# Patient Record
Sex: Female | Born: 1980
Health system: Southern US, Community
[De-identification: ages and names within clinical notes are randomized; demographics above are authoritative.]

## PROBLEM LIST (undated history)

## (undated) ENCOUNTER — Inpatient Hospital Stay (HOSPITAL_COMMUNITY): Payer: Self-pay

## (undated) DIAGNOSIS — D649 Anemia, unspecified: Secondary | ICD-10-CM

## (undated) DIAGNOSIS — R9431 Abnormal electrocardiogram [ECG] [EKG]: Secondary | ICD-10-CM

## (undated) DIAGNOSIS — R768 Other specified abnormal immunological findings in serum: Secondary | ICD-10-CM

## (undated) DIAGNOSIS — F329 Major depressive disorder, single episode, unspecified: Secondary | ICD-10-CM

## (undated) DIAGNOSIS — K219 Gastro-esophageal reflux disease without esophagitis: Secondary | ICD-10-CM

## (undated) DIAGNOSIS — Q244 Congenital subaortic stenosis: Secondary | ICD-10-CM

## (undated) DIAGNOSIS — Z8759 Personal history of other complications of pregnancy, childbirth and the puerperium: Secondary | ICD-10-CM

## (undated) DIAGNOSIS — Z8619 Personal history of other infectious and parasitic diseases: Secondary | ICD-10-CM

## (undated) DIAGNOSIS — I10 Essential (primary) hypertension: Secondary | ICD-10-CM

## (undated) DIAGNOSIS — H9190 Unspecified hearing loss, unspecified ear: Secondary | ICD-10-CM

## (undated) DIAGNOSIS — Z8659 Personal history of other mental and behavioral disorders: Secondary | ICD-10-CM

## (undated) DIAGNOSIS — S322XXA Fracture of coccyx, initial encounter for closed fracture: Secondary | ICD-10-CM

## (undated) DIAGNOSIS — R011 Cardiac murmur, unspecified: Secondary | ICD-10-CM

## (undated) DIAGNOSIS — Z8776 Personal history of (corrected) congenital malformations of integument, limbs and musculoskeletal system: Secondary | ICD-10-CM

## (undated) DIAGNOSIS — H9192 Unspecified hearing loss, left ear: Secondary | ICD-10-CM

## (undated) DIAGNOSIS — F32A Depression, unspecified: Secondary | ICD-10-CM

## (undated) HISTORY — DX: Abnormal electrocardiogram (ECG) (EKG): R94.31

## (undated) HISTORY — DX: Depression, unspecified: F32.A

## (undated) HISTORY — DX: Unspecified hearing loss, left ear: H91.92

## (undated) HISTORY — DX: Personal history of other mental and behavioral disorders: Z86.59

## (undated) HISTORY — DX: Personal history of other infectious and parasitic diseases: Z86.19

## (undated) HISTORY — DX: Cardiac murmur, unspecified: R01.1

## (undated) HISTORY — DX: Major depressive disorder, single episode, unspecified: F32.9

## (undated) HISTORY — DX: Other specified abnormal immunological findings in serum: R76.8

## (undated) HISTORY — PX: CARDIAC SURGERY: SHX584

## (undated) HISTORY — DX: Essential (primary) hypertension: I10

## (undated) HISTORY — PX: EYE SURGERY: SHX253

## (undated) HISTORY — DX: Personal history of other complications of pregnancy, childbirth and the puerperium: Z87.59

## (undated) HISTORY — DX: Gastro-esophageal reflux disease without esophagitis: K21.9

## (undated) HISTORY — DX: Personal history of (corrected) congenital malformations of integument, limbs and musculoskeletal system: Z87.76

---
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
## 2001-06-10 ENCOUNTER — Encounter: Admission: RE | Admit: 2001-06-10 | Discharge: 2001-06-10 | Payer: Self-pay | Admitting: *Deleted

## 2001-06-10 ENCOUNTER — Ambulatory Visit (HOSPITAL_COMMUNITY): Admission: RE | Admit: 2001-06-10 | Discharge: 2001-06-10 | Payer: Self-pay | Admitting: *Deleted

## 2001-06-10 ENCOUNTER — Encounter: Payer: Self-pay | Admitting: *Deleted

## 2001-08-19 ENCOUNTER — Ambulatory Visit (HOSPITAL_COMMUNITY): Admission: RE | Admit: 2001-08-19 | Discharge: 2001-08-19 | Payer: Self-pay | Admitting: *Deleted

## 2002-08-16 ENCOUNTER — Encounter: Payer: Self-pay | Admitting: *Deleted

## 2002-08-16 ENCOUNTER — Encounter: Admission: RE | Admit: 2002-08-16 | Discharge: 2002-08-16 | Payer: Self-pay | Admitting: *Deleted

## 2002-08-16 ENCOUNTER — Ambulatory Visit (HOSPITAL_COMMUNITY): Admission: RE | Admit: 2002-08-16 | Discharge: 2002-08-16 | Payer: Self-pay | Admitting: *Deleted

## 2007-08-31 ENCOUNTER — Ambulatory Visit: Payer: Self-pay | Admitting: Cardiovascular Disease

## 2007-09-21 ENCOUNTER — Ambulatory Visit: Payer: Self-pay

## 2007-09-21 ENCOUNTER — Encounter: Payer: Self-pay | Admitting: Cardiovascular Disease

## 2008-04-19 ENCOUNTER — Other Ambulatory Visit: Admission: RE | Admit: 2008-04-19 | Discharge: 2008-04-19 | Payer: Self-pay | Admitting: Family Medicine

## 2008-11-10 ENCOUNTER — Encounter (INDEPENDENT_AMBULATORY_CARE_PROVIDER_SITE_OTHER): Payer: Self-pay | Admitting: *Deleted

## 2008-12-27 DIAGNOSIS — R079 Chest pain, unspecified: Secondary | ICD-10-CM

## 2008-12-27 DIAGNOSIS — Z8679 Personal history of other diseases of the circulatory system: Secondary | ICD-10-CM

## 2008-12-27 DIAGNOSIS — R011 Cardiac murmur, unspecified: Secondary | ICD-10-CM

## 2008-12-28 ENCOUNTER — Ambulatory Visit: Payer: Self-pay | Admitting: Cardiovascular Disease

## 2008-12-28 DIAGNOSIS — R9431 Abnormal electrocardiogram [ECG] [EKG]: Secondary | ICD-10-CM

## 2008-12-28 HISTORY — DX: Abnormal electrocardiogram (ECG) (EKG): R94.31

## 2009-09-20 ENCOUNTER — Telehealth (INDEPENDENT_AMBULATORY_CARE_PROVIDER_SITE_OTHER): Payer: Self-pay | Admitting: *Deleted

## 2009-10-25 ENCOUNTER — Encounter (INDEPENDENT_AMBULATORY_CARE_PROVIDER_SITE_OTHER): Payer: Self-pay | Admitting: *Deleted

## 2010-07-09 NOTE — Letter (Signed)
Summary: Appointment - Reminder 2  [facility] [employer], Main Office  [address]   [place], [place] [address]   Phone: [phone]  Fax: [phone]     Oct 25, 2009 MRN: [id number]   [name] [address] [place], [place]  [address]   Dear [name],  Our records indicate that it is time to schedule a follow-up appointment with Dr. [name]. It is very important that we reach you to schedule this appointment. We look forward to participating in your health care needs. Please contact us at the number listed above at your earliest convenience to schedule your appointment.  If you are unable to make an appointment at this time, give [place] a call so we can update our records.     Sincerely,   Migdalia Dk [facility] Scheduling Team

## 2010-07-09 NOTE — Progress Notes (Signed)
  Faxed Echo over to [facility] in [place] to fax [phone],CB [phone] [facility]  September 20, 2009 2:39 PM

## 2010-10-08 ENCOUNTER — Telehealth: Payer: Self-pay | Admitting: Cardiovascular Disease

## 2010-10-08 NOTE — Telephone Encounter (Signed)
All Cardiac faxed to [facility] @ [phone]... 10/08/10/km

## 2010-10-22 NOTE — Assessment & Plan Note (Signed)
[facility]                            CARDIOLOGY OFFICE NOTE   [name], [name]                   MRN:          [id number]  DATE:08/31/2007                            DOB:          01/17/1981    Ms. [name] is a 30 year old patient I was asked to see by the front  test.  Apparently she had an appointment to see Dr. Diona Browner today. He  cancelled afternoon office, and the patient was in tears and wanted to  be seen.  She has a history of congenital heart disease and was  previously seen by Lorna Few. Apparently at the age of 47 she had  surgery for subaortic stenosis in [place].  Her mother will fax [place]  the records, but I do not have them now.   The patient has been doing fairly well. About a year ago she did have  some episodes of substernal chest pain that sounded atypical and  muscular. They passed after a week.  There was no associated  diaphoresis, shortness of breath, PND, orthopnea or palpitations.   The patient has not had an echocardiogram since 2003. At that time, EF  was 55-65%.  LV wall thickness was normal. Aortic valve was described as  trileaflet with normal excursion with no stenosis or regurgitation.   The patient has not seen a dentist in a while. Her dentition is in  fairly good shape.  There has been no previous history of SBE.   REVIEW OF SYSTEMS:  The patient's Review of Systems is otherwise  negative.   PAST MEDICAL HISTORY:  Remarkable for:  1. Strabismus surgery.  2. Tubes in her ear.  3. Subaortic stenosis surgery in 1996.   SOCIAL HISTORY:  The patient is a high Engineer, site teaching tenth  grade.  She is engaged to [place], whom she has known for about a year.  He  has an IT job in [employer].  There will be doing some commuting,  and she may move to [place].   She does not smoke or drink.  She is not very active.   FAMILY HISTORY:  Is remarkable for no congenital heart disease  previously. Mom and dad are both alive.  Mother is 32.  Father is 1.   MEDICATIONS:  She does not take any regular medication.   ALLERGIES:  The patient is allergic to CODEINE.   PHYSICAL EXAMINATION:  GENERAL:  Her exam is remarkable for an  overweight white female in no distress.  VITAL SIGNS:  Blood pressure is 130/70, pulse 62 and regular, afebrile,  respiratory rate 14.  HEENT:  Unremarkable.  NECK:  Carotids are without bruit, no lymphadenopathy, thyromegaly or  JVP elevation.  LUNGS:  Clear with good diaphragmatic motion.  No wheezing.  CARDIAC:  There is an S1-S2 with a faint systolic murmur in the left  lateral sternal border.  There is no aortic insufficiency murmur.  PMI  is normal.  Sternum is well healed.  ABDOMEN:  Benign.  Bowel sounds positive.  No AAA, no tenderness, no  hepatosplenomegaly or hepatojugular reflux.  EXTREMITIES:  Distal  pulses intact, no edema.  NEUROLOGIC:  Nonfocal.  SKIN:  Warm and dry.  MUSCULOSKELETAL:  No muscular weakness.   EKG is normal.   IMPRESSION:  1. History of subaortic stenosis with a faint murmur.  I do not think      she has had any recurrence of this.  There is no obvious diastolic      murmur of aortic insufficiency.  She will have a 2-D      echocardiogram.  She will need to see a dentist on a regular basis.  2. Atypical chest pain, doubt significant problem. Seems to be      resolved, sounded musculoskeletal; p.r.n. anti-inflammatories.  3. Previous strabismus surgery, wearing glasses. Follow up with      ophthalmologist.  4. Previous frequent otitis with tubes. Hearing fine.  No evidence of      recurrence; p.r.n. follow up with ENT.   I told [name] that so long as her echocardiogram does not show  significant regurgitation of the aortic valve or regrowth of her  subaortic membrane, that she could be seen in a year.  We will try to  find her a cardiologist in the [facility] area if she ends up  moving out there  with her fiance. However, I am encouraged that her exam  is fairly benign at this time. 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     [name]. [name], MD, [facility]  Electronically Signed    PCN/MedQ  DD: 08/31/2007  DT: 08/31/2007  Job #: [id number]

## 2011-04-24 ENCOUNTER — Emergency Department
Admission: EM | Admit: 2011-04-24 | Discharge: 2011-04-24 | Disposition: A | Discharge status: Home / Self Care | Payer: 59 | Source: Home / Self Care | Attending: Family Medicine | Admitting: Family Medicine

## 2011-04-24 ENCOUNTER — Encounter: Payer: Self-pay | Admitting: *Deleted

## 2011-04-24 DIAGNOSIS — R6889 Other general symptoms and signs: Secondary | ICD-10-CM

## 2011-04-24 DIAGNOSIS — IMO0001 Reserved for inherently not codable concepts without codable children: Secondary | ICD-10-CM

## 2011-04-24 DIAGNOSIS — J111 Influenza due to unidentified influenza virus with other respiratory manifestations: Secondary | ICD-10-CM

## 2011-04-24 DIAGNOSIS — M791 Myalgia, unspecified site: Secondary | ICD-10-CM

## 2011-04-24 DIAGNOSIS — R5383 Other fatigue: Secondary | ICD-10-CM

## 2011-04-24 DIAGNOSIS — R5381 Other malaise: Secondary | ICD-10-CM

## 2011-04-24 HISTORY — DX: Congenital subaortic stenosis: Q24.4

## 2011-04-24 HISTORY — DX: Unspecified hearing loss, unspecified ear: H91.90

## 2011-04-24 LAB — POCT CBC W AUTO DIFF (K'VILLE URGENT CARE)

## 2011-04-24 LAB — POCT INFLUENZA A/B
Influenza A, POC: NEGATIVE
Influenza B, POC: NEGATIVE

## 2011-04-24 NOTE — ED Notes (Signed)
Pt c/o HA x 3 days and joint pain,nausea and chills x 1 day. She has taken IBF and Excedrin, IBF helped with the joint pain.

## 2011-04-24 NOTE — ED Provider Notes (Addendum)
History     CSN: [id number] Arrival date & time: 04/24/2011  2:25 PM   First MD Initiated Contact with Patient 04/24/11 1425      Chief Complaint  Patient presents with  . Headache  . Generalized Body Aches      HPI Comments: Patient complains of onset of headache 3 days ago, followed yesterday by fatigue, chills, and myalgias.  Today she noted arthralgias, improved with ibuprofen.  She denies URI symptoms, GU symptoms.  She has had mild nausea without vomiting or diarrhea.  Last menstrual period normal about two weeks ago. She has not had a flu shot this season.  The history is provided by the patient.    Past Medical History  Diagnosis Date  . Subaortic stenosis     repaired  . Deafness     LT ear since birth     Past Surgical History  Procedure Date  . Cardiac surgery     open heart surgery   . Abdominal surgery     c-section    History reviewed. No pertinent family history.  History  Substance Use Topics  . Smoking status: Never Smoker   . Smokeless tobacco: Not on file  . Alcohol Use: Yes     1 q mth    OB History    Grav Para Term Preterm Abortions TAB SAB Ect Mult Living                  Review of Systems  Constitutional: Positive for chills and fatigue. Negative for fever.  Eyes: Negative.   Respiratory: Negative.   Cardiovascular: Negative.   Gastrointestinal: Positive for nausea. Negative for vomiting and diarrhea.  Genitourinary: Negative.   Musculoskeletal: Positive for arthralgias. Negative for joint swelling.  Skin: Negative.   Neurological: Positive for dizziness and headaches.  Hematological: Negative for adenopathy.    Allergies  Codeine  Home Medications  No current outpatient prescriptions on file.  BP 86/60  Pulse 87  Temp(Src) 98.1 F (36.7 C) (Oral)  Resp 18  Ht 5\' 3"  (1.6 m)  Wt 249 lb 4 oz (113.059 kg)  BMI 44.15 kg/m2  SpO2 100%  LMP 04/10/2011  Physical Exam  Constitutional: She is oriented to person,  place, and time. She appears well-developed and well-nourished. No distress.       Obese  HENT:  Head: Normocephalic and atraumatic.  Right Ear: External ear normal.  Nose: Nose normal.  Mouth/Throat: Oropharynx is clear and moist.       Congenital absence left ear canal.  Eyes: Conjunctivae and EOM are normal. Pupils are equal, round, and reactive to light. Right eye exhibits no discharge. Left eye exhibits no discharge.  Neck: Normal range of motion. Neck supple.  Cardiovascular: Normal rate, regular rhythm and normal heart sounds.   Pulmonary/Chest: Breath sounds normal. She is in respiratory distress. She has no wheezes. She has no rales. She exhibits tenderness.       Chest:  Distinct tenderness to palpation over the mid-sternum.   Abdominal: Soft. Bowel sounds are normal. She exhibits no distension. There is no tenderness. There is no guarding.  Musculoskeletal: She exhibits no edema.  Lymphadenopathy:    She has no cervical adenopathy.  Neurological: She is alert and oriented to person, place, and time.  Skin: Skin is warm and dry.    ED Course  Procedures     Labs Reviewed  POCT INFLUENZA A/B Negative  POCT CBC W AUTO DIFF ([facility])  CBC:  WBC 4.4; LY 23.7; MO 6.1; GR 70.2; Hgb 11.9       1. Myalgia   2. Fatigue   3. Influenza-like illness       MDM  No evidence bacterial infection today.  Suspect viral URI Treat symptomatically for now:  Increase fluid intake, begin expectorant/decongestant, topical decongestant, saline nasal spray/saline irrigation, cough suppressant at bedtime. Recommend flu shot when well. Followup with PCP if not improving 7 to 10 days.         Donna Christen, MD 04/26/11 1610  Donna Christen, MD 04/26/11 9604  Donna Christen, MD 04/26/11 2226

## 2011-11-19 ENCOUNTER — Ambulatory Visit (INDEPENDENT_AMBULATORY_CARE_PROVIDER_SITE_OTHER): Payer: 59 | Admitting: Emergency Medicine

## 2011-11-19 VITALS — BP 113/71 | HR 79 | Temp 98.4°F | Resp 18 | Ht 64.0 in | Wt 257.0 lb

## 2011-11-19 DIAGNOSIS — M6110 Myositis ossificans progressiva, unspecified site: Secondary | ICD-10-CM

## 2011-11-19 DIAGNOSIS — M79609 Pain in unspecified limb: Secondary | ICD-10-CM

## 2011-11-19 DIAGNOSIS — M722 Plantar fascial fibromatosis: Secondary | ICD-10-CM

## 2011-11-19 DIAGNOSIS — M79671 Pain in right foot: Secondary | ICD-10-CM

## 2011-11-19 NOTE — Patient Instructions (Addendum)

## 2011-11-19 NOTE — Progress Notes (Signed)
  Subjective:    Patient ID: [name], female    DOB: 1980/11/17, 31 y.o.   MRN: [id number]  HPI patient enters with pain and swelling of her right foot. She has a lot of pain in her heel with weightbearing through [place] and now this morning is unable to bear weight. She is currently [redacted] weeks pregnant.    Review of Systems she is approximately [redacted] weeks pregnant     Objective:   Physical Exam  There is tenderness over the calcaneus there is tenderness all along the arch of the foot. Is also swelling present over the calcaneus the.      Assessment & Plan:  I suspect this is a plantar fasciitis or worsened by her long walking yesterday. Advised her to get some shoes with good arch support we are going make sure she has crutches she will treated with ice and Tylenol because she is pregnant

## 2011-12-19 LAB — OB RESULTS CONSOLE ABO/RH

## 2011-12-19 LAB — OB RESULTS CONSOLE ANTIBODY SCREEN: Antibody Screen: NEGATIVE

## 2011-12-19 LAB — OB RESULTS CONSOLE HEPATITIS B SURFACE ANTIGEN: Hepatitis B Surface Ag: NEGATIVE

## 2011-12-19 LAB — OB RESULTS CONSOLE RUBELLA ANTIBODY, IGM: Rubella: IMMUNE

## 2011-12-26 ENCOUNTER — Encounter: Payer: Self-pay | Admitting: Certified Nurse Midwife

## 2011-12-26 LAB — US OB TRANSVAGINAL

## 2012-01-05 ENCOUNTER — Other Ambulatory Visit: Payer: Self-pay

## 2012-01-06 ENCOUNTER — Encounter (HOSPITAL_COMMUNITY): Payer: Self-pay | Admitting: Certified Nurse Midwife

## 2012-01-13 ENCOUNTER — Ambulatory Visit (HOSPITAL_COMMUNITY)
Admission: RE | Admit: 2012-01-13 | Discharge: 2012-01-13 | Disposition: A | Discharge status: Home / Self Care | Payer: 59 | Source: Ambulatory Visit | Attending: Certified Nurse Midwife | Admitting: Certified Nurse Midwife

## 2012-01-13 ENCOUNTER — Encounter (HOSPITAL_COMMUNITY): Payer: 59

## 2012-01-13 ENCOUNTER — Encounter (HOSPITAL_COMMUNITY): Payer: Self-pay

## 2012-01-13 DIAGNOSIS — Z8679 Personal history of other diseases of the circulatory system: Secondary | ICD-10-CM

## 2012-01-13 NOTE — Progress Notes (Signed)
MATERNAL FETAL MEDICINE CONSULT  Patient Name: [name] Medical Record Number:  [id number] Date of Birth: [birth year] Requesting Physician Name:  Marlinda Mike, CNM Date of Service: 01/13/2012  Chief Complaint Anti-Fya antibodies  History of Present Illness [name] was seen today secondary to anti-Fya alloimmunization at the request of Marlinda Mike, CNM.  The patient is a 31 y.o. G1P0,at [redacted]w[redacted]d with an EDD of 07/20/2012, Alternate EDD Entry dating method.  She was found to have an anti-Fya antibody titer of 2 on her initial prenatal labs.  She also had anti-Fya antibodies at the beginning of her last pregnancy.  Her husband has been tested and found to have a Fya/Fyb genotype.  An amniocentesis was performed in that pregnancy and her son was Fyb/Fyb indicated no risk of hemolytic disease of the newborn.    [name] was born with several congenital anomalies including left sided facial abnormalities, left sided conductive deafness, subaortic stenosis, and hip dysplasia.  Her husband has unilateral renal agenesis and an isolated patch of ichthyosis on his left leg.  She also had genetic counseling today regarding these issues.  Review of Systems Pertinent items are noted in HPI.  Patient History OB History    Grav Para Term Preterm Abortions TAB SAB Ect Mult Living   2 1 1             # Outc Date GA Lbr Len/2nd Wgt Sex Del Anes PTL Lv   1 TRM 2011 [redacted]w[redacted]d    LTCS      2 CUR               Past Medical History  Diagnosis Date  . Subaortic stenosis     repaired  . Deafness     LT ear since birth     Past Surgical History  Procedure Date  . Cardiac surgery     open heart surgery   . Abdominal surgery     c-section    History   Social History  . Marital Status: Married    Spouse Name: N/A    Number of Children: N/A  . Years of Education: N/A   Social History Main Topics  . Smoking status: Never Smoker   . Smokeless tobacco: Not on file  . Alcohol Use:  Yes     1 q mth  . Drug Use: No  . Sexually Active:    Other Topics Concern  . Not on file   Social History Narrative  . No narrative on file    Family History See HPI.  Physical Examination Vitals:  Pulse 39, BP 101/51, Weight 260 lbs. General appearance - alert, well appearing, and in no distress  Assessment and Recommendations 1.  Anti-Fya alloimmunization.  [name] has an anti-Fya antibody titer of 2 at this time.  As she was also positive for anti-Fya antibodies in the beginning of her last pregnancy, her sensitization likely occurred after a blood transfusion surrounding her open heart surgery at age 29.  As her husband has a single copy of the Fya gene, there is a 50% chance of maternal-fetal incompatibility at this locus.  We discussed the nature of alloimmunization, the potential for significant fetal anemia, amniocentesis for fetal Fy locus genotyping, the monitoring for anemia using MCA doppler, and the treatment of alloimmunization with fetal transfusion.  She had a bad experience surrounding her amniocentesis in her last pregnancy, and wishes to avoid one in this pregnancy if possible.  She wishes to proceed  with serial titers (at least monthly) at this time and will re-consider amniocentesis should her titers begin to rise.  If her titers rise to 16 or greater she will require frequent MCA doppler assessment thoughout pregnancy, beginning everyone other week.  Frequency should be increased to every week if MCA peak systolic velocities begin to increase, suggesting evolving anemia.  If her titers rise to 16 or above she should also begin once or twice weekly fetal testing beginning at 32 weeks, or earlier if evidence of anemia is discovered.  2.  Family History of Congenital Anomalies.  [name] and her husband met with our genetic counselor, Clydie Braun Corneliussen, to discuss these issues in detail.  They do not want to proceed with any genetic testing at this time.  For  full details of that visit please see accompanying documentation.  I spent 30 minutes with [name] today of which 50% was face-to-face counseling.  Rema Fendt, MD

## 2012-01-16 NOTE — Progress Notes (Signed)
Genetic Counseling  High-Risk Gestation Note  Appointment Date:  01/13/2012 Referred By: Marlinda Mike, CNM Date of Birth:  Sep 18, 1980     Pregnancy History: G2P1000 Estimated Date of Delivery: 07/20/12 Estimated Gestational Age: [redacted]w[redacted]d Attending: Rema Fendt, MD    Ms. [name] and her partner, Mr. Sammie Bench, were seen for genetic counseling because of a personal history of congenital anomalies for the patient. She was also seen for Maternal-Fetal Medicine consultation at the time of today's visit given anti-Fya antibodies.     Both family histories were reviewed and found to be contributory for the patient born with multiple congenital anomalies: subaortic stenosis, broken coccyx, hip dislocation, and left ear congenital deafness. She reported that she also has left eye problems, and had a skin tag on the side of her head. The subaortic stenosis was surgically repaired at 31 years of age. She is followed by cardiology every 2 years. She reported that an underlying etiology for her features has not been determined, and she has not had a medical genetics evaluation.  We discussed that each of Ms. Podgorski's features could be separate, isolated occurrences. However, a single underlying etiology is more likely suspected when there are findings affecting multiple body systems. The recurrence risk for the current pregnancy depends upon the specific underlying cause(s) for Ms. Santacroce's features. We discussed multiple possible etiologies including: a chromosome condition; a gene condition which could be sporadic, recessive, dominant, X-linked; a multifactorial cause; or a sporadic cause.  We spent time reviewing genes and chromosomes and various patterns of inheritance. We offered Ms. YULISA CHIRICO the option of a Medical Genetics evaluation for herself to evaluate for a possible underlying cause and recurrence risk assessment for relatives. Ms. Heatwole stated that her  physicians have previously discussed this option with her. She is undecided if this would be of benefit to her right now and declined a referral to Medical Genetics at this time.   Congenital heart defects occur in approximately 1% of pregnancies.  Congenital heart defects may occur due to multifactorial influences, chromosomal abnormalities, genetic syndromes or environmental exposures.  Isolated heart defects are generally multifactorial.  We discussed that recurrence risk for congenital heart defects in offspring of a women with apparently isolated aortic stenosis is approximately 13-18%. However, as discussed, Ms. Hatcher's subaortic stenosis would not likely be considered isolated, given that she had additional congenital findings. We reviewed that recurrence risk in the current pregnancy could range from low (in the case of an underlying sporadic etiology) to up to 50% (in the case of an underlying autosomal dominant condition). Additional information regarding the etiology or etiologies for Ms. Altman's features may alter recurrence risk assessment. In the absence of an identified genetic cause, prenatal diagnosis via amniocentesis would not be informative regarding the features present in Ms. Mcquerry. We discussed that targeted ultrasound and fetal echocardiogram to assess fetal growth, anatomy, and heart in detail are available in the second trimester of the current pregnancy. Targeted ultrasound was planned for 02/17/12. The couple understands that ultrasound cannot diagnose or rule out all birth defects or genetic conditions.   Additionally, the father of the pregnancy reported that he was born with only a right kidney, discovered incidentally at the time of a CT scan at age 37 years. He also reported that he has ichthyosis localized to his right foot and ankle. The unilateral renal agenesis and ichthyosis could be two separate, isolated occurrences, or could be due to a single underlying  etiology.  Renal agenesis (absent kidney) occurs in approximately 1 in 1000 births. It can be isolated or occur as part of an underlying condition. Unilateral renal agenesis may be asymptomatic and discovered incidentally, like Mr. Andrey Campanile. Typically renal agenesis occurs sporadically, though some familial cases have been reported. Thus, in the case of a sporadic occurrence, recurrence risk may be increased for the current pregnancy. We discussed that targeted ultrasound is available to evaluate fetal kidneys. We discussed that ichthyosis describes a group of skin disorders, typically genetic, with the feature of dry, scaling skin. We discussed that ichthyosis has been described to follow multiple forms of inheritance including autosomal dominant, X-linked recessive, and autosomal recessive. We again reviewed these inheritance patterns. In the case of an X-linked recessive form, all daughters of an affected individual would be carriers, and all sons would be unaffected and would not be carriers. For females who are carriers of an X-linked recessive condition, each of their pregnancies has a 1 in 4 chance of the following: 1) a son who is affected, 2) a son who is unaffected, 3) a daughter who is a carrier, 70) a daughter who is not a carrier. In the case of an autosomal dominant form of ichthyosis, each pregnancy would have a 1 in 2 (50%) chance to inherit the condition. In the case of an autosomal recessive form, all offspring of an affected individual would be obligate carriers, and the risk to inherit the condition would depend upon carrier status of the other parent. In the case of an autosomal recessive form for Mr. Andrey Campanile, recurrence risk for the current pregnancy would likely be low, given Ms. Lanese would have the general population carrier frequency (which varies with each condition) due to no known family history of ichthyosis for her and no consanguinity to Mr. Andrey Campanile. In the absence of an identified  cause, prenatal diagnosis via amniocentesis would not likely be informative regarding ichthyosis in the current pregnancy.    The father of the pregnancy also reported a female maternal first cousin with cleft lip. This individual is otherwise healthy. We discussed that cleft lip +/- cleft palate can be syndromic or isolated.  If the patient's relative has a syndromic form of clefting, the chance of having an affected child depends on the inheritance pattern of that condition.  If the patient's relative has an isolated form of clefting, we discussed the probable multifactorial inheritance and explained that genetic testing for isolated cleft lip +/- cleft palate is not currently available.  Based on the family history, this couple's chance to have a baby with an isolated cleft lip +/- cleft palate is not expected to increased above the general population risk. Without further information regarding the provided family history, an accurate genetic risk cannot be calculated. Further genetic counseling is warranted if more information is obtained.  We reviewed available screening options for the pregnancy including First screen, Quad screen, and detailed ultrasound. They understand that screening tests are used to modify a patient's a priori risk for aneuploidy, typically based on age.  This estimate provides a pregnancy specific risk assessment.  Ms. SHAUNIECE KWAN declined First trimester screening, stating that they were not interested in additional screening for fetal aneuploidy in the pregnancy. Given the family history, detailed ultrasound is available in the second trimester. This was scheduled for 02/17/2012.   Ms. NATURE KUEKER was provided with written information regarding cystic fibrosis (CF) including the carrier frequency and incidence in the Caucasian population, the availability of carrier testing  and prenatal diagnosis if indicated.  In addition, we discussed that CF is routinely screened  for as part of the [place] newborn screening panel.  She declined testing today.   Ms. HADIYA SPOERL denied exposure to environmental toxins or chemical agents. She denied the use of alcohol, tobacco or street drugs. She denied significant viral illnesses during the course of her pregnancy. Her medical and surgical histories were additionally contributory for anti Duffy antibodies. She was seen for Maternal-Fetal Medicine consultation at the time of today's visit regarding this history. Ms. Burback inquired about whether cell free fetal DNA testing was able to assess for fya/fyb (Duffy) status. Discussed that cell free fetal DNA testing assess for Rh status but is not currently available in the [place] to assess for other red blood cell antigens. Please see separately consult note for detailed discussion.    I counseled this couple regarding the above risks and available options.  The approximate face-to-face time with the genetic counselor was 30 minutes.  Quinn Plowman, MS,  Certified Genetic Counselor 01/19/2012

## 2012-02-17 ENCOUNTER — Ambulatory Visit (HOSPITAL_COMMUNITY)
Admission: RE | Admit: 2012-02-17 | Discharge: 2012-02-17 | Disposition: A | Discharge status: Home / Self Care | Payer: 59 | Source: Ambulatory Visit | Attending: Certified Nurse Midwife | Admitting: Certified Nurse Midwife

## 2012-02-17 ENCOUNTER — Other Ambulatory Visit: Payer: Self-pay

## 2012-02-17 DIAGNOSIS — O360990 Maternal care for other rhesus isoimmunization, unspecified trimester, not applicable or unspecified: Secondary | ICD-10-CM

## 2012-02-17 DIAGNOSIS — O361190 Maternal care for Anti-A sensitization, unspecified trimester, not applicable or unspecified: Secondary | ICD-10-CM | POA: Insufficient documentation

## 2012-02-17 DIAGNOSIS — Z8679 Personal history of other diseases of the circulatory system: Secondary | ICD-10-CM

## 2012-02-17 DIAGNOSIS — Z1389 Encounter for screening for other disorder: Secondary | ICD-10-CM | POA: Insufficient documentation

## 2012-02-17 DIAGNOSIS — O358XX0 Maternal care for other (suspected) fetal abnormality and damage, not applicable or unspecified: Secondary | ICD-10-CM | POA: Insufficient documentation

## 2012-02-17 DIAGNOSIS — O352XX0 Maternal care for (suspected) hereditary disease in fetus, not applicable or unspecified: Secondary | ICD-10-CM | POA: Insufficient documentation

## 2012-02-17 DIAGNOSIS — Z363 Encounter for antenatal screening for malformations: Secondary | ICD-10-CM | POA: Insufficient documentation

## 2012-02-17 NOTE — Progress Notes (Signed)
[name]  was seen today for an ultrasound appointment.  See full report in [facility].  Alpha Gula, MD  The patient was counseled about the ultrasound today. She was made aware of the technical limitations secondary to maternal obesity, and fetal position.  She was counseled again about allommunization, and the risk to the fetus. Risk, benefits, and alternatives regarding amniocentesis, serial titters, and MCA Doppler were reviewed. She declined amniocentesis today, with the option to reconsider at any time. She desires to have serial titters, with continued fetal surveillance in the form of MCA Doppler when applicable.   Single living IUP at 18 0/7 weeks. Normal growth for gestational age. Normal amniotic fluid index. Limited anatomy.  Recommend follow up ultrasound in 4 weeks.  Recommend fetal echocardiogram.

## 2012-03-02 ENCOUNTER — Other Ambulatory Visit: Payer: Self-pay

## 2012-03-10 ENCOUNTER — Other Ambulatory Visit: Payer: Self-pay

## 2012-03-18 ENCOUNTER — Ambulatory Visit (HOSPITAL_COMMUNITY)
Admission: RE | Admit: 2012-03-18 | Discharge: 2012-03-18 | Disposition: A | Discharge status: Home / Self Care | Payer: 59 | Source: Ambulatory Visit | Attending: Certified Nurse Midwife | Admitting: Certified Nurse Midwife

## 2012-03-18 DIAGNOSIS — Z363 Encounter for antenatal screening for malformations: Secondary | ICD-10-CM | POA: Insufficient documentation

## 2012-03-18 DIAGNOSIS — Z8679 Personal history of other diseases of the circulatory system: Secondary | ICD-10-CM

## 2012-03-18 DIAGNOSIS — Z1389 Encounter for screening for other disorder: Secondary | ICD-10-CM | POA: Insufficient documentation

## 2012-03-18 DIAGNOSIS — O352XX0 Maternal care for (suspected) hereditary disease in fetus, not applicable or unspecified: Secondary | ICD-10-CM | POA: Insufficient documentation

## 2012-03-18 DIAGNOSIS — O358XX0 Maternal care for other (suspected) fetal abnormality and damage, not applicable or unspecified: Secondary | ICD-10-CM | POA: Insufficient documentation

## 2012-03-18 DIAGNOSIS — O361190 Maternal care for Anti-A sensitization, unspecified trimester, not applicable or unspecified: Secondary | ICD-10-CM | POA: Insufficient documentation

## 2012-03-18 DIAGNOSIS — O360990 Maternal care for other rhesus isoimmunization, unspecified trimester, not applicable or unspecified: Secondary | ICD-10-CM

## 2012-03-18 NOTE — Progress Notes (Signed)
Ms. Earwood had an ultrasound appointment today.  Please see [facility] report for details.  Comments An active singleton fetus is observed.  Biometry is appropriate for gestational age.  Amniotic fluid volume is normal. Limited evaluation due to maternal body habitus and fetal position. I readdressed the concept of "critical titer" for antibody associated with alloimmunization.    Impression Active singleton fetus Screening survey of the fetal anatomy was performed and no dysmorphic features are detected on attempted comprehensive evaluation limited by maternal insonating characteristics and fetal position. Evaluation of the fetal heart (ductal arch, aortic arch), diaphragm, and spine are suboptimal and warrant follow up evaluation. 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 Follow up anatomy (ductal arch, aortic arch, spine, diaphragm) and interval growth evaluation by ultrasound was scheduled in 4 weeks 2. Fetal echocardiogram has been recommended and arranged. 3. 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  In my brief review of her electronic medical record, I do not see any evidence of her having one performed.  If there is any abnormality in her (maternal) echocardiogram, I strongly recommend reconsultation with MFM to discuss pregnancy management. 4. Continue monthly titers in context to present antibody that has yet to reach a critical titer. 5.  If a critical titer (at least 1:16) is reached, I recommend reconsultation with MFM and initiation of MCA Dopplers q 1-2 weeks, depending on the measured PSV.  Rogelia Boga, MD, MS, FACOG Assistant Professor Section of Maternal-Fetal Medicine [facility]

## 2012-03-30 ENCOUNTER — Other Ambulatory Visit: Payer: Self-pay | Admitting: Obstetrics & Gynecology

## 2012-04-01 ENCOUNTER — Emergency Department (HOSPITAL_COMMUNITY)
Admission: EM | Admit: 2012-04-01 | Discharge: 2012-04-02 | Disposition: A | Discharge status: Home / Self Care | Payer: 59 | Attending: Emergency Medicine | Admitting: Emergency Medicine

## 2012-04-01 ENCOUNTER — Encounter (HOSPITAL_COMMUNITY): Payer: Self-pay | Admitting: *Deleted

## 2012-04-01 DIAGNOSIS — R002 Palpitations: Secondary | ICD-10-CM | POA: Insufficient documentation

## 2012-04-01 DIAGNOSIS — Z9889 Other specified postprocedural states: Secondary | ICD-10-CM | POA: Insufficient documentation

## 2012-04-01 DIAGNOSIS — H9190 Unspecified hearing loss, unspecified ear: Secondary | ICD-10-CM | POA: Insufficient documentation

## 2012-04-01 NOTE — ED Notes (Signed)
Patient is alert and oriented x3.  She is complaining of palpitations and chest pressure that started yesterday. She has a past cardiac history.  She states that she feels a hard heart beat that she describes as painful  At a pain level of 6 of 10.  She states she does have nausea but also confirms that she is [redacted] weeks pregnant.   She has had this similar feeling but it has only lasted approximately 30 minutes.  Today it continues and did not subside

## 2012-04-02 LAB — CBC WITH DIFFERENTIAL/PLATELET
Basophils Absolute: 0 10*3/uL (ref 0.0–0.1)
Lymphocytes Relative: 25 % (ref 12–46)
Neutro Abs: 9.1 10*3/uL — ABNORMAL HIGH (ref 1.7–7.7)
Platelets: 270 10*3/uL (ref 150–400)
RDW: 14.3 % (ref 11.5–15.5)
WBC: 12.8 10*3/uL — ABNORMAL HIGH (ref 4.0–10.5)

## 2012-04-02 LAB — BASIC METABOLIC PANEL
Chloride: 98 mEq/L (ref 96–112)
GFR calc Af Amer: >90 mL/min (ref >90)
Potassium: 3.8 mEq/L (ref 3.5–5.1)

## 2012-04-02 LAB — TSH: TSH: 3.824 u[IU]/mL (ref 0.350–4.500)

## 2012-04-02 LAB — TROPONIN I: Troponin I: <0.3 ng/mL (ref <0.30)

## 2012-04-02 NOTE — ED Notes (Signed)
Pt states with each hard heart  Beat she also feels burning in mid chest but it also radiates up to right neck and ear area.

## 2012-04-02 NOTE — ED Provider Notes (Signed)
History     CSN: [id number]  Arrival date & time 04/01/12  2342   First MD Initiated Contact with Patient 04/02/12 0236      Chief Complaint  Patient presents with  . Palpitations    (Consider location/radiation/quality/duration/timing/severity/associated sxs/prior treatment) HPI This is a 31 year old female who is about [redacted] weeks pregnant. She has a history of surgically repaired subaortic stenosis. She is here with a one-day history of episodic palpitations. She describes the palpitations as a severe pounding of her heart which she feels as a burning sensation in her chest and into her neck. These episodes occur when she changes her activities such as when she becomes active or when she sits down or lies down to rest. The palpitations lasted several minutes the time. They are not associated with dyspnea, nausea or diaphoresis (she does have nausea associated with her pregnancy but this has not affected by the episodes of palpitations). She is asymptomatic at this time.  She states she's also been having Braxton Hicks contractions but these do not correlate with her palpitations.  Past Medical History  Diagnosis Date  . Subaortic stenosis     repaired  . Deafness     LT ear since birth     Past Surgical History  Procedure Date  . Cardiac surgery     open heart surgery   . Abdominal surgery     c-section    History reviewed. No pertinent family history.  History  Substance Use Topics  . Smoking status: Never Smoker   . Smokeless tobacco: Not on file  . Alcohol Use: Yes     1 q mth    OB History    Grav Para Term Preterm Abortions TAB SAB Ect Mult Living   2 1 1              Review of Systems  All other systems reviewed and are negative.    Allergies  Codeine  Home Medications   Current Outpatient Rx  Name Route Sig Dispense Refill  . PRENATAL MULTIVITAMIN CH Oral Take 1 tablet by mouth daily.      BP 120/59  Pulse 82  Temp 97.8 F (36.6 C) (Oral)   Resp 18  Ht 5\' 3"  (1.6 m)  Wt 264 lb (119.75 kg)  BMI 46.77 kg/m2  SpO2 100%  LMP 04/10/2011  Physical Exam General: Well-developed, well-nourished female in no acute distress; appearance consistent with age of record HENT: Mildly asymmetric fascies; atraumatic Eyes: pupils equal round and reactive to light; extraocular muscles intact Neck: supple Heart: regular rate and rhythm; no murmur Lungs: clear to auscultation bilaterally Abdomen: soft; nondistended; nontender; gravid, consistent with dates Extremities: No deformity; full range of motion; pulses normal; no edema Neurologic: Awake, alert and oriented; motor function intact in all extremities and symmetric; no facial droop Skin: Warm and dry Psychiatric: Normal mood and affect    ED Course  Procedures (including critical care time)     MDM   Nursing notes and vitals signs, including pulse oximetry, reviewed.  Summary of this visit's results, reviewed by myself:  Labs:  Results for orders placed during the hospital encounter of 04/01/12  CBC WITH DIFFERENTIAL      Component Value Range   WBC 12.8 (*) 4.0 - 10.5 K/uL   RBC 3.63 (*) 3.87 - 5.11 MIL/uL   Hemoglobin 10.5 (*) 12.0 - 15.0 g/dL   HCT 40.9 (*) 81.1 - 91.4 %   MCV 86.2  78.0 - 100.0  fL   MCH 28.9  26.0 - 34.0 pg   MCHC 33.5  30.0 - 36.0 g/dL   RDW 16.1  09.6 - 04.5 %   Platelets 270  150 - 400 K/uL   Neutrophils Relative 71  43 - 77 %   Neutro Abs 9.1 (*) 1.7 - 7.7 K/uL   Lymphocytes Relative 25  12 - 46 %   Lymphs Abs 3.2  0.7 - 4.0 K/uL   Monocytes Relative 4  3 - 12 %   Monocytes Absolute 0.5  0.1 - 1.0 K/uL   Eosinophils Relative 0  0 - 5 %   Eosinophils Absolute 0.1  0.0 - 0.7 K/uL   Basophils Relative 0  0 - 1 %   Basophils Absolute 0.0  0.0 - 0.1 K/uL  TROPONIN I      Component Value Range   Troponin I <0.30  <0.30 ng/mL  BASIC METABOLIC PANEL      Component Value Range   Sodium 130 (*) 135 - 145 mEq/L   Potassium 3.8  3.5 - 5.1 mEq/L     Chloride 98  96 - 112 mEq/L   CO2 20  19 - 32 mEq/L   Glucose, Bld 100 (*) 70 - 99 mg/dL   BUN 10  6 - 23 mg/dL   Creatinine, Ser 4.09 (*) 0.50 - 1.10 mg/dL   Calcium 9.4  8.4 - 81.1 mg/dL   GFR calc non Af Amer >90  >90 mL/min   GFR calc Af Amer >90  >90 mL/min    EKG Interpretation:  Date & Time: 04/02/2012 2:37 AM  Rate: 80  Rhythm: normal sinus rhythm  QRS Axis: normal  Intervals: normal  ST/T Wave abnormalities: normal  Conduction Disutrbances:none  Narrative Interpretation:   Old EKG Reviewed: Q wave in lead 3 not seen previously  5:03 AM Asymptomatic in ED. Patient advised that TSH is pending and her OB/GYN should followup on this.        Hanley Seamen, MD 04/02/12 [phone]

## 2012-04-02 NOTE — ED Notes (Signed)
Pt has had episodes of strong heart beat in the past but has become more frequent in the past week. Pt states yesterday it began to feel worse. She states [redacted] weeks pregnant and is also having Braxton Hicks  Contractions and along with the strong heart beat it is making her nervous.  Pt states this is not her first pregnancy and with the previous pregnancy had palpitations but this is different.

## 2012-04-09 ENCOUNTER — Ambulatory Visit (INDEPENDENT_AMBULATORY_CARE_PROVIDER_SITE_OTHER): Payer: 59 | Admitting: Internal Medicine

## 2012-04-09 ENCOUNTER — Ambulatory Visit (HOSPITAL_COMMUNITY): Payer: 59 | Attending: Cardiology | Admitting: Radiology

## 2012-04-09 ENCOUNTER — Other Ambulatory Visit: Payer: Self-pay

## 2012-04-09 VITALS — BP 108/56 | HR 98 | Ht 63.0 in | Wt 265.0 lb

## 2012-04-09 DIAGNOSIS — R011 Cardiac murmur, unspecified: Secondary | ICD-10-CM | POA: Insufficient documentation

## 2012-04-09 DIAGNOSIS — R079 Chest pain, unspecified: Secondary | ICD-10-CM

## 2012-04-09 DIAGNOSIS — R5383 Other fatigue: Secondary | ICD-10-CM | POA: Insufficient documentation

## 2012-04-09 DIAGNOSIS — R072 Precordial pain: Secondary | ICD-10-CM

## 2012-04-09 DIAGNOSIS — Z331 Pregnant state, incidental: Secondary | ICD-10-CM | POA: Insufficient documentation

## 2012-04-09 DIAGNOSIS — I517 Cardiomegaly: Secondary | ICD-10-CM | POA: Insufficient documentation

## 2012-04-09 DIAGNOSIS — R5381 Other malaise: Secondary | ICD-10-CM | POA: Insufficient documentation

## 2012-04-09 LAB — BASIC METABOLIC PANEL
Chloride: 104 mEq/L (ref 96–112)
Creatinine, Ser: 0.5 mg/dL (ref 0.4–1.2)
Potassium: 4.3 mEq/L (ref 3.5–5.1)

## 2012-04-09 NOTE — Progress Notes (Signed)
Echocardiogram performed by Missy Al-Rammal

## 2012-04-09 NOTE — Progress Notes (Signed)
HPI Patient is a 31 yo who is referred for evaluation of CP She is now in her 2nd pregnancy.  First pregnancy in 2011 she reports having no problems. On 10/24 she says that is when she began to have problems with CP and palpitations.  No dizziness.  Is more SOB  Feels worse when she lies down.  Denies brackish taste in mouth.  Seen in Urgent care and sent home.   She says she doesn't necessarily feel heart beat faster  Just harder.  Symptoms have continued since that visit. She says she does get more SOB with activity now  She has a histroy of congenital Subarortic stenosis.  Corrected in 1996.  Last seen by Dr. Candis Musa in the 200s.   Allergies  Allergen Reactions  . Codeine Hives    Current Outpatient Prescriptions  Medication Sig Dispense Refill  . acetaminophen (TYLENOL) 500 MG tablet Take 500 mg by mouth. As needed for pain      . calcium carbonate (TUMS - DOSED IN MG ELEMENTAL CALCIUM) 500 MG chewable tablet As needed      . ondansetron (ZOFRAN) 4 MG tablet As needed      . Prenatal Vit-Fe Fumarate-FA (PRENATAL MULTIVITAMIN) TABS Take 1 tablet by mouth daily.        Past Medical History  Diagnosis Date  . Subaortic stenosis     repaired  . Deafness     LT ear since birth     Past Surgical History  Procedure Date  . Cardiac surgery     open heart surgery   . Abdominal surgery     c-section    No family history on file.  History   Social History  . Marital Status: Married    Spouse Name: N/A    Number of Children: N/A  . Years of Education: N/A   Occupational History  . Not on file.   Social History Main Topics  . Smoking status: Never Smoker   . Smokeless tobacco: Not on file  . Alcohol Use: Yes     1 q mth  . Drug Use: No  . Sexually Active:    Other Topics Concern  . Not on file   Social History Narrative  . No narrative on file    Review of Systems:  All systems reviewed.  They are negative to the above problem except as previously  stated.  Vital Signs: BP 108/56  Pulse 98  Ht 5\' 3"  (1.6 m)  Wt 265 lb (120.203 kg)  BMI 46.94 kg/m2  LMP 04/10/2011  Physical Exam Patient is in NAD HEENT:  Normocephalic, atraumatic. EOMI, PERRLA.  Neck: JVP is normal.  No bruits.  Lungs: clear to auscultation. No rales no wheezes.  Heart: Regular rate and rhythm. Normal S1, S2. No S3.  Gr II/VI systolic murmur at LSB to base. PMI not displaced.  Abdomen:  Distended  Supple, nontender. Normal bowel sounds.No hepatomegaly.  Extremities:   Good distal pulses throughout. No lower extremity edema.  Musculoskeletal :moving all extremities.  Neuro:   alert and oriented x3.  CN II-XII grossly intact.  EKG:  SR 85.   Assessment and Plan:  1.  CP  I am not sure what this represents  IT is often with a heavier heart beat.  I am not convinced discomfort is cardiac in origin.  Question GI   I would recomm an echo to define LVOT and AV and LVEF.  I have also recomm that she take  an acid inhibitor empirically.  2.  Congenital heart disease  Recomm echo.    Take actvity as tolerated. Stay hydrated  Rest.

## 2012-04-09 NOTE — Patient Instructions (Addendum)
Your physician has requested that you have an echocardiogram. Echocardiography is a painless test that uses sound waves to create images of your heart. It provides your doctor with information about the size and shape of your heart and how well your heart's chambers and valves are working. This procedure takes approximately one hour. There are no restrictions for this procedure.  Lab work today We will call you with results. 

## 2012-04-15 ENCOUNTER — Other Ambulatory Visit (HOSPITAL_COMMUNITY): Payer: Self-pay | Admitting: Obstetrics and Gynecology

## 2012-04-15 ENCOUNTER — Other Ambulatory Visit (HOSPITAL_COMMUNITY): Payer: Self-pay | Admitting: Certified Nurse Midwife

## 2012-04-15 DIAGNOSIS — Z8679 Personal history of other diseases of the circulatory system: Secondary | ICD-10-CM

## 2012-04-16 ENCOUNTER — Other Ambulatory Visit (HOSPITAL_COMMUNITY): Payer: Self-pay | Admitting: Certified Nurse Midwife

## 2012-04-16 DIAGNOSIS — Z8679 Personal history of other diseases of the circulatory system: Secondary | ICD-10-CM

## 2012-04-19 ENCOUNTER — Ambulatory Visit (HOSPITAL_COMMUNITY)
Admission: RE | Admit: 2012-04-19 | Discharge: 2012-04-19 | Disposition: A | Discharge status: Home / Self Care | Payer: 59 | Source: Ambulatory Visit | Attending: Certified Nurse Midwife | Admitting: Certified Nurse Midwife

## 2012-04-19 VITALS — BP 114/59 | HR 85 | Wt 266.5 lb

## 2012-04-19 DIAGNOSIS — O361190 Maternal care for Anti-A sensitization, unspecified trimester, not applicable or unspecified: Secondary | ICD-10-CM | POA: Insufficient documentation

## 2012-04-19 DIAGNOSIS — O352XX0 Maternal care for (suspected) hereditary disease in fetus, not applicable or unspecified: Secondary | ICD-10-CM | POA: Insufficient documentation

## 2012-04-19 DIAGNOSIS — Z8679 Personal history of other diseases of the circulatory system: Secondary | ICD-10-CM

## 2012-04-19 NOTE — Progress Notes (Signed)
STASHA NARAINE  was seen today for an ultrasound appointment.  See full report in [facility].  Comments: Ms. 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  The patient has a personal history of subaortic stenosis which was repaired as a child.  She has a scheduled fetal echo today.  Impression: Single IUP at 26 6/7 weeks Interval growth is appropriate (56th %tile) Normal anatomic fetal survey; however, somewhat limited views of the spine and heart were obtained (ductus, aortic arch) Norma amniotic fluid volume  Recommendations: Recommend continued monthly Fya titers.  Will need to begin MCA Doppler studies if Fya titer > 16. Fetal echo today Recommend follow up ultrasound in 4 weeks.  Alpha Gula, MD

## 2012-04-21 NOTE — Consult Note (Signed)
Dear  Colleagues,  I had the pleasure of seeing [name] for initial consultation in the Fetal Heart Program at [facility]. As you know, she is a 31 y/o G2P1 currently at 27 weeks 0 days gestation referred for fetal cardiovascular evaluation secondary to presence of congenital heart defect in mother.  [name] has history of subaortic stenosis that required surgical repair during her childhood.  But it has not recurred and she had a recent evaluation from her cardiologist showing no current adverse hemodynamic issues.  This pregnancy was conceived naturally. There has been no indication for amniocentesis. She has not experienced any significant illnesses or hospitalizations. She is on prenatal vitamins. She denies any exposure to tobacco, alcohol or recreational drugs. She denies any complaints today.  All systems reviewed and negative.  Aside from [name]'s congenital defect, there is no family history of congenital heart disease, birth defects, genetic abnormalities, arrhythmia, pacemakers or sudden cardiac death.    A complete Fetal Echocardiogram with Doppler assessment was performed in our clinic today, which I personally interpreted and reviewed with [name].  Please refer to a copy of my final report in [facility] if you would like to review further details.  In summary, fetal cardiovascular anatomy and function were normal. No intracardiac abnormalities were identified.  Doppler patterns in the umbilical vessels, ductus venosus and middle cerebral artery were normal. Fetal heart rate and rhythm were also normal with no ectopy or arrhythmia identified.  In summary, I felt that this Fetal Echo was completely normal. These findings were discussed with [name].  I also told her that fetal echocardiography may not be able to detect minor abnormalities such as small ventricular septal defects, subtle valve abnormalities or mild coarctation of the aorta. Nor can it  predict postnatal persistence of normal fetal structures such as a patent foramen ovale or patent ductus arteriosus.  I do not feel that [name] requires a follow-up Fetal Echocardiogram in our [facility] Fetal Heart Clinic here at [facility].  But I am glad to see her back if you have any persistent concerns or if new clinical problems arise. I also do not feel that an echocardiogram is necessary after delivery unless indicated by clinical conditions. Please do not hesitate to contact me with any questions or concerns.  Thank you for involving me in the care of your patient today.   Sincerely,  Massie Bougie, MD Pediatric Cardiology [facility]  Total Time: 30 minues. Time of Counseling/Coordinating care: 20 minutes.

## 2012-04-23 ENCOUNTER — Institutional Professional Consult (permissible substitution): Payer: 59 | Admitting: Cardiovascular Disease

## 2012-05-19 ENCOUNTER — Encounter (HOSPITAL_COMMUNITY): Payer: Self-pay

## 2012-05-19 ENCOUNTER — Ambulatory Visit (HOSPITAL_COMMUNITY)
Admission: RE | Admit: 2012-05-19 | Discharge: 2012-05-19 | Disposition: A | Discharge status: Home / Self Care | Payer: 59 | Source: Ambulatory Visit | Attending: Certified Nurse Midwife | Admitting: Certified Nurse Midwife

## 2012-05-19 VITALS — BP 109/59 | HR 89 | Wt 264.5 lb

## 2012-05-19 DIAGNOSIS — O361190 Maternal care for Anti-A sensitization, unspecified trimester, not applicable or unspecified: Secondary | ICD-10-CM | POA: Insufficient documentation

## 2012-05-19 DIAGNOSIS — Z8679 Personal history of other diseases of the circulatory system: Secondary | ICD-10-CM

## 2012-05-19 DIAGNOSIS — O352XX0 Maternal care for (suspected) hereditary disease in fetus, not applicable or unspecified: Secondary | ICD-10-CM | POA: Insufficient documentation

## 2012-06-17 ENCOUNTER — Telehealth: Payer: Self-pay | Admitting: Internal Medicine

## 2012-06-17 NOTE — Telephone Encounter (Signed)
Spoke with French Ana at OB/GYN office. Patient is [redacted] weeks pregnant. They want to know if  #1 Needs SBE during delivery ? #2 Can she attempt a regular vaginal delivery ( will this place too much strain on her heart)?  Advised will ask Dr.Ross and call her back tomorrow.

## 2012-06-17 NOTE — Telephone Encounter (Signed)
Left message for call back.

## 2012-06-17 NOTE — Telephone Encounter (Signed)
New Problem:    Called in with a question about the patient.  Please call back.

## 2012-06-21 NOTE — Telephone Encounter (Signed)
Please fax to [facility] at [facility]  Send with  report of recent echo 1.  Patient does not need antibiotic prophylaxis  2.  Echo with normal LV function.  No signif ouflow obstruction from LV  AV without stenosis. Patient with no cardiac limitation for normal vaginal delivery.

## 2012-06-22 NOTE — Telephone Encounter (Signed)
I will forward this message to Dr Tenny Craw to review and make further recommendations.

## 2012-06-22 NOTE — Telephone Encounter (Signed)
Left message for tracy to call

## 2012-06-22 NOTE — Telephone Encounter (Signed)
Spoke with tracy at [facility]. Aware of dr Tenny Craw recommendations. Will fax to [phone].

## 2012-07-22 ENCOUNTER — Telehealth (HOSPITAL_COMMUNITY): Payer: Self-pay | Admitting: *Deleted

## 2012-07-22 ENCOUNTER — Encounter (HOSPITAL_COMMUNITY): Payer: Self-pay | Admitting: *Deleted

## 2012-07-22 NOTE — Telephone Encounter (Signed)
Preadmission screen  

## 2012-07-23 ENCOUNTER — Telehealth (HOSPITAL_COMMUNITY): Payer: Self-pay | Admitting: *Deleted

## 2012-07-23 NOTE — Telephone Encounter (Signed)
Preadmission screen  

## 2012-07-24 ENCOUNTER — Other Ambulatory Visit: Payer: Self-pay

## 2012-07-28 ENCOUNTER — Inpatient Hospital Stay (HOSPITAL_COMMUNITY)
Admission: AD | Admit: 2012-07-28 | Discharge: 2012-07-30 | DRG: 765 | Disposition: A | Discharge status: Home / Self Care | Payer: 59 | Source: Ambulatory Visit | Attending: Obstetrics and Gynecology | Admitting: Obstetrics and Gynecology

## 2012-07-28 ENCOUNTER — Inpatient Hospital Stay (HOSPITAL_COMMUNITY): Payer: 59 | Admitting: Anesthesiology

## 2012-07-28 ENCOUNTER — Other Ambulatory Visit: Payer: Self-pay | Admitting: Obstetrics and Gynecology

## 2012-07-28 ENCOUNTER — Encounter (HOSPITAL_COMMUNITY)
Admission: AD | Disposition: A | Discharge status: Home / Self Care | Payer: Self-pay | Source: Ambulatory Visit | Attending: Obstetrics and Gynecology

## 2012-07-28 ENCOUNTER — Encounter (HOSPITAL_COMMUNITY): Payer: Self-pay | Admitting: General Surgery

## 2012-07-28 ENCOUNTER — Encounter (HOSPITAL_COMMUNITY): Payer: Self-pay | Admitting: Anesthesiology

## 2012-07-28 DIAGNOSIS — R079 Chest pain, unspecified: Secondary | ICD-10-CM

## 2012-07-28 DIAGNOSIS — R5383 Other fatigue: Secondary | ICD-10-CM

## 2012-07-28 DIAGNOSIS — D62 Acute posthemorrhagic anemia: Secondary | ICD-10-CM | POA: Diagnosis present

## 2012-07-28 DIAGNOSIS — R9431 Abnormal electrocardiogram [ECG] [EKG]: Secondary | ICD-10-CM

## 2012-07-28 DIAGNOSIS — O34219 Maternal care for unspecified type scar from previous cesarean delivery: Secondary | ICD-10-CM | POA: Diagnosis present

## 2012-07-28 DIAGNOSIS — Z8679 Personal history of other diseases of the circulatory system: Secondary | ICD-10-CM

## 2012-07-28 DIAGNOSIS — O3660X0 Maternal care for excessive fetal growth, unspecified trimester, not applicable or unspecified: Secondary | ICD-10-CM | POA: Diagnosis present

## 2012-07-28 DIAGNOSIS — O9902 Anemia complicating childbirth: Secondary | ICD-10-CM | POA: Diagnosis present

## 2012-07-28 DIAGNOSIS — R011 Cardiac murmur, unspecified: Secondary | ICD-10-CM

## 2012-07-28 DIAGNOSIS — O4100X0 Oligohydramnios, unspecified trimester, not applicable or unspecified: Principal | ICD-10-CM | POA: Diagnosis present

## 2012-07-28 LAB — RPR: RPR Ser Ql: NONREACTIVE

## 2012-07-28 LAB — CBC
HCT: 33.4 % — ABNORMAL LOW (ref 36.0–46.0)
Hemoglobin: 10.6 g/dL — ABNORMAL LOW (ref 12.0–15.0)
MCH: 26.8 pg (ref 26.0–34.0)
MCHC: 31.7 g/dL (ref 30.0–36.0)
MCV: 84.6 fL (ref 78.0–100.0)
RBC: 3.95 MIL/uL (ref 3.87–5.11)

## 2012-07-28 SURGERY — Surgical Case
Anesthesia: Spinal | Site: Abdomen | Wound class: Clean Contaminated

## 2012-07-28 MED ORDER — OXYTOCIN 10 UNIT/ML IJ SOLN
40.0000 [IU] | INTRAVENOUS | Status: DC | PRN
  Administered 2012-07-28: 40 [IU] via INTRAVENOUS

## 2012-07-28 MED ORDER — SIMETHICONE 80 MG PO CHEW: 80.0000 mg | CHEWABLE_TABLET | ORAL | Status: DC | PRN

## 2012-07-28 MED ORDER — ONDANSETRON HCL 4 MG/2ML IJ SOLN: 4.0000 mg | Freq: Three times a day (TID) | INTRAMUSCULAR | Status: DC | PRN

## 2012-07-28 MED ORDER — EPHEDRINE SULFATE 50 MG/ML IJ SOLN
INTRAMUSCULAR | Status: DC | PRN
  Administered 2012-07-28 (×2): 10 mg via INTRAVENOUS
  Administered 2012-07-28: 5 mg via INTRAVENOUS
  Administered 2012-07-28 (×5): 10 mg via INTRAVENOUS

## 2012-07-28 MED ORDER — BUPIVACAINE HCL (PF) 0.25 % IJ SOLN
INTRAMUSCULAR | Status: DC | PRN
  Administered 2012-07-28: 10 mL

## 2012-07-28 MED ORDER — LANOLIN HYDROUS EX OINT: 1.0000 "application " | TOPICAL_OINTMENT | CUTANEOUS | Status: DC | PRN

## 2012-07-28 MED ORDER — DEXTROSE 5 % IV SOLN
3.0000 g | INTRAVENOUS | Status: AC
  Administered 2012-07-28: 2 g via INTRAVENOUS
  Filled 2012-07-28: qty 3000

## 2012-07-28 MED ORDER — LACTATED RINGERS IV SOLN
INTRAVENOUS | Status: DC
  Administered 2012-07-28 (×3): via INTRAVENOUS

## 2012-07-28 MED ORDER — SCOPOLAMINE 1 MG/3DAYS TD PT72
1.0000 | MEDICATED_PATCH | TRANSDERMAL | Status: DC
  Administered 2012-07-28: 1.5 mg via TRANSDERMAL

## 2012-07-28 MED ORDER — EPHEDRINE 5 MG/ML INJ
INTRAVENOUS | Status: AC
  Filled 2012-07-28: qty 10

## 2012-07-28 MED ORDER — FAMOTIDINE IN NACL 20-0.9 MG/50ML-% IV SOLN
20.0000 mg | Freq: Once | INTRAVENOUS | Status: AC
  Administered 2012-07-28: 20 mg via INTRAVENOUS
  Filled 2012-07-28: qty 50

## 2012-07-28 MED ORDER — ONDANSETRON HCL 4 MG/2ML IJ SOLN
INTRAMUSCULAR | Status: AC
  Filled 2012-07-28: qty 2

## 2012-07-28 MED ORDER — METHYLERGONOVINE MALEATE 0.2 MG/ML IJ SOLN
0.2000 mg | INTRAMUSCULAR | Status: DC | PRN
Start: 2012-07-28 — End: 2012-07-30

## 2012-07-28 MED ORDER — KETOROLAC TROMETHAMINE 60 MG/2ML IM SOLN
60.0000 mg | Freq: Once | INTRAMUSCULAR | Status: AC | PRN
  Filled 2012-07-28: qty 2

## 2012-07-28 MED ORDER — KETOROLAC TROMETHAMINE 30 MG/ML IJ SOLN: 30.0000 mg | Freq: Four times a day (QID) | INTRAMUSCULAR | Status: DC | PRN

## 2012-07-28 MED ORDER — CEFAZOLIN SODIUM-DEXTROSE 2-3 GM-% IV SOLR
INTRAVENOUS | Status: AC
  Filled 2012-07-28: qty 50

## 2012-07-28 MED ORDER — FENTANYL CITRATE 0.05 MG/ML IJ SOLN
INTRAMUSCULAR | Status: AC
  Filled 2012-07-28: qty 2

## 2012-07-28 MED ORDER — OXYCODONE-ACETAMINOPHEN 5-325 MG PO TABS: 1.0000 | ORAL_TABLET | ORAL | Status: DC | PRN

## 2012-07-28 MED ORDER — BUPIVACAINE HCL (PF) 0.25 % IJ SOLN
INTRAMUSCULAR | Status: AC
  Filled 2012-07-28: qty 30

## 2012-07-28 MED ORDER — DIBUCAINE 1 % RE OINT: 1.0000 "application " | TOPICAL_OINTMENT | RECTAL | Status: DC | PRN

## 2012-07-28 MED ORDER — CITRIC ACID-SODIUM CITRATE 334-500 MG/5ML PO SOLN
ORAL | Status: AC
  Filled 2012-07-28: qty 15

## 2012-07-28 MED ORDER — NALBUPHINE HCL 10 MG/ML IJ SOLN: 5.0000 mg | INTRAMUSCULAR | Status: DC | PRN

## 2012-07-28 MED ORDER — ZOLPIDEM TARTRATE 5 MG PO TABS: 5.0000 mg | ORAL_TABLET | Freq: Every evening | ORAL | Status: DC | PRN

## 2012-07-28 MED ORDER — PHENYLEPHRINE 40 MCG/ML (10ML) SYRINGE FOR IV PUSH (FOR BLOOD PRESSURE SUPPORT)
PREFILLED_SYRINGE | INTRAVENOUS | Status: AC
  Filled 2012-07-28: qty 5

## 2012-07-28 MED ORDER — IBUPROFEN 600 MG PO TABS
600.0000 mg | ORAL_TABLET | Freq: Four times a day (QID) | ORAL | Status: DC
  Administered 2012-07-29 – 2012-07-30 (×6): 600 mg via ORAL
  Filled 2012-07-28 (×6): qty 1

## 2012-07-28 MED ORDER — LACTATED RINGERS IV SOLN
INTRAVENOUS | Status: DC
  Administered 2012-07-29 (×2): via INTRAVENOUS

## 2012-07-28 MED ORDER — WITCH HAZEL-GLYCERIN EX PADS: 1.0000 "application " | MEDICATED_PAD | CUTANEOUS | Status: DC | PRN

## 2012-07-28 MED ORDER — ONDANSETRON HCL 4 MG/2ML IJ SOLN
INTRAMUSCULAR | Status: DC | PRN
  Administered 2012-07-28 (×2): 4 mg via INTRAVENOUS

## 2012-07-28 MED ORDER — SCOPOLAMINE 1 MG/3DAYS TD PT72
MEDICATED_PATCH | TRANSDERMAL | Status: AC
  Filled 2012-07-28: qty 1

## 2012-07-28 MED ORDER — SCOPOLAMINE 1 MG/3DAYS TD PT72
MEDICATED_PATCH | TRANSDERMAL | Status: AC
  Administered 2012-07-28: 1.5 mg via TRANSDERMAL
  Filled 2012-07-28: qty 1

## 2012-07-28 MED ORDER — NALOXONE HCL 0.4 MG/ML IJ SOLN: 0.4000 mg | INTRAMUSCULAR | Status: DC | PRN

## 2012-07-28 MED ORDER — MEPERIDINE HCL 25 MG/ML IJ SOLN: 6.2500 mg | INTRAMUSCULAR | Status: DC | PRN

## 2012-07-28 MED ORDER — PHENYLEPHRINE HCL 10 MG/ML IJ SOLN
INTRAMUSCULAR | Status: DC | PRN
  Administered 2012-07-28 (×3): 80 ug via INTRAVENOUS
  Administered 2012-07-28 (×4): 40 ug via INTRAVENOUS

## 2012-07-28 MED ORDER — FENTANYL CITRATE 0.05 MG/ML IJ SOLN
INTRAMUSCULAR | Status: DC | PRN
  Administered 2012-07-28: 15 ug via INTRATHECAL

## 2012-07-28 MED ORDER — DIPHENHYDRAMINE HCL 50 MG/ML IJ SOLN: 25.0000 mg | INTRAMUSCULAR | Status: DC | PRN

## 2012-07-28 MED ORDER — METHYLERGONOVINE MALEATE 0.2 MG PO TABS: 0.2000 mg | ORAL_TABLET | ORAL | Status: DC | PRN

## 2012-07-28 MED ORDER — OXYTOCIN 10 UNIT/ML IJ SOLN
INTRAMUSCULAR | Status: AC
  Filled 2012-07-28: qty 4

## 2012-07-28 MED ORDER — SENNOSIDES-DOCUSATE SODIUM 8.6-50 MG PO TABS
2.0000 | ORAL_TABLET | Freq: Every day | ORAL | Status: DC
  Administered 2012-07-29: 2 via ORAL

## 2012-07-28 MED ORDER — MORPHINE SULFATE 0.5 MG/ML IJ SOLN
INTRAMUSCULAR | Status: AC
  Filled 2012-07-28: qty 10

## 2012-07-28 MED ORDER — METOCLOPRAMIDE HCL 5 MG/ML IJ SOLN
10.0000 mg | Freq: Three times a day (TID) | INTRAMUSCULAR | Status: DC | PRN
  Administered 2012-07-28: 10 mg via INTRAVENOUS
  Filled 2012-07-28: qty 2

## 2012-07-28 MED ORDER — KETOROLAC TROMETHAMINE 30 MG/ML IJ SOLN
INTRAMUSCULAR | Status: AC
  Administered 2012-07-28: 30 mg
  Filled 2012-07-28: qty 1

## 2012-07-28 MED ORDER — DIPHENHYDRAMINE HCL 25 MG PO CAPS: 25.0000 mg | ORAL_CAPSULE | Freq: Four times a day (QID) | ORAL | Status: DC | PRN

## 2012-07-28 MED ORDER — SCOPOLAMINE 1 MG/3DAYS TD PT72: 1.0000 | MEDICATED_PATCH | Freq: Once | TRANSDERMAL | Status: DC

## 2012-07-28 MED ORDER — IBUPROFEN 600 MG PO TABS: 600.0000 mg | ORAL_TABLET | Freq: Four times a day (QID) | ORAL | Status: DC | PRN

## 2012-07-28 MED ORDER — FENTANYL CITRATE 0.05 MG/ML IJ SOLN: 25.0000 ug | INTRAMUSCULAR | Status: DC | PRN

## 2012-07-28 MED ORDER — CITRIC ACID-SODIUM CITRATE 334-500 MG/5ML PO SOLN: 30.0000 mL | Freq: Once | ORAL | Status: DC

## 2012-07-28 MED ORDER — SODIUM CHLORIDE 0.9 % IJ SOLN: 3.0000 mL | INTRAMUSCULAR | Status: DC | PRN

## 2012-07-28 MED ORDER — TETANUS-DIPHTH-ACELL PERTUSSIS 5-2.5-18.5 LF-MCG/0.5 IM SUSP: 0.5000 mL | Freq: Once | INTRAMUSCULAR | Status: DC

## 2012-07-28 MED ORDER — ONDANSETRON HCL 4 MG/2ML IJ SOLN: 4.0000 mg | INTRAMUSCULAR | Status: DC | PRN

## 2012-07-28 MED ORDER — SIMETHICONE 80 MG PO CHEW
80.0000 mg | CHEWABLE_TABLET | Freq: Three times a day (TID) | ORAL | Status: DC
  Administered 2012-07-29 – 2012-07-30 (×5): 80 mg via ORAL

## 2012-07-28 MED ORDER — ONDANSETRON HCL 4 MG PO TABS: 4.0000 mg | ORAL_TABLET | ORAL | Status: DC | PRN

## 2012-07-28 MED ORDER — MORPHINE SULFATE (PF) 0.5 MG/ML IJ SOLN
INTRAMUSCULAR | Status: DC | PRN
  Administered 2012-07-28: .1 mg via INTRATHECAL

## 2012-07-28 MED ORDER — DIPHENHYDRAMINE HCL 25 MG PO CAPS
25.0000 mg | ORAL_CAPSULE | ORAL | Status: DC | PRN
  Filled 2012-07-28: qty 1

## 2012-07-28 MED ORDER — DIPHENHYDRAMINE HCL 50 MG/ML IJ SOLN: 12.5000 mg | INTRAMUSCULAR | Status: DC | PRN

## 2012-07-28 MED ORDER — BUPIVACAINE IN DEXTROSE 0.75-8.25 % IT SOLN
INTRATHECAL | Status: DC | PRN
  Administered 2012-07-28: 1.4 mL via INTRATHECAL

## 2012-07-28 MED ORDER — MENTHOL 3 MG MT LOZG: 1.0000 | LOZENGE | OROMUCOSAL | Status: DC | PRN

## 2012-07-28 MED ORDER — NALOXONE HCL 1 MG/ML IJ SOLN: 1.0000 ug/kg/h | INTRAMUSCULAR | Status: DC | PRN

## 2012-07-28 MED ORDER — OXYTOCIN 40 UNITS IN LACTATED RINGERS INFUSION - SIMPLE MED: 62.5000 mL/h | INTRAVENOUS | Status: DC

## 2012-07-28 MED ORDER — PRENATAL MULTIVITAMIN CH
1.0000 | ORAL_TABLET | Freq: Every day | ORAL | Status: DC
  Administered 2012-07-29 – 2012-07-30 (×2): 1 via ORAL
  Filled 2012-07-28 (×2): qty 1

## 2012-07-28 MED ORDER — METOCLOPRAMIDE HCL 5 MG/ML IJ SOLN
10.0000 mg | Freq: Once | INTRAMUSCULAR | Status: AC
  Administered 2012-07-28: 10 mg via INTRAVENOUS
  Filled 2012-07-28: qty 2

## 2012-07-28 SURGICAL SUPPLY — 32 items
CLOTH BEACON ORANGE TIMEOUT ST (SAFETY) ×2 IMPLANT
CONTAINER PREFILL 10% NBF 15ML (MISCELLANEOUS) IMPLANT
DRAPE LG THREE QUARTER DISP (DRAPES) ×2 IMPLANT
DRESSING TELFA 8X3 (GAUZE/BANDAGES/DRESSINGS) IMPLANT
DRSG OPSITE POSTOP 4X10 (GAUZE/BANDAGES/DRESSINGS) ×2 IMPLANT
DURAPREP 26ML APPLICATOR (WOUND CARE) ×2 IMPLANT
ELECT REM PT RETURN 9FT ADLT (ELECTROSURGICAL) ×2
ELECTRODE REM PT RTRN 9FT ADLT (ELECTROSURGICAL) ×1 IMPLANT
EXTRACTOR VACUUM M CUP 4 TUBE (SUCTIONS) IMPLANT
GAUZE SPONGE 4X4 12PLY STRL LF (GAUZE/BANDAGES/DRESSINGS) ×4 IMPLANT
GLOVE BIO SURGEON STRL SZ7.5 (GLOVE) ×4 IMPLANT
GOWN PREVENTION PLUS LG XLONG (DISPOSABLE) ×4 IMPLANT
GOWN PREVENTION PLUS XLARGE (GOWN DISPOSABLE) ×2 IMPLANT
KIT ABG SYR 3ML LUER SLIP (SYRINGE) IMPLANT
NEEDLE HYPO 25X1 1.5 SAFETY (NEEDLE) ×2 IMPLANT
NEEDLE HYPO 25X5/8 SAFETYGLIDE (NEEDLE) IMPLANT
NS IRRIG 1000ML POUR BTL (IV SOLUTION) ×2 IMPLANT
PACK C SECTION WH (CUSTOM PROCEDURE TRAY) ×2 IMPLANT
PAD ABD 7.5X8 STRL (GAUZE/BANDAGES/DRESSINGS) ×2 IMPLANT
SLEEVE SCD COMPRESS KNEE MED (MISCELLANEOUS) IMPLANT
STAPLER VISISTAT 35W (STAPLE) ×2 IMPLANT
SUT MNCRL 0 VIOLET CTX 36 (SUTURE) ×2 IMPLANT
SUT MON AB 2-0 CT1 27 (SUTURE) ×2 IMPLANT
SUT MON AB-0 CT1 36 (SUTURE) ×4 IMPLANT
SUT MONOCRYL 0 CTX 36 (SUTURE) ×2
SUT PLAIN 0 NONE (SUTURE) IMPLANT
SUT PLAIN 2 0 XLH (SUTURE) IMPLANT
SYR CONTROL 10ML LL (SYRINGE) ×2 IMPLANT
TAPE CLOTH SURG 4X10 WHT LF (GAUZE/BANDAGES/DRESSINGS) ×2 IMPLANT
TOWEL OR 17X24 6PK STRL BLUE (TOWEL DISPOSABLE) ×6 IMPLANT
TRAY FOLEY CATH 14FR (SET/KITS/TRAYS/PACK) ×2 IMPLANT
WATER STERILE IRR 1000ML POUR (IV SOLUTION) ×2 IMPLANT

## 2012-07-28 NOTE — Anesthesia Preprocedure Evaluation (Addendum)
Anesthesia Evaluation  Patient identified by MRN, date of birth, ID band Patient awake    Reviewed: Allergy & Precautions, H&P , NPO status , Patient's Chart, lab work & pertinent test results, reviewed documented beta blocker date and time   History of Anesthesia Complications Negative for: history of anesthetic complications  Airway Mallampati: II TM Distance: >3 FB Neck ROM: full    Dental  (+) Teeth Intact   Pulmonary neg pulmonary ROS,  breath sounds clear to auscultation        Cardiovascular + Valvular Problems/Murmurs (h/o subaortic stenosis, repaired at age 32, no problems since.  Echo in 11/13 with EF 60%, no LVOT gradient, normal AV) Rhythm:regular Rate:Normal + Systolic murmurs    Neuro/Psych Deaf in left ear negative psych ROS   GI/Hepatic Neg liver ROS, GERD-  Medicated,  Endo/Other  Morbid obesity  Renal/GU negative Renal ROS  negative genitourinary   Musculoskeletal   Abdominal   Peds  Hematology  (+) anemia , Anti-duffy antibodies - awaiting type and crossmatch.  Patient is high PPH.  Per Dr Billy Coast, case is urgent and unable to wait for crossmatched units to be available.   Anesthesia Other Findings Ate pancakes at 9:45 am Will give bicitra, pepcid and reglan  Reproductive/Obstetrics (+) Pregnancy (h/o c/s x1)                         Anesthesia Physical Anesthesia Plan  ASA: III and emergent  Anesthesia Plan: Spinal   Post-op Pain Management:    Induction:   Airway Management Planned:   Additional Equipment:   Intra-op Plan:   Post-operative Plan:   Informed Consent: I have reviewed the patients History and Physical, chart, labs and discussed the procedure including the risks, benefits and alternatives for the proposed anesthesia with the patient or authorized representative who has indicated his/her understanding and acceptance.     Plan Discussed with: CRNA and  Surgeon  Anesthesia Plan Comments:         Anesthesia Quick Evaluation

## 2012-07-28 NOTE — Anesthesia Postprocedure Evaluation (Signed)
  Anesthesia Post-op Note  Patient: [name]  Procedure(s) Performed: Procedure(s) with comments: CESAREAN SECTION (N/A) - Repeat C/S  EDD: 07/20/12  Patient Location: PACU  Anesthesia Type:Spinal  Level of Consciousness: awake, alert  and oriented  Airway and Oxygen Therapy: Patient Spontanous Breathing  Post-op Pain: none  Post-op Assessment: Post-op Vital signs reviewed, Patient's Cardiovascular Status Stable, Respiratory Function Stable, Patent Airway, No signs of Nausea or vomiting, Pain level controlled, No headache, No backache, No residual numbness and No residual motor weakness  Post-op Vital Signs: Reviewed and stable  Complications: No apparent anesthesia complications

## 2012-07-28 NOTE — Anesthesia Procedure Notes (Signed)
Spinal  Patient location during procedure: OR Start time: 07/28/2012 6:30 PM Staffing Performed by: anesthesiologist  Preanesthetic Checklist Completed: patient identified, site marked, surgical consent, pre-op evaluation, timeout performed, IV checked, risks and benefits discussed and monitors and equipment checked Spinal Block Patient position: sitting Prep: site prepped and draped and DuraPrep Patient monitoring: heart rate, cardiac monitor, continuous pulse ox and blood pressure Approach: midline Location: L3-4 Injection technique: single-shot Needle Needle type: Sprotte  Needle gauge: 24 G Needle length: 9 cm Assessment Sensory level: T4 Additional Notes Clear free flow CSF on first attempt.  No paresthesia.  Patient tolerated procedure well with no apparent complications. Jasmine December, MD

## 2012-07-28 NOTE — H&P (Signed)
  HPI: 41 weeks with severe oligo and decels in office. BPP reassuring. Ate at 930 (pancakes) and scheduled for csec.   O: VSS- afebrile PE dictated  CBC    Component Value Date/Time   WBC 10.5 07/28/2012 1625   RBC 3.95 07/28/2012 1625   HGB 10.6* 07/28/2012 1625   HCT 33.4* 07/28/2012 1625   PLT 276 07/28/2012 1625   MCV 84.6 07/28/2012 1625   MCH 26.8 07/28/2012 1625   MCHC 31.7 07/28/2012 1625   RDW 16.0* 07/28/2012 1625   LYMPHSABS 3.2 04/02/2012 0328   MONOABS 0.5 04/02/2012 0328   EOSABS 0.1 04/02/2012 0328   BASOSABS 0.0 04/02/2012 0328    A: 41 weeks Severe Oligo Previous Cesarean Section History of corrected subaortic stenosis  P:  Proceed with csection. Risks vs benefits discussed. Consent signed  Full history and physical dictated.

## 2012-07-28 NOTE — Op Note (Signed)
[name], [name]            ACCOUNT NO.:  [id number]  MEDICAL RECORD NO.:  [id number]  LOCATION:  [facility]                          FACILITY:  [facility]  PHYSICIAN:  Lenoard Aden, M.D.DATE OF BIRTH:  [birth year]  DATE OF PROCEDURE:  07/28/2012 DATE OF DISCHARGE:                              OPERATIVE REPORT   PREOPERATIVE DIAGNOSES: 1. A 41 week intrauterine pregnancy. 2. Severe oligohydramnios. 3. History of fetal heart rate deceleration. 4. Probable large for gestational age. 5. Previous C-section.  POSTOPERATIVE DIAGNOSES: 1. A 41 week intrauterine pregnancy. 2. Severe oligohydramnios. 3. History of fetal heart rate deceleration. 4. Probable large for gestational age. 5. Previous C-section. 6. Thick meconium-stained amniotic fluid  PROCEDURE:  Repeat low segment transverse cesarean section.  SURGEON:  Lenoard Aden, M.D.  ASSISTANT:  Marlinda Mike, C.N.M.  ANESTHESIA:  Spinal and local.  Spinal given by Dana Allan, MD.  ESTIMATED BLOOD LOSS:  300 mL.  COMPLICATIONS:  None.  DRAINS:  Foley.  COUNTS:  Correct.  DISPOSITION:  The patient to recovery in good condition.  Placenta sent to pathology.  BRIEF OPERATIVE NOTE:  The patient was seen in the office today, diagnosed with severe oligohydramnios and AFI of 2.  During ultrasound, decelerations in the fetal heart rate were seen ranging in the 180-150 beat per minute range.  NST was preformed, which had occasional intermittent variable decelerations, but was otherwise reactive, and biophysical profile was 6/10.  The patient was instructed to come to the hospital for continuous monitoring prior to her C-section, which was scheduled for 8 hours subsequent to her last meal(pending Fetal heart rate tracing), which was at 09:30 this morning.  The patient, however, did not come to the hospital at this point(unknown reason), and did not appear for her preop visit and was not monitored due to this confusion prior  to her surgery.   At this time(upon presentation to Same day surgery), decision was made then to proceed with surgery in spite of the fact that she had a positive antibody titer, it was felt that the possible 3-6 hour delay that may be needed to get blood was not a wise decision in lieu of possible nonreassuring fetal heart rate status.  Please note that the fetus was not continuously monitored prior to the C-section due to aforementioned reasons.  At this point, per primary surgeon (Dr. Ulice Bold was made to proceed without awaiting the proper type and crossmatch due to concerns regarding fetal status. Situation discussed with anesthesia. Safety portal issues noted.  The patient's consent was signed.  DESCRIPTION OF PROCEDURE:  The patient was brought to the operating room, where she was administered a spinal anesthetic without complications.  Prepped and draped in the usual sterile fashion.  Foley catheter was placed.  After achieving adequate anesthesia, dilute Marcaine solution was placed.  Pfannenstiel skin incision was made with a scalpel, carried down the fascia, which was nicked in the midline and opened transversely using Mayo scissors.  Rectus muscles were dissected off sharply in the midline.  Peritoneum entered sharply.  The adherence of the bladder flap to the mid lower uterine segment, which were dissected sharply off the lower uterine segment exposing a thinned lower  uterine segment.  At this time, a Sharl Ma hysterotomy incision was made, extended in a cephalad caudad blunt dissection method.  Amniotomy for thick particulate meconium was noted and delivery atraumatically of a full-term living female, with pediatricians in attendance.  Apgars pending, but the baby was vigorous at the time of delivery, and baby was to be going to the newborn nursery based on status at that time.  At this time, the uterus was curetted using a dry lap pack, closed in 2 running imbricating  layers of 0 Monocryl suture.  The tubes and ovaries appeared normal.  Bladder flap was inspected and found to be hemostatic. Urine was clear.  At this time, fascia was then closed using 0 Monocryl continuous running fashion.  Subcutaneous tissue was reapproximated with a 2-0 plain in a continuous running fashion.  Skin was closed using staples.  The patient tolerated the procedure well and was transferred to the recovery room in good condition.        Lenoard Aden, M.D.     RJT/MEDQ  D:  07/28/2012  T:  07/28/2012  Job:  [id number]

## 2012-07-28 NOTE — OR Nursing (Signed)
[id number] mobile

## 2012-07-28 NOTE — Progress Notes (Signed)
Patient ID: [name], female   DOB: 03-18-1981, 31 y.o.   MRN: [id number] Patient seen and examined. Consent witnessed and signed. No changes noted. Update completed.

## 2012-07-28 NOTE — Transfer of Care (Signed)
Immediate Anesthesia Transfer of Care Note  Patient: [name]  Procedure(s) Performed: Procedure(s) with comments: CESAREAN SECTION (N/A) - Repeat C/S  EDD: 07/20/12  Patient Location: PACU  Anesthesia Type:Spinal  Level of Consciousness: awake, alert  and oriented  Airway & Oxygen Therapy: Patient Spontanous Breathing  Post-op Assessment: Report given to PACU RN and Post -op Vital signs reviewed and stable  Post vital signs: Reviewed and stable  Complications: No apparent anesthesia complications

## 2012-07-28 NOTE — Op Note (Signed)
Cesarean section OP note dictated. #[id number]

## 2012-07-29 ENCOUNTER — Encounter (HOSPITAL_COMMUNITY): Payer: Self-pay | Admitting: Obstetrics and Gynecology

## 2012-07-29 LAB — CBC
HCT: 25.6 % — ABNORMAL LOW (ref 36.0–46.0)
Hemoglobin: 8.2 g/dL — ABNORMAL LOW (ref 12.0–15.0)
MCH: 27.3 pg (ref 26.0–34.0)
RBC: 3 MIL/uL — ABNORMAL LOW (ref 3.87–5.11)

## 2012-07-29 MED ORDER — DOCUSATE SODIUM 100 MG PO CAPS
100.0000 mg | ORAL_CAPSULE | Freq: Every day | ORAL | Status: DC
  Administered 2012-07-30: 100 mg via ORAL
  Filled 2012-07-29: qty 1

## 2012-07-29 MED ORDER — LACTATED RINGERS IV BOLUS (SEPSIS): 500.0000 mL | Freq: Once | INTRAVENOUS | Status: DC

## 2012-07-29 MED ORDER — INFLUENZA VIRUS VACC SPLIT PF IM SUSP
0.5000 mL | INTRAMUSCULAR | Status: AC
  Administered 2012-07-30: 0.5 mL via INTRAMUSCULAR
  Filled 2012-07-29 (×2): qty 0.5

## 2012-07-29 MED ORDER — POLYSACCHARIDE IRON COMPLEX 150 MG PO CAPS
150.0000 mg | ORAL_CAPSULE | Freq: Every day | ORAL | Status: DC
  Administered 2012-07-30: 150 mg via ORAL
  Filled 2012-07-29 (×2): qty 1

## 2012-07-29 NOTE — Anesthesia Postprocedure Evaluation (Signed)
  Anesthesia Post-op Note  Patient: [name]  Procedure(s) Performed: Procedure(s) with comments: CESAREAN SECTION (N/A) - Repeat C/S  EDD: 07/20/12  Patient Location: Mother/Baby  Anesthesia Type:Spinal  Level of Consciousness: awake, alert , oriented and patient cooperative  Airway and Oxygen Therapy: Patient Spontanous Breathing  Post-op Pain: mild  Post-op Assessment: Patient's Cardiovascular Status Stable, Respiratory Function Stable, Patent Airway, No signs of Nausea or vomiting, Adequate PO intake and Pain level controlled  Post-op Vital Signs: Reviewed and stable  Complications: No apparent anesthesia complications

## 2012-07-29 NOTE — Progress Notes (Signed)
POSTOPERATIVE DAY # 1 S/P cesarean section     (FYI... DEAF in LEFT ear - approach to talk to patient on RIGHT)   S:         Reports feeling well - just tired from BF most of the night             Tolerating po intake / no nausea this am / no vomiting sine last night/ no flatus / no  BM             Bleeding is light             Pain controlled with long-acting narcotic             Up ad lib / ambulatory/ voiding QS  Newborn breast feeding  / Circumcision planned  O:  VS: BP 92/42  Pulse 72  Temp(Src) 98.5 F (36.9 C) (Oral)  Resp 20  Ht 5\' 3"  (1.6 m)  Wt 122.925 kg (271 lb)  BMI 48.02 kg/m2  SpO2 96%  LMP 10/14/2011   LABS:  Recent Labs  07/28/12 1625 07/29/12 0500  WBC 10.5 10.7*  HGB 10.6* 8.2*  PLT 276 202                           I&O: + 2731             Physical Exam:             Alert and Oriented X3  Lungs: Clear and unlabored  Heart: regular rate and rhythm / no mumurs  Abdomen: soft, non-tender, non-distended, active BS             Fundus: firm, non-tender, Ueven             Dressing pressure dressing intact without drainage           Perineum: no edema  Lochia: light  Extremities: trace edema, no calf pain or tenderness, SCD in place  A:        POD # 1 S/P cesarean section            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

## 2012-07-29 NOTE — H&P (Signed)
[name], [name]            ACCOUNT NO.:  [id number]  MEDICAL RECORD NO.:  [id number]  LOCATION:                                 FACILITY:  PHYSICIAN:  Lenoard Aden, M.D.DATE OF BIRTH:  1981-04-13  DATE OF ADMISSION: DATE OF DISCHARGE:                             HISTORY & PHYSICAL   CHIEF COMPLAINT:  Severe oligohydramnios and variable decelerations for repeat C-section.  HISTORY:  She is a 32 year old white female, G2, P1, at [redacted] weeks gestation with an AFI of 2, and severe oligohydramnios, who presents for repeat C-section today.  MEDICATIONS:  Include Zofran, prenatal vitamins, iron supplements, and Pepcid.  SOCIAL HISTORY:  She is a nonsmoker, nondrinker.  Denies domestic or physical violence.  FAMILY HISTORY:  She has a family history of breast cancer and varicosities.  PAST SURGICAL HISTORY:  Previous C-section x1 in 2011.  Previous spontaneous abortion.  Remarkable for C-section and correction of subaortic stenosis in 1996.  PRENATAL COURSE:  Complicated by appearance of abnormal antibody titers in the form of anti-Duffy antibodies, which has remained at a stable low undetectable (noncritical) titer throughout the course of her pregnancy upon serial repeat consultation done with maternal fetal medicine, with normal ultrasound noted.  PAST MEDICAL HISTORY:  As noted.  Remarkable for correction of subaortic stenosis.  Cardiology consultation reveals no evidence of defect, normal maternal echo in addition to a normal fetal echo have been performed. Normal Cardiology consult.  No recommendation for prophylactic antibiotics or limitations regarding VBAC or surgical issues.  Her prenatal course otherwise uncomplicated.  PHYSICAL EXAMINATION:  GENERAL:  She is an obese white female, in no acute distress. HEENT:  Normal. NECK:  Supple.  Full range of motion. LUNGS:  Clear. HEART:  Regular rhythm. ABDOMEN:  Soft, gravid, nontender.  Estimated fetal weight  8 pounds. Cervix is closed, 70% vertex, -2. EXTREMITIES:  There are no cords. NEUROLOGIC:  Nonfocal. SKIN:  Intact.  IMPRESSION: 1. A 41-week intrauterine pregnancy. 2. Previous cesarean section. 3. Severe oligohydramnios. 4. Previous history of single layer uterine closure. 5. History of anti-Duffy antibodies with stable antibody titer. 6. Body mass index greater than 30.  PLAN:  To proceed with repeat low-segment transverse cesarean section. Risks of anesthesia, infection, bleeding, injury to abdominal organs, and need for repair was discussed.  Delayed versus immediate complications to include bowel and bladder injury noted.  The patient acknowledges and we will proceed.     Lenoard Aden, M.D.     RJT/MEDQ  D:  07/28/2012  T:  07/28/2012  Job:  [id number]

## 2012-07-29 NOTE — Progress Notes (Signed)
Pt had been having BP's in the low 100's-high90's/mid 50's since I received her from PACU. HR in the mid 70's, asymptomatic and stating that she felt fine. The NT went in around 0415 to get her orthostatic VS and try to get her up. Her BP's were lower than before. Low 90's/high 30's. The patient stated she felt dizzy when she tried to walk and was put back in bed. I went into the room and the pt again was asymptomatic and stated that she felt ok. She mentioned that she went through a similar situation with her previous Caesarian Section. Output has been decreased but still with in normal range. MD was notified and new orders written.

## 2012-07-30 MED ORDER — IBUPROFEN 600 MG PO TABS: 600.0000 mg | ORAL_TABLET | Freq: Four times a day (QID) | ORAL | Status: DC | PRN

## 2012-07-30 MED ORDER — POLYSACCHARIDE IRON COMPLEX 150 MG PO CAPS: 150.0000 mg | ORAL_CAPSULE | Freq: Two times a day (BID) | ORAL | Status: DC

## 2012-07-30 MED ORDER — HYDROCODONE-ACETAMINOPHEN 7.5-500 MG PO TABS: 1.0000 | ORAL_TABLET | Freq: Four times a day (QID) | ORAL | Status: DC | PRN

## 2012-07-30 MED ORDER — DSS 100 MG PO CAPS: 100.0000 mg | ORAL_CAPSULE | Freq: Two times a day (BID) | ORAL | Status: DC

## 2012-07-30 NOTE — Progress Notes (Signed)
Patient ID: [name], female   DOB: 03/21/1981, 32 y.o.   MRN: [id number] POD # 2  Subjective: Pt reports feeling well STRONGLY desires early d/c home today Pain controlled with ibuprofen and percocet Tolerating po/Voiding without problems/ No n/v/Flatus pos Activity: out of bed and ambulate Bleeding is light Newborn info:  Information for the patient's newborn:  Ollie, Delano [[id number]]  female Feeding: breast   Objective: VS: Blood pressure 116/78, pulse 64, temperature 97.4 F (36.3 C), temperature source Oral, resp. rate 18, height 5\' 3" , weight 271 lb.   LABS:  Recent Labs  07/28/12 1625 07/29/12 0500  WBC 10.5 10.7*  HGB 10.6* 8.2*  HCT 33.4* 25.6*  PLT 276 202     Physical Exam:  General: alert, cooperative and no distress CV: Regular rate and rhythm Resp: clear Abdomen: soft, nontender, normal bowel sounds; mod dependent edema Incision: covered with occlusive, pressure dressing, along with tegaderm and honey comb dressing Uterine Fundus: firm, below umbilicus, nontender Lochia: minimal Ext: edema +3 pedal and pretib and Homans sign is negative, no sign of DVT    A/P: POD # 2/ G3P2012/ S/P C/Section d/t elective repeat Medical hx significant for subaortic stenosis and + Duffy Antibody Chronic Anemia, super imposed with ABL Anemia; CBC in the office on 08/02/12 Wound recheck 08/02/12 Doing well and stable for early discharge home RX's: Ibuprofen 600mg  po Q 6 hrs prn pain #30 Refill x 1 Percocet 5/325 1 - 2 tabs po every 6 hrs prn pain  #30 No refill Niferex 150mg  po QD/BID #30/#60 Refill x 1 Colace 100mg  po BID prn #60    Signed: Demetrius Revel, MSN, [facility] 07/30/2012, 10:50 AM

## 2012-07-30 NOTE — Discharge Summary (Signed)
Obstetric Discharge Summary Reason for Admission: G2 P1 0 0 1  @ 41 wks for repeat C/S.  Severe Oligohydramnios, AFI 2  Pregnancy complicated by + Duffy antibody  Med hx Subaortic Stenosis repaired with heart surgery  Chronic Anemia  Hearing loss in Left ear  Prenatal Procedures: NST and ultrasound Intrapartum Procedures: cesarean: low cervical, transverse Postpartum Procedures: flu vaccine Complications-Operative and Postpartum: none Hemoglobin  Date Value Range Status  07/29/2012 8.2* 12.0 - 15.0 g/dL Final     DELTA CHECK NOTED     REPEATED TO VERIFY     HCT  Date Value Range Status  07/29/2012 25.6* 36.0 - 46.0 % Final    Physical Exam:  General: alert, cooperative and no distress Lochia: appropriate Uterine Fundus: firm Incision: Covered with occlusive pressure dressing and tegaderm/honeycomb dressing DVT Evaluation: Negative Homan's sign.  Discharge Diagnoses: S/P Repeat C/S for repeat and severe oligohydramnios  Pos Duffy Antibody  ABL Anemia, superimposed with Chronic Anemia  Hx repaired subaortic stenosis   Discharge Information: Date: 07/30/2012 Activity: pelvic rest Diet: routine Medications: PNV, Ibuprofen, Colace, Iron and Vicodin Condition: stable Instructions: refer to practice specific booklet Discharge to: home Follow-up Information   Follow up with Marlinda Mike, CNM In 6 weeks. (Wound care and repeat labs @ 115, 08/02/12)    Contact information:   [address] [place] [address] [phone]       Newborn Data: Live born female on 07/28/12 Birth Weight: 9 lb 1.3 oz (4120 g) APGAR: 8, 9  Home with mother.  Kimmie Doren K 07/30/2012, 11:19 AM

## 2012-08-01 LAB — TYPE AND SCREEN
Antibody Screen: POSITIVE
DAT, IgG: NEGATIVE
Donor AG Type: NEGATIVE
PT AG Type: NEGATIVE
Unit division: 0

## 2012-08-03 ENCOUNTER — Ambulatory Visit (HOSPITAL_COMMUNITY)
Admit: 2012-08-03 | Discharge: 2012-08-03 | Disposition: A | Discharge status: Home / Self Care | Payer: 59 | Attending: Obstetrics and Gynecology | Admitting: Obstetrics and Gynecology

## 2012-08-03 NOTE — Lactation Note (Addendum)
Adult Lactation Consultation Outpatient Visit Note  Patient Name: [name] Date of Birth: 1980/11/16 Gestational Age at Delivery: [redacted]w[redacted]d Type of Delivery:   Breastfeeding History: Frequency of Breastfeeding: Mom reports that [name] feeds constantly during the late afternoon and evening. Nursed at midnight and then 5 am and has not nursed since.  [name] is usually sleepy during the mornings.  Length of Feeding: 30-45 minutes Voids:8  Stools: 4  Supplementing / Method: Pumping:  Type of Pump: Manual   Frequency:  Volume:    Comments: Mom is pumping about once per day when she gets very full to soften breast before nursing. Reports that sometimes she has trouble getting milk out with hand pump so she is doing hand expression.Plans to get DEBP.     Consultation Evaluation:  Initial Feeding Assessment: Pre-feed Weight: 8- 15.0  4054g with hip harness Post-feed Weight: 9- 0.9  4108g  Amount Transferred: 54 cc's Comments: Assisted mom with latch to right breast. Mom reports that she has more trouble getting [name] latched to this breast and that nipple is pretty sore. Raw area noted on upper tip of nipple. Mom reports pain with initial latch but eases off after a few minutes. Reports that this feels like a deeper latch than she has been getting at home. Reviewed wide open mouth and having baby deep onto breast with lips flanged. [name] nursed for 20 minutes with lots of swallows and mom reports that breast feels softer after nursing.  Additional Feeding Assessment: Pre-feed Weight: 9- 0.9  4108g  Post-feed Weight: 9- 2.4  4152 Amount Transferred: 44 cc's Comments: [name] latched well to left breast. Mom reports no pain with latch. Lots of swallows noted. Nursed well for 15 minutes and then sleepy and nonnutritive sucking noted. [name] very relaxed and sleepy after nursing. Mom's nipple round after nursing. Comfort gels given with instructions for pain relief.   Total  Breast milk Transferred this Visit: 98 cc's Total Supplement Given: 0  Additional Interventions: Encouraged mom to wake [name] during the day every 2-3 hours for feedings so he won't be so hungry in the late afternoon. If he goes a longer period between feedings during the night- get some sleep!! Make sure he is staying awake and doing good deep sucks while he is nursing. Try to nurse on both breasts at each feeding. No further questions at present.  Follow-Up To call prn or if needs to make another OP appointment     [name] 08/03/2012, 10:12 AM

## 2012-11-16 ENCOUNTER — Other Ambulatory Visit (HOSPITAL_COMMUNITY): Payer: Self-pay | Admitting: Internal Medicine

## 2012-11-16 DIAGNOSIS — R112 Nausea with vomiting, unspecified: Secondary | ICD-10-CM

## 2012-11-17 ENCOUNTER — Ambulatory Visit (HOSPITAL_COMMUNITY)
Admission: RE | Admit: 2012-11-17 | Discharge: 2012-11-17 | Disposition: A | Discharge status: Home / Self Care | Payer: 59 | Source: Ambulatory Visit | Attending: Internal Medicine | Admitting: Internal Medicine

## 2012-11-17 DIAGNOSIS — R112 Nausea with vomiting, unspecified: Secondary | ICD-10-CM | POA: Insufficient documentation

## 2012-11-17 DIAGNOSIS — K8020 Calculus of gallbladder without cholecystitis without obstruction: Secondary | ICD-10-CM | POA: Insufficient documentation

## 2013-04-14 ENCOUNTER — Other Ambulatory Visit: Payer: Self-pay

## 2014-04-10 ENCOUNTER — Encounter (HOSPITAL_COMMUNITY): Payer: Self-pay | Admitting: Obstetrics and Gynecology

## 2015-06-18 ENCOUNTER — Telehealth: Payer: 59 | Admitting: Family

## 2015-06-18 DIAGNOSIS — J111 Influenza due to unidentified influenza virus with other respiratory manifestations: Secondary | ICD-10-CM | POA: Diagnosis not present

## 2015-06-18 MED ORDER — OSELTAMIVIR PHOSPHATE 75 MG PO CAPS: 75.0000 mg | ORAL_CAPSULE | Freq: Two times a day (BID) | ORAL | Status: DC

## 2015-06-18 MED FILL — TAMIFLU 75 MG GELCAP: 75 | 5 days supply | Qty: 10 | Fill #0

## 2015-06-18 NOTE — Progress Notes (Signed)

## 2015-06-22 DIAGNOSIS — J069 Acute upper respiratory infection, unspecified: Secondary | ICD-10-CM | POA: Diagnosis not present

## 2015-06-22 MED FILL — BENZONATATE 200 MG CAPSULE: 200 | 10 days supply | Qty: 30 | Fill #0

## 2015-06-22 MED FILL — AZITHROMYCIN 250 MG TABLET: 250 | 5 days supply | Qty: 6 | Fill #0

## 2015-11-12 DIAGNOSIS — F419 Anxiety disorder, unspecified: Secondary | ICD-10-CM | POA: Diagnosis not present

## 2015-11-12 DIAGNOSIS — F331 Major depressive disorder, recurrent, moderate: Secondary | ICD-10-CM | POA: Diagnosis not present

## 2015-11-12 MED FILL — VENLAFAXINE HCL ER 37.5 MG: 37.5 | 14 days supply | Qty: 14 | Fill #0

## 2015-11-22 MED FILL — ESCITALOPRAM 10 MG TABLET: 10 | 30 days supply | Qty: 30 | Fill #0

## 2015-12-24 MED FILL — ESCITALOPRAM 10 MG TABLET: 10 | 30 days supply | Qty: 30 | Fill #1

## 2016-01-24 MED FILL — ESCITALOPRAM 10 MG TABLET: 10 | 30 days supply | Qty: 30 | Fill #2

## 2016-03-27 DIAGNOSIS — R635 Abnormal weight gain: Secondary | ICD-10-CM | POA: Diagnosis not present

## 2016-03-27 DIAGNOSIS — F331 Major depressive disorder, recurrent, moderate: Secondary | ICD-10-CM | POA: Diagnosis not present

## 2016-03-27 DIAGNOSIS — Z0000 Encounter for general adult medical examination without abnormal findings: Secondary | ICD-10-CM | POA: Diagnosis not present

## 2016-03-27 DIAGNOSIS — F419 Anxiety disorder, unspecified: Secondary | ICD-10-CM | POA: Diagnosis not present

## 2016-03-27 LAB — HEPATIC FUNCTION PANEL
ALT: 16 (ref 7–35)
AST: 17 (ref 13–35)

## 2016-03-27 LAB — BASIC METABOLIC PANEL
BUN: 14 (ref 4–21)
CREATININE: 0.6 (ref <=1.1)
GLUCOSE: 89
Potassium: 4.4 (ref 3.4–5.3)
SODIUM: 139 (ref 137–147)

## 2016-03-27 LAB — LIPID PANEL
Cholesterol: 155 (ref 0–200)
HDL: 38 (ref 35–70)
LDL CALC: 117
Triglycerides: 134 (ref 40–160)

## 2016-03-27 LAB — CBC AND DIFFERENTIAL
HEMATOCRIT: 34 — AB (ref 36–46)
Hemoglobin: 11.5 — AB (ref 12.0–16.0)
Platelets: 263 (ref 150–399)
WBC: 8.4

## 2016-03-27 LAB — TSH: TSH: 5.14 (ref <=5.90)

## 2016-04-21 MED FILL — ESCITALOPRAM 10 MG TABLET: 10 | 30 days supply | Qty: 30 | Fill #0

## 2016-05-29 MED FILL — ESCITALOPRAM 10 MG TABLET: 10 | 30 days supply | Qty: 30 | Fill #1

## 2016-07-02 MED FILL — ESCITALOPRAM 10 MG TABLET: 10 | 30 days supply | Qty: 30 | Fill #2

## 2016-07-29 DIAGNOSIS — O09291 Supervision of pregnancy with other poor reproductive or obstetric history, first trimester: Secondary | ICD-10-CM | POA: Diagnosis not present

## 2016-07-29 DIAGNOSIS — Z3A01 Less than 8 weeks gestation of pregnancy: Secondary | ICD-10-CM | POA: Diagnosis not present

## 2016-07-29 DIAGNOSIS — Z3201 Encounter for pregnancy test, result positive: Secondary | ICD-10-CM | POA: Diagnosis not present

## 2016-07-30 MED FILL — PROGESTERONE 200 MG CAPSULE: 200 | 30 days supply | Qty: 30 | Fill #0

## 2016-07-31 DIAGNOSIS — O09291 Supervision of pregnancy with other poor reproductive or obstetric history, first trimester: Secondary | ICD-10-CM | POA: Diagnosis not present

## 2016-07-31 DIAGNOSIS — Z3A01 Less than 8 weeks gestation of pregnancy: Secondary | ICD-10-CM | POA: Diagnosis not present

## 2016-08-12 DIAGNOSIS — Z3201 Encounter for pregnancy test, result positive: Secondary | ICD-10-CM | POA: Diagnosis not present

## 2016-08-14 DIAGNOSIS — Z3481 Encounter for supervision of other normal pregnancy, first trimester: Secondary | ICD-10-CM | POA: Diagnosis not present

## 2016-08-14 DIAGNOSIS — Z3689 Encounter for other specified antenatal screening: Secondary | ICD-10-CM | POA: Diagnosis not present

## 2016-08-14 LAB — OB RESULTS CONSOLE RPR: RPR: NONREACTIVE

## 2016-08-14 LAB — OB RESULTS CONSOLE RUBELLA ANTIBODY, IGM: Rubella: IMMUNE

## 2016-08-14 LAB — OB RESULTS CONSOLE HEPATITIS B SURFACE ANTIGEN: HEP B S AG: NEGATIVE

## 2016-08-14 LAB — OB RESULTS CONSOLE ABO/RH: RH TYPE: POSITIVE

## 2016-08-14 LAB — OB RESULTS CONSOLE HIV ANTIBODY (ROUTINE TESTING): HIV: NONREACTIVE

## 2016-08-14 LAB — OB RESULTS CONSOLE ANTIBODY SCREEN: ANTIBODY SCREEN: POSITIVE

## 2016-09-03 MED FILL — PROGESTERONE 200 MG CAPSULE: 200 | 30 days supply | Qty: 30 | Fill #1

## 2016-09-04 DIAGNOSIS — Z3491 Encounter for supervision of normal pregnancy, unspecified, first trimester: Secondary | ICD-10-CM | POA: Diagnosis not present

## 2016-09-04 DIAGNOSIS — O09291 Supervision of pregnancy with other poor reproductive or obstetric history, first trimester: Secondary | ICD-10-CM | POA: Diagnosis not present

## 2016-09-04 DIAGNOSIS — Z3689 Encounter for other specified antenatal screening: Secondary | ICD-10-CM | POA: Diagnosis not present

## 2016-09-04 DIAGNOSIS — Z3A10 10 weeks gestation of pregnancy: Secondary | ICD-10-CM | POA: Diagnosis not present

## 2016-09-04 DIAGNOSIS — Z36 Encounter for antenatal screening for chromosomal anomalies: Secondary | ICD-10-CM | POA: Diagnosis not present

## 2016-09-04 DIAGNOSIS — Z3682 Encounter for antenatal screening for nuchal translucency: Secondary | ICD-10-CM | POA: Diagnosis not present

## 2016-09-24 DIAGNOSIS — Z3A13 13 weeks gestation of pregnancy: Secondary | ICD-10-CM | POA: Diagnosis not present

## 2016-09-24 DIAGNOSIS — Z3689 Encounter for other specified antenatal screening: Secondary | ICD-10-CM | POA: Diagnosis not present

## 2016-09-24 DIAGNOSIS — Z3682 Encounter for antenatal screening for nuchal translucency: Secondary | ICD-10-CM | POA: Diagnosis not present

## 2016-09-24 DIAGNOSIS — Z3481 Encounter for supervision of other normal pregnancy, first trimester: Secondary | ICD-10-CM | POA: Diagnosis not present

## 2016-09-24 DIAGNOSIS — O09291 Supervision of pregnancy with other poor reproductive or obstetric history, first trimester: Secondary | ICD-10-CM | POA: Diagnosis not present

## 2016-09-30 ENCOUNTER — Ambulatory Visit (HOSPITAL_COMMUNITY)
Admission: RE | Admit: 2016-09-30 | Discharge: 2016-09-30 | Disposition: A | Discharge status: Home / Self Care | Payer: 59 | Source: Ambulatory Visit | Attending: Obstetrics and Gynecology | Admitting: Obstetrics and Gynecology

## 2016-09-30 DIAGNOSIS — Z8279 Family history of other congenital malformations, deformations and chromosomal abnormalities: Secondary | ICD-10-CM | POA: Diagnosis not present

## 2016-10-03 ENCOUNTER — Encounter (HOSPITAL_COMMUNITY): Payer: Self-pay | Admitting: MS"

## 2016-10-03 DIAGNOSIS — Z8279 Family history of other congenital malformations, deformations and chromosomal abnormalities: Secondary | ICD-10-CM | POA: Insufficient documentation

## 2016-10-03 NOTE — Progress Notes (Signed)
Genetic Counseling  High-Risk Gestation Note  Appointment Date:  10/01/2016 Referred By: Brien Few, MD Date of Birth:  09-05-1980   Pregnancy History: [id number] Estimated Date of Delivery: 03/27/17 Estimated Gestational Age: 28w5dAttending: PBenjaman Lobe MD   I met with Mrs. Kristen Piquefor genetic counseling because of a personal history of Klippel-Feil syndrome. The patient has been seen for genetic counseling previously, prior to the diagnosis of Klippel-Feil. See previous genetic counseling notes for previous discussion.   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 Obey's report is most suggestive of nonsyndromic, but no formal medical genetics evaluation has been performed to date  Sporadic, Autosomal dominant and autosomal recessive inheritance described  Recurrence risk for current pregnancy could range from low (in case of autosomal recessive or sporadic cause) to 50% (in the case of autosomal dominant cause)  Discussed options of screening / testing  Medical genetics evaluation - patient may be interested in the future but not during pregnancy  Clinical molecular testing available for certain genes associated with Klippel-Feil: testing is best performed in the context of medical genetics evaluation  Detailed ultrasound in pregnancy  Fetal echocardiogram   Cardiology follow-up for patient given her personal history   Patient reported first trimester screening resulted increased risk for Down syndrome and subsequently NIPS (Panorama) [facility]  Documentation of this was not available at time of today's visit  Patient stated not interested in amniocentesis in pregnancy at this time  Discussed general population carrier screening options - declined  CF  SMA  Hemoglobinopathies  We began by  reviewing the family history in detail. Mrs. MRaischwas previously seen for genetic counseling given her medical history which includes left ear congenital deafness, left eye problems, congenital hip dislocation, subaortic stenosis status post surgical repair at age [age]years old. She reported that she was recently diagnosed with Klippel-Feil syndrome. She presented to her chiropractor with dizziness in late 2016 to early 2017, and x ray exam subsequently visualized C2 to C3 and C4-C6 fusion in the cervical spine, consistent with Klippel-Feil syndrome. Family history is also remarkable for update for the couple's younger son, who is currently 490years old, born with congenital hip dysplasia and is followed by a pediatric orthopedist. Mrs. MKilnerreported that her mother also had congenital hip dysplasia.   Klippel-Feil syndrome occurs in approximately 1 in 40,000 births and is characterized by the abnormal fusion of two or more bones in the neck. Individuals with Klippel-Feil syndrome may have a short, webbed neck, decreased range of motion in the head and neck area, and/or a low hairline at the back of the head; while this is reported as the classic triad of Klippel-Feil, approximately 50% or less of patients have all three of these features.  Additionally, other features can be observed, including: facial asymmetry, hearing loss, cleft palate, a Sprengel anomaly (upward displacement of the scapula), fusion in the thoracic spine causing scoliosis and/or kyphosis, and heart disease. We discussed that Klippel-Feil syndrome varies in severity, with some individuals having many features and others having relatively few.   Klippel-Feil syndrome has been observed with sporadic, autosomal dominant, and autosomal recessive inheritance.  It can be syndromic or nonsyndromic. We focused our discussion on nonsyndromic causes, given that Mrs. Roskelley's reported history is not necessarily suggestive of a particular  underlying syndrome at this time. She was  counseled that Klippel-Feil syndrome is most often a dominant condition, caused by a single gene alteration that can be inherited or occur de novo. We reviewed that Klippel-Feil syndrome less commonly occurs due to autosomal recessive inheritance. Klippel-Feil syndrome shows genetic heterogeneity; to date, at least 5 genes, when altered, are known to cause Klippel-Feil syndrome.    We reviewed autosomal dominant inheritance, where offspring of an affected individual have a 1 in 2 (50%) chance to inherit the condition. We also reviewed autosomal recessive inheritance, where offspring of an affected individual have a 1 in 4 (25%) chance to inherit the condition. If Mrs. DARBIE BIANCARDI has autosomal recessive Klippel-Feil syndrome, the risk of recurrence is expected to be low, given that her partner has a negative family history and no consanguinity to her. We discussed that all of her children would be expected to be carriers of the condition, but that the father of the baby would also have to be a carrier of the condition (same gene, same type) in order for the fetus to have an increased risk to inherit the condition. If however, Mrs. SUZANNA ZAHN has autosomal dominant Klippel-Feil syndrome, the risk of recurrence is expected to be 50%. She was counseled that if she has autosomal dominant Klippel-Feil syndrome, she likely has a de novo gene change, given that neither of her parents have apparent clinical features of Klippel-Feil syndrome.   She was counseled that prenatal genetic testing for the current pregnancy is not warranted at this time, considering that Mrs. DONEISHA IVEY has never had genetic testing and a specific gene cause for her Klippel-Feil syndrome is not known. We discussed the option of genetic testing for Mrs. Windhorst and a consultation with a medical geneticist. She is not interested in these options at this time, given that her focus  is care related specific to the current pregnancy, and she would not consider prenatal diagnosis for Klippel-Feil syndrome, even in if a causative genetic variant was identified in her. She stated that she may be interested in pursuing medical genetics evaluation with possible genetic testing in the future, and we can facilitate a referral in the future, if desired. In addition, we discussed the availability of a detailed ultrasound at 18+ weeks and fetal echocardiogram. Ultrasound may detect severe fusion and shortening of the fetal neck; however mild cases may not be detectable by ultrasound. A normal appearing fetal neck by ultrasound would be reassuring, but would not eliminate the possibility of Klippel-Feil syndrome in the fetus.   We reviewed the family history congenital hip dysplasia, or developmental dysplasia of the hip, which describes a spectrum of conditions related to the development of the hip in infants and young children. There are a wide range of contributing factors. Empiric evidence suggests that recurrence risk for developmental dysplasia of the hip is approximately 1-4% in the case of a family history. However, given the reported family history for Ms. Standish, this could be an underestimate of recurrence risk. She stated that their medical providers are aware of this history.   We also briefly reviewed the previously discussed family history for Ms. Ashland's partner, Mr. Robin Searing. See previous genetic counseling note from 01/16/12 for detailed discussion. Ms. Caywood specifically inquired about Mr. Dois Davenport ichthyosis, which is reportedly isolated to his right foot and was present from birth. No additional relatives reportedly have ichthyosis, including the couple's two sons. We reviewed that the majority of ichthyoses are due to genetic factors, but there are also acquired  forms. We briefly reviewed patterns of inheritance and discussed that the more common types of ichthyoses  follow X-linked inheritance but that autosomal recessive forms exist as well. We reviewed that in the case of an X-linked form, all of Mr. Dois Davenport sons would be expected to be unaffected, and all daughters would be obligate carriers. In the case of an autosomal recessive form, recurrence risk would likely be low, given that Ms. Lubin has no reported family history and no known consanguinity to Mr. Redmond Pulling. However, we discussed that recurrence risk estimate may change with additional information regarding Mr. Dois Davenport underlying etiology for ichthyosis.  The family histories were otherwise found to be noncontributory for birth defects, mental retardation, and known genetic conditions. Without further information regarding the provided family history, an accurate genetic risk cannot be calculated. Further genetic counseling is warranted if more information is obtained.  Mrs. Donny Mosley also reported that she had first trimester screening performed through her OB office, which increased the risk for fetal Down syndrome to 1 in 256, and subsequently, Panorama (noninvasive prenatal screening ) was drawn through her OB office. Results are currently reportedly pending of Panorama. We did not have records documenting this information at the time of today's visit. We briefly reviewed benefits and limitations of both first trimester screening and NIPS as screens for fetal aneuploidy. We discussed the sensitivity and false positive rates of NIPS. We also reviewed the benefits and limitations of detailed ultrasound as a screening tool for fetal aneuploidy. She understands that screening tests are used to modify a patient's a priori risk for aneuploidy, typically based on age.  This estimate provides a pregnancy specific risk assessment.  We also reviewed the availability of diagnostic option of amniocentesis.  We discussed the risks, limitations, and benefits of each. Ms. Albany stated that she is not  interested in amniocentesis at this time given the associated risk for complications. If additional consultation is desired regarding these screens and tests and information regarding risk for fetal aneuploidy in the pregnancy, please contact our office to schedule follow-up genetic counseling. She understands that ultrasound and screens cannot rule out all birth defects or genetic syndromes. The patient was advised of this limitation.   Mrs. Donny Mosley was provided with written information regarding cystic fibrosis (CF), spinal muscular atrophy (SMA) and hemoglobinopathies including the carrier frequency, availability of carrier screening and prenatal diagnosis if indicated.  In addition, we discussed that CF and hemoglobinopathies are routinely screened for as part of the [facility] newborn screening panel.  She declined screening for CF, SMA and hemoglobinopathies at this time.  Mrs. THATIANA RENBARGER denied exposure to environmental toxins or chemical agents. She denied the use of alcohol, tobacco or street drugs. She denied significant viral illnesses during the course of her pregnancy. Her medical and surgical histories were contributory for history of congenital heart disease, as previously mentioned. Mrs. Berhe reported that she is seen by [facility] cardiology approximately every 2 years.  She reported that she has not yet been evaluated in the current pregnancy but is likely planning to pursue this. 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 Please contact our office if MFM consultation is desired in the current pregnancy regarding the patient's medical history.   I counseled Mrs MERSADES BARBARO regarding the above risks and available options.  The approximate face-to-face time with the genetic counselor was 50 minutes.  Chipper Oman, MS  Certified [employer] 10/03/2016

## 2016-10-06 DIAGNOSIS — Z3A14 14 weeks gestation of pregnancy: Secondary | ICD-10-CM | POA: Insufficient documentation

## 2016-10-10 ENCOUNTER — Other Ambulatory Visit: Payer: Self-pay

## 2016-10-14 DIAGNOSIS — Z361 Encounter for antenatal screening for raised alphafetoprotein level: Secondary | ICD-10-CM | POA: Diagnosis not present

## 2016-10-30 DIAGNOSIS — Z3A18 18 weeks gestation of pregnancy: Secondary | ICD-10-CM | POA: Diagnosis not present

## 2016-10-30 DIAGNOSIS — O09292 Supervision of pregnancy with other poor reproductive or obstetric history, second trimester: Secondary | ICD-10-CM | POA: Diagnosis not present

## 2016-10-30 DIAGNOSIS — R768 Other specified abnormal immunological findings in serum: Secondary | ICD-10-CM | POA: Diagnosis not present

## 2016-11-11 DIAGNOSIS — Z3A20 20 weeks gestation of pregnancy: Secondary | ICD-10-CM | POA: Diagnosis not present

## 2016-11-11 DIAGNOSIS — O09292 Supervision of pregnancy with other poor reproductive or obstetric history, second trimester: Secondary | ICD-10-CM | POA: Diagnosis not present

## 2016-11-25 DIAGNOSIS — Q761 Klippel-Feil syndrome: Secondary | ICD-10-CM | POA: Insufficient documentation

## 2016-11-25 DIAGNOSIS — I351 Nonrheumatic aortic (valve) insufficiency: Secondary | ICD-10-CM | POA: Insufficient documentation

## 2016-11-25 DIAGNOSIS — Q249 Congenital malformation of heart, unspecified: Secondary | ICD-10-CM | POA: Diagnosis not present

## 2016-11-25 DIAGNOSIS — O99412 Diseases of the circulatory system complicating pregnancy, second trimester: Secondary | ICD-10-CM | POA: Diagnosis not present

## 2016-12-01 DIAGNOSIS — R768 Other specified abnormal immunological findings in serum: Secondary | ICD-10-CM | POA: Diagnosis not present

## 2016-12-27 ENCOUNTER — Encounter (HOSPITAL_COMMUNITY): Payer: Self-pay | Admitting: *Deleted

## 2016-12-27 ENCOUNTER — Inpatient Hospital Stay (HOSPITAL_COMMUNITY): Payer: 59

## 2016-12-27 ENCOUNTER — Inpatient Hospital Stay (HOSPITAL_COMMUNITY)
Admission: AD | Admit: 2016-12-27 | Discharge: 2016-12-27 | Disposition: A | Discharge status: Home / Self Care | Payer: 59 | Source: Ambulatory Visit | Attending: Obstetrics and Gynecology | Admitting: Obstetrics and Gynecology

## 2016-12-27 DIAGNOSIS — O26612 Liver and biliary tract disorders in pregnancy, second trimester: Secondary | ICD-10-CM | POA: Diagnosis not present

## 2016-12-27 DIAGNOSIS — R1011 Right upper quadrant pain: Secondary | ICD-10-CM | POA: Diagnosis not present

## 2016-12-27 DIAGNOSIS — O9989 Other specified diseases and conditions complicating pregnancy, childbirth and the puerperium: Secondary | ICD-10-CM

## 2016-12-27 DIAGNOSIS — Z3A27 27 weeks gestation of pregnancy: Secondary | ICD-10-CM | POA: Diagnosis not present

## 2016-12-27 DIAGNOSIS — K8020 Calculus of gallbladder without cholecystitis without obstruction: Secondary | ICD-10-CM | POA: Insufficient documentation

## 2016-12-27 DIAGNOSIS — O26613 Liver and biliary tract disorders in pregnancy, third trimester: Secondary | ICD-10-CM | POA: Diagnosis not present

## 2016-12-27 LAB — COMPREHENSIVE METABOLIC PANEL
ALBUMIN: 2.8 g/dL — AB (ref 3.5–5.0)
ALT: 9 U/L — ABNORMAL LOW (ref 14–54)
AST: 14 U/L — AB (ref 15–41)
Alkaline Phosphatase: 87 U/L (ref 38–126)
Anion gap: 8 (ref 5–15)
BUN: 12 mg/dL (ref 6–20)
CHLORIDE: 104 mmol/L (ref 101–111)
CO2: 22 mmol/L (ref 22–32)
Calcium: 8.3 mg/dL — ABNORMAL LOW (ref 8.9–10.3)
Creatinine, Ser: 0.54 mg/dL (ref 0.44–1.00)
GFR calc Af Amer: >60 mL/min (ref >60)
Glucose, Bld: 110 mg/dL — ABNORMAL HIGH (ref 65–99)
POTASSIUM: 3.9 mmol/L (ref 3.5–5.1)
SODIUM: 134 mmol/L — AB (ref 135–145)
Total Bilirubin: 0.5 mg/dL (ref 0.3–1.2)
Total Protein: 6.4 g/dL — ABNORMAL LOW (ref 6.5–8.1)

## 2016-12-27 LAB — PROTEIN / CREATININE RATIO, URINE
CREATININE, URINE: 248 mg/dL
Protein Creatinine Ratio: 0.1 mg/mg{Cre} (ref 0.00–0.15)
Total Protein, Urine: 26 mg/dL

## 2016-12-27 LAB — CBC
HCT: 27.5 % — ABNORMAL LOW (ref 36.0–46.0)
Hemoglobin: 8.9 g/dL — ABNORMAL LOW (ref 12.0–15.0)
MCH: 27.4 pg (ref 26.0–34.0)
MCHC: 32.4 g/dL (ref 30.0–36.0)
MCV: 84.6 fL (ref 78.0–100.0)
PLATELETS: 236 10*3/uL (ref 150–400)
RBC: 3.25 MIL/uL — ABNORMAL LOW (ref 3.87–5.11)
RDW: 15.8 % — AB (ref 11.5–15.5)
WBC: 8.3 10*3/uL (ref 4.0–10.5)

## 2016-12-27 LAB — URINALYSIS, ROUTINE W REFLEX MICROSCOPIC
BILIRUBIN URINE: NEGATIVE
Glucose, UA: NEGATIVE mg/dL
HGB URINE DIPSTICK: NEGATIVE
KETONES UR: NEGATIVE mg/dL
NITRITE: NEGATIVE
PROTEIN: 30 mg/dL — AB
Specific Gravity, Urine: 1.025 (ref 1.005–1.030)
pH: 6 (ref 5.0–8.0)

## 2016-12-27 NOTE — Discharge Instructions (Signed)

## 2016-12-27 NOTE — Progress Notes (Signed)
OK to d/c efm per Thressa ShellerHeather Hogan CNM

## 2016-12-27 NOTE — MAU Note (Addendum)
Having pain in R upper quadrant for a week and a half. Different tonight. Had a headache for about 3 days the past wk but went away. Goes from temples to base of hairline and then a line on top of head that has burning sensation. Denies LOF or vag d/c. Has had trouble staying focused and continuing conversation with spouse tonight. Feels "scatter-brained"

## 2016-12-27 NOTE — MAU Provider Note (Signed)
History     CSN: [id number]  Arrival date and time: 12/27/16 0100   First Provider Initiated Contact with Patient 12/27/16 0202      Chief Complaint  Patient presents with  . Headache  . Abdominal Pain   [name] is a 36 y.o. [phone] at [redacted]w[redacted]d who presents today with RUQ pain and headache. She has had the RUQ pain for about 1.5 weeks. The headache has been off and on, but has been present since about 2330. She went out to get a BP cuff, and states that the machine read as error. She denies contractions, VB or LOF. She reports normal fetal movement. Last ate around 2000, and it was cheeseburger and fries. Last drank water around 0000. She does not remember what she ate the day the pain started.    Headache   This is a new problem. The current episode started today (around 2330. ). The problem occurs constantly. The problem has been unchanged. The pain is located in the bilateral, frontal and occipital region. The pain does not radiate. The quality of the pain is described as aching and throbbing. The pain is at a severity of 5/10. Associated symptoms include abdominal pain and nausea. Pertinent negatives include no fever or vomiting. Nothing aggravates the symptoms. She has tried nothing for the symptoms. Her past medical history is significant for migraine headaches (about once per year. ).  Abdominal Pain  This is a new problem. Episode onset: about 1.5 weeks.  The onset quality is sudden. The problem occurs intermittently. The problem has been gradually worsening. The pain is located in the RUQ. The pain is at a severity of 6/10. The quality of the pain is aching ("feels like something stuck on the bottom of my rib"). The abdominal pain does not radiate. Associated symptoms include headaches and nausea. Pertinent negatives include no fever or vomiting. Nothing aggravates the pain. The pain is relieved by nothing. She has tried nothing for the symptoms.   Past Medical History:   Diagnosis Date  . Anti-Duffy antibodies present   . Deafness    LT ear since birth   . Deafness in left ear   . Hx of varicella   . Subaortic stenosis    repaired    Past Surgical History:  Procedure Laterality Date  . ABDOMINAL SURGERY     c-section  . CARDIAC SURGERY     open heart surgery   . CESAREAN SECTION    . CESAREAN SECTION N/A 07/28/2012   Procedure: CESAREAN SECTION;  Surgeon: Lenoard Aden, MD;  Location: [facility];  Service: Obstetrics;  Laterality: N/A;  Repeat C/S  EDD: 07/20/12    Family History  Problem Relation Age of Onset  . Peripheral vascular disease Mother   . Cancer Maternal Grandmother        breast    Social History  Substance Use Topics  . Smoking status: Never Smoker  . Smokeless tobacco: Never Used  . Alcohol use Yes     Comment: 1 q mth    Allergies:  Allergies  Allergen Reactions  . Codeine Hives    Prescriptions Prior to Admission  Medication Sig Dispense Refill Last Dose  . escitalopram (LEXAPRO) 10 MG tablet Take 10 mg by mouth daily.   Past Month at Unknown time  . Prenatal Vit-Fe Fumarate-FA (PRENATAL MULTIVITAMIN) TABS Take 1 tablet by mouth daily.   12/26/2016 at Unknown time  . acetaminophen (TYLENOL) 500 MG tablet Take 500 mg by  mouth. As needed for pain   Past Month at Unknown  . calcium carbonate (TUMS - DOSED IN MG ELEMENTAL CALCIUM) 500 MG chewable tablet As needed   07/27/2012 at Unknown  . docusate sodium 100 MG CAPS Take 100 mg by mouth 2 (two) times daily. 60 capsule 1   . famotidine (PEPCID) 10 MG tablet Take 10 mg by mouth 2 (two) times daily. heartburn   Past Week at Unknown  . HYDROcodone-acetaminophen (LORTAB) 7.5-500 MG per tablet Take 1-2 tablets by mouth every 6 (six) hours as needed for pain. 20 tablet 0   . ibuprofen (ADVIL,MOTRIN) 600 MG tablet Take 1 tablet (600 mg total) by mouth every 6 (six) hours as needed for pain. 30 tablet 1   . iron polysaccharides (NIFEREX) 150 MG capsule Take 1 capsule (150 mg  total) by mouth 2 (two) times daily. 60 capsule 2   . oseltamivir (TAMIFLU) 75 MG capsule Take 1 capsule (75 mg total) by mouth 2 (two) times daily. 10 capsule 0   . simethicone (MYLICON) 80 MG chewable tablet Chew 80 mg by mouth every 6 (six) hours as needed for flatulence.   07/27/2012 at Unknown    Review of Systems  Constitutional: Negative for chills and fever.  Gastrointestinal: Positive for abdominal pain and nausea. Negative for vomiting.  Genitourinary: Negative for pelvic pain, vaginal bleeding and vaginal discharge.  Neurological: Positive for headaches.   Physical Exam   Blood pressure (!) 119/57, pulse 76, temperature (!) 97.5 F (36.4 C), resp. rate 18, height 5\' 3"  (1.6 m), weight 278 lb (126.1 kg), last menstrual period 06/20/2016, unknown if currently breastfeeding.  Physical Exam  Nursing note and vitals reviewed. Constitutional: She is oriented to person, place, and time. She appears well-developed and well-nourished. No distress.  HENT:  Head: Normocephalic.  Cardiovascular: Normal rate.   Respiratory: Effort normal.  GI: There is no tenderness. There is no rebound.  Neurological: She is alert and oriented to person, place, and time.  Skin: Skin is warm and dry.  Psychiatric: She has a normal mood and affect.  FHT: 135, moderate with 10x10 accels, no decels Toco: no UCs   Results for orders placed or performed during the hospital encounter of 12/27/16 (from the past 24 hour(s))  Urinalysis, Routine w reflex microscopic     Status: Abnormal   Collection Time: 12/27/16  1:20 AM  Result Value Ref Range   Color, Urine YELLOW YELLOW   APPearance HAZY (A) CLEAR   Specific Gravity, Urine 1.025 1.005 - 1.030   pH 6.0 5.0 - 8.0   Glucose, UA NEGATIVE NEGATIVE mg/dL   Hgb urine dipstick NEGATIVE NEGATIVE   Bilirubin Urine NEGATIVE NEGATIVE   Ketones, ur NEGATIVE NEGATIVE mg/dL   Protein, ur 30 (A) NEGATIVE mg/dL   Nitrite NEGATIVE NEGATIVE   Leukocytes, UA LARGE  (A) NEGATIVE   RBC / HPF 0-5 0 - 5 RBC/hpf   WBC, UA 6-30 0 - 5 WBC/hpf   Bacteria, UA RARE (A) NONE SEEN   Squamous Epithelial / LPF 6-30 (A) NONE SEEN   Mucous PRESENT   Protein / creatinine ratio, urine     Status: None   Collection Time: 12/27/16  1:20 AM  Result Value Ref Range   Creatinine, Urine 248.00 mg/dL   Total Protein, Urine 26 mg/dL   Protein Creatinine Ratio 0.10 0.00 - 0.15 mg/mg[Cre]  CBC     Status: Abnormal   Collection Time: 12/27/16  2:18 AM  Result Value Ref  Range   WBC 8.3 4.0 - 10.5 K/uL   RBC 3.25 (L) 3.87 - 5.11 MIL/uL   Hemoglobin 8.9 (L) 12.0 - 15.0 g/dL   HCT 69.6 (L) 29.5 - 28.4 %   MCV 84.6 78.0 - 100.0 fL   MCH 27.4 26.0 - 34.0 pg   MCHC 32.4 30.0 - 36.0 g/dL   RDW 13.2 (H) 44.0 - 10.2 %   Platelets 236 150 - 400 K/uL  Comprehensive metabolic panel     Status: Abnormal   Collection Time: 12/27/16  2:18 AM  Result Value Ref Range   Sodium 134 (L) 135 - 145 mmol/L   Potassium 3.9 3.5 - 5.1 mmol/L   Chloride 104 101 - 111 mmol/L   CO2 22 22 - 32 mmol/L   Glucose, Bld 110 (H) 65 - 99 mg/dL   BUN 12 6 - 20 mg/dL   Creatinine, Ser 7.25 0.44 - 1.00 mg/dL   Calcium 8.3 (L) 8.9 - 10.3 mg/dL   Total Protein 6.4 (L) 6.5 - 8.1 g/dL   Albumin 2.8 (L) 3.5 - 5.0 g/dL   AST 14 (L) 15 - 41 U/L   ALT 9 (L) 14 - 54 U/L   Alkaline Phosphatase 87 38 - 126 U/L   Total Bilirubin 0.5 0.3 - 1.2 mg/dL   GFR calc non Af Amer >60 >60 mL/min   GFR calc Af Amer >60 >60 mL/min   Anion gap 8 5 - 15   US Abdomen Limited Ruq  Result Date: 12/27/2016 CLINICAL DATA:  36 year old pregnant female with right upper quadrant pain. EXAM: ULTRASOUND ABDOMEN LIMITED RIGHT UPPER QUADRANT COMPARISON:  Abdominal ultrasound 11/17/2012 FINDINGS: Gallbladder: There are numerous gravel-like gallstones within the gallbladder. No gallbladder wall thickening or pericholecystic fluid. No positive sonographic Eulah Pont sign was demonstrated. Common bile duct: Diameter: 3 mm. Liver: No focal  lesion identified. Within normal limits in parenchymal echogenicity. IMPRESSION: Numerous gravel-like gallstones without other evidence of acute cholecystitis. Electronically Signed   By: Deatra Robinson M.D.   On: 12/27/2016 03:05    MAU Course  Procedures  MDM 0325: D/W Dr. Billy Coast, ok for DC home  Assessment and Plan   1. Cholelithiasis affecting pregnancy in third trimester, antepartum   2. RUQ pain   3. [redacted] weeks gestation of pregnancy    DC home Comfort measures reviewed  3rd Trimester precautions  Low fat diet discussed  PTL precautions  Fetal kick counts RX: none  Return to MAU as needed FU with OB as planned  Follow-up Information    Olivia Mackie, MD Follow up.   Specialty:  Obstetrics and Gynecology Contact information: [address] [place] [address] [phone]            Thressa Sheller 12/27/2016, 2:07 AM

## 2016-12-27 NOTE — Progress Notes (Signed)
Heather Hogan CNM in to discuss test results and d/c plan. Written and verbal d/c instructions given and understanding voiced. 

## 2016-12-30 DIAGNOSIS — Z3689 Encounter for other specified antenatal screening: Secondary | ICD-10-CM | POA: Diagnosis not present

## 2016-12-30 DIAGNOSIS — Z3A27 27 weeks gestation of pregnancy: Secondary | ICD-10-CM | POA: Diagnosis not present

## 2016-12-30 DIAGNOSIS — R768 Other specified abnormal immunological findings in serum: Secondary | ICD-10-CM | POA: Diagnosis not present

## 2016-12-30 DIAGNOSIS — O09292 Supervision of pregnancy with other poor reproductive or obstetric history, second trimester: Secondary | ICD-10-CM | POA: Diagnosis not present

## 2017-01-15 DIAGNOSIS — Z3689 Encounter for other specified antenatal screening: Secondary | ICD-10-CM | POA: Diagnosis not present

## 2017-01-15 DIAGNOSIS — Z23 Encounter for immunization: Secondary | ICD-10-CM | POA: Diagnosis not present

## 2017-02-13 DIAGNOSIS — R768 Other specified abnormal immunological findings in serum: Secondary | ICD-10-CM | POA: Diagnosis not present

## 2017-02-16 ENCOUNTER — Inpatient Hospital Stay (HOSPITAL_COMMUNITY)
Admission: AD | Admit: 2017-02-16 | Discharge: 2017-02-16 | Disposition: A | Discharge status: Home / Self Care | Payer: 59 | Source: Ambulatory Visit | Attending: Obstetrics and Gynecology | Admitting: Obstetrics and Gynecology

## 2017-02-16 ENCOUNTER — Other Ambulatory Visit: Payer: Self-pay | Admitting: Obstetrics and Gynecology

## 2017-02-16 ENCOUNTER — Encounter (HOSPITAL_COMMUNITY): Payer: Self-pay | Admitting: *Deleted

## 2017-02-16 DIAGNOSIS — O99013 Anemia complicating pregnancy, third trimester: Secondary | ICD-10-CM | POA: Diagnosis not present

## 2017-02-16 DIAGNOSIS — Z3A39 39 weeks gestation of pregnancy: Secondary | ICD-10-CM | POA: Insufficient documentation

## 2017-02-16 DIAGNOSIS — D649 Anemia, unspecified: Secondary | ICD-10-CM | POA: Diagnosis not present

## 2017-02-16 DIAGNOSIS — O9913 Other diseases of the blood and blood-forming organs and certain disorders involving the immune mechanism complicating the puerperium: Secondary | ICD-10-CM | POA: Diagnosis not present

## 2017-02-16 MED ORDER — SODIUM CHLORIDE 0.9 % IV SOLN
510.0000 mg | Freq: Once | INTRAVENOUS | Status: AC
  Administered 2017-02-16: 510 mg via INTRAVENOUS
  Filled 2017-02-16: qty 17

## 2017-02-16 MED ORDER — SODIUM CHLORIDE 0.9 % IV SOLN
INTRAVENOUS | Status: DC
  Administered 2017-02-16: 11:00:00 via INTRAVENOUS

## 2017-02-16 NOTE — MAU Note (Signed)
Pt presents to MAU for FERAHEME infusion.

## 2017-02-18 DIAGNOSIS — Z3685 Encounter for antenatal screening for Streptococcus B: Secondary | ICD-10-CM | POA: Diagnosis not present

## 2017-02-19 ENCOUNTER — Other Ambulatory Visit: Payer: Self-pay | Admitting: Obstetrics and Gynecology

## 2017-02-23 ENCOUNTER — Encounter (HOSPITAL_COMMUNITY): Payer: Self-pay

## 2017-02-23 ENCOUNTER — Inpatient Hospital Stay (HOSPITAL_COMMUNITY)
Admission: AD | Admit: 2017-02-23 | Discharge: 2017-02-23 | Disposition: A | Discharge status: Home / Self Care | Payer: 59 | Source: Ambulatory Visit | Attending: Obstetrics and Gynecology | Admitting: Obstetrics and Gynecology

## 2017-02-23 DIAGNOSIS — Z3A35 35 weeks gestation of pregnancy: Secondary | ICD-10-CM | POA: Diagnosis not present

## 2017-02-23 DIAGNOSIS — O99013 Anemia complicating pregnancy, third trimester: Secondary | ICD-10-CM | POA: Diagnosis not present

## 2017-02-23 DIAGNOSIS — Z8279 Family history of other congenital malformations, deformations and chromosomal abnormalities: Secondary | ICD-10-CM

## 2017-02-23 MED ORDER — SODIUM CHLORIDE 0.9 % IV SOLN
510.0000 mg | Freq: Once | INTRAVENOUS | Status: AC
  Administered 2017-02-23: 510 mg via INTRAVENOUS
  Filled 2017-02-23: qty 17

## 2017-02-23 NOTE — MAU Note (Signed)
Pt presents for iron infusion. 

## 2017-03-03 DIAGNOSIS — O3663X0 Maternal care for excessive fetal growth, third trimester, not applicable or unspecified: Secondary | ICD-10-CM | POA: Diagnosis not present

## 2017-03-03 DIAGNOSIS — Z3A36 36 weeks gestation of pregnancy: Secondary | ICD-10-CM | POA: Diagnosis not present

## 2017-03-03 DIAGNOSIS — O99013 Anemia complicating pregnancy, third trimester: Secondary | ICD-10-CM | POA: Diagnosis not present

## 2017-03-11 DIAGNOSIS — O99013 Anemia complicating pregnancy, third trimester: Secondary | ICD-10-CM | POA: Diagnosis not present

## 2017-03-11 DIAGNOSIS — Z3A37 37 weeks gestation of pregnancy: Secondary | ICD-10-CM | POA: Diagnosis not present

## 2017-03-16 ENCOUNTER — Encounter (HOSPITAL_COMMUNITY): Payer: Self-pay

## 2017-03-17 ENCOUNTER — Encounter (HOSPITAL_COMMUNITY): Payer: Self-pay

## 2017-03-24 DIAGNOSIS — O368130 Decreased fetal movements, third trimester, not applicable or unspecified: Secondary | ICD-10-CM | POA: Diagnosis not present

## 2017-03-24 DIAGNOSIS — Z3A39 39 weeks gestation of pregnancy: Secondary | ICD-10-CM | POA: Diagnosis not present

## 2017-03-24 NOTE — Patient Instructions (Signed)
SCOTTLYN MCHANEY  03/24/2017   Your procedure is scheduled on:  03/26/2017  Enter through the Main Entrance of [facility] at 0930 AM.  Pick up the phone at the desk and dial [phone]  Call this number if you have problems the morning of surgery:[phone]  Remember:   Do not eat food:After Midnight.  Do not drink clear liquids: After Midnight.  Take these medicines the morning of surgery with A SIP OF WATER: may take your zantac   Do not wear jewelry, make-up or nail polish.  Do not wear lotions, powders, or perfumes. Do not wear deodorant.  Do not shave 48 hours prior to surgery.  Do not bring valuables to the hospital.  [facility] is not   responsible for any belongings or valuables brought to the hospital.  Contacts, dentures or bridgework may not be worn into surgery.  Leave suitcase in the car. After surgery it may be brought to your room.  For patients admitted to the hospital, checkout time is 11:00 AM the day of              discharge.    N/A   Please read over the following fact sheets that you were given:   Surgical Site Infection Prevention

## 2017-03-25 ENCOUNTER — Encounter (HOSPITAL_COMMUNITY)
Admission: RE | Admit: 2017-03-25 | Discharge: 2017-03-25 | Disposition: A | Discharge status: Home / Self Care | Payer: 59 | Source: Ambulatory Visit | Attending: Obstetrics and Gynecology | Admitting: Obstetrics and Gynecology

## 2017-03-25 DIAGNOSIS — O9902 Anemia complicating childbirth: Secondary | ICD-10-CM | POA: Diagnosis not present

## 2017-03-25 DIAGNOSIS — O99344 Other mental disorders complicating childbirth: Secondary | ICD-10-CM | POA: Diagnosis not present

## 2017-03-25 DIAGNOSIS — H9192 Unspecified hearing loss, left ear: Secondary | ICD-10-CM | POA: Diagnosis not present

## 2017-03-25 DIAGNOSIS — F419 Anxiety disorder, unspecified: Secondary | ICD-10-CM | POA: Diagnosis not present

## 2017-03-25 DIAGNOSIS — Z3A39 39 weeks gestation of pregnancy: Secondary | ICD-10-CM | POA: Diagnosis not present

## 2017-03-25 DIAGNOSIS — O34211 Maternal care for low transverse scar from previous cesarean delivery: Secondary | ICD-10-CM | POA: Diagnosis not present

## 2017-03-25 DIAGNOSIS — D649 Anemia, unspecified: Secondary | ICD-10-CM | POA: Diagnosis not present

## 2017-03-25 HISTORY — DX: Anemia, unspecified: D64.9

## 2017-03-25 HISTORY — DX: Fracture of coccyx, initial encounter for closed fracture: S32.2XXA

## 2017-03-25 LAB — CBC
HCT: 33.6 % — ABNORMAL LOW (ref 36.0–46.0)
Hemoglobin: 10.8 g/dL — ABNORMAL LOW (ref 12.0–15.0)
MCH: 28.4 pg (ref 26.0–34.0)
MCHC: 32.1 g/dL (ref 30.0–36.0)
MCV: 88.4 fL (ref 78.0–100.0)
PLATELETS: 267 10*3/uL (ref 150–400)
RBC: 3.8 MIL/uL — AB (ref 3.87–5.11)
RDW: 18.6 % — AB (ref 11.5–15.5)
WBC: 8.6 10*3/uL (ref 4.0–10.5)

## 2017-03-26 ENCOUNTER — Inpatient Hospital Stay (HOSPITAL_COMMUNITY): Payer: 59 | Admitting: Certified Registered Nurse Anesthetist

## 2017-03-26 ENCOUNTER — Encounter (HOSPITAL_COMMUNITY): Payer: Self-pay | Admitting: *Deleted

## 2017-03-26 ENCOUNTER — Inpatient Hospital Stay (HOSPITAL_COMMUNITY)
Admission: RE | Admit: 2017-03-26 | Discharge: 2017-03-29 | DRG: 788 | Disposition: A | Discharge status: Home / Self Care | Payer: 59 | Source: Ambulatory Visit | Attending: Obstetrics and Gynecology | Admitting: Obstetrics and Gynecology

## 2017-03-26 ENCOUNTER — Encounter (HOSPITAL_COMMUNITY)
Admission: RE | Disposition: A | Discharge status: Home / Self Care | Payer: Self-pay | Source: Ambulatory Visit | Attending: Obstetrics and Gynecology

## 2017-03-26 DIAGNOSIS — Z3A39 39 weeks gestation of pregnancy: Secondary | ICD-10-CM

## 2017-03-26 DIAGNOSIS — D649 Anemia, unspecified: Secondary | ICD-10-CM | POA: Diagnosis present

## 2017-03-26 DIAGNOSIS — O34211 Maternal care for low transverse scar from previous cesarean delivery: Principal | ICD-10-CM | POA: Diagnosis present

## 2017-03-26 DIAGNOSIS — H9192 Unspecified hearing loss, left ear: Secondary | ICD-10-CM | POA: Diagnosis present

## 2017-03-26 DIAGNOSIS — O34219 Maternal care for unspecified type scar from previous cesarean delivery: Secondary | ICD-10-CM | POA: Diagnosis not present

## 2017-03-26 DIAGNOSIS — F419 Anxiety disorder, unspecified: Secondary | ICD-10-CM | POA: Diagnosis present

## 2017-03-26 DIAGNOSIS — O9902 Anemia complicating childbirth: Secondary | ICD-10-CM | POA: Diagnosis present

## 2017-03-26 DIAGNOSIS — Z3A00 Weeks of gestation of pregnancy not specified: Secondary | ICD-10-CM | POA: Diagnosis not present

## 2017-03-26 DIAGNOSIS — R768 Other specified abnormal immunological findings in serum: Secondary | ICD-10-CM | POA: Diagnosis present

## 2017-03-26 DIAGNOSIS — O99344 Other mental disorders complicating childbirth: Secondary | ICD-10-CM | POA: Diagnosis present

## 2017-03-26 DIAGNOSIS — Q244 Congenital subaortic stenosis: Secondary | ICD-10-CM

## 2017-03-26 DIAGNOSIS — Z98891 History of uterine scar from previous surgery: Secondary | ICD-10-CM

## 2017-03-26 DIAGNOSIS — R7689 Other specified abnormal immunological findings in serum: Secondary | ICD-10-CM | POA: Diagnosis present

## 2017-03-26 LAB — RPR: RPR Ser Ql: NONREACTIVE

## 2017-03-26 SURGERY — Surgical Case
Anesthesia: Spinal | Site: Abdomen | Wound class: Clean Contaminated

## 2017-03-26 MED ORDER — ACETAMINOPHEN 325 MG PO TABS
650.0000 mg | ORAL_TABLET | ORAL | Status: DC | PRN
  Filled 2017-03-26: qty 2

## 2017-03-26 MED ORDER — METHYLERGONOVINE MALEATE 0.2 MG/ML IJ SOLN: 0.2000 mg | INTRAMUSCULAR | Status: DC | PRN

## 2017-03-26 MED ORDER — NALBUPHINE HCL 10 MG/ML IJ SOLN: 5.0000 mg | Freq: Once | INTRAMUSCULAR | Status: DC | PRN

## 2017-03-26 MED ORDER — NALOXONE HCL 2 MG/2ML IJ SOSY
1.0000 ug/kg/h | PREFILLED_SYRINGE | INTRAVENOUS | Status: DC | PRN
  Filled 2017-03-26: qty 2

## 2017-03-26 MED ORDER — CEFAZOLIN SODIUM-DEXTROSE 2-4 GM/100ML-% IV SOLN: 2.0000 g | INTRAVENOUS | Status: DC

## 2017-03-26 MED ORDER — BUPIVACAINE HCL (PF) 0.25 % IJ SOLN
INTRAMUSCULAR | Status: DC | PRN
  Administered 2017-03-26: 10 mL

## 2017-03-26 MED ORDER — METOCLOPRAMIDE HCL 5 MG/ML IJ SOLN
INTRAMUSCULAR | Status: DC | PRN
  Administered 2017-03-26: 10 mg via INTRAVENOUS

## 2017-03-26 MED ORDER — TETANUS-DIPHTH-ACELL PERTUSSIS 5-2.5-18.5 LF-MCG/0.5 IM SUSP: 0.5000 mL | Freq: Once | INTRAMUSCULAR | Status: DC

## 2017-03-26 MED ORDER — BUPIVACAINE HCL (PF) 0.25 % IJ SOLN
INTRAMUSCULAR | Status: AC
  Filled 2017-03-26: qty 10

## 2017-03-26 MED ORDER — DIBUCAINE 1 % RE OINT: 1.0000 "application " | TOPICAL_OINTMENT | RECTAL | Status: DC | PRN

## 2017-03-26 MED ORDER — KETOROLAC TROMETHAMINE 30 MG/ML IJ SOLN
30.0000 mg | Freq: Four times a day (QID) | INTRAMUSCULAR | Status: AC | PRN
  Administered 2017-03-26: 30 mg via INTRAVENOUS
  Filled 2017-03-26: qty 1

## 2017-03-26 MED ORDER — SENNOSIDES-DOCUSATE SODIUM 8.6-50 MG PO TABS
2.0000 | ORAL_TABLET | ORAL | Status: DC
  Administered 2017-03-26 – 2017-03-27 (×2): 2 via ORAL
  Filled 2017-03-26 (×2): qty 2

## 2017-03-26 MED ORDER — SCOPOLAMINE 1 MG/3DAYS TD PT72: 1.0000 | MEDICATED_PATCH | Freq: Once | TRANSDERMAL | Status: DC

## 2017-03-26 MED ORDER — DIPHENHYDRAMINE HCL 25 MG PO CAPS
25.0000 mg | ORAL_CAPSULE | ORAL | Status: DC | PRN
Start: 2017-03-26 — End: 2017-03-29

## 2017-03-26 MED ORDER — OXYTOCIN 40 UNITS IN LACTATED RINGERS INFUSION - SIMPLE MED: 2.5000 [IU]/h | INTRAVENOUS | Status: AC

## 2017-03-26 MED ORDER — KETOROLAC TROMETHAMINE 30 MG/ML IJ SOLN: 30.0000 mg | Freq: Four times a day (QID) | INTRAMUSCULAR | Status: AC | PRN

## 2017-03-26 MED ORDER — DIPHENHYDRAMINE HCL 50 MG/ML IJ SOLN: 12.5000 mg | INTRAMUSCULAR | Status: DC | PRN

## 2017-03-26 MED ORDER — WITCH HAZEL-GLYCERIN EX PADS: 1.0000 "application " | MEDICATED_PAD | CUTANEOUS | Status: DC | PRN

## 2017-03-26 MED ORDER — OXYCODONE HCL 5 MG PO TABS: 5.0000 mg | ORAL_TABLET | Freq: Once | ORAL | Status: DC | PRN

## 2017-03-26 MED ORDER — LACTATED RINGERS IV SOLN
INTRAVENOUS | Status: DC
  Administered 2017-03-26: 12:00:00 via INTRAVENOUS
  Administered 2017-03-26: 125 mL via INTRAVENOUS
  Administered 2017-03-26: 12:00:00 via INTRAVENOUS

## 2017-03-26 MED ORDER — FENTANYL CITRATE (PF) 100 MCG/2ML IJ SOLN
INTRAMUSCULAR | Status: DC | PRN
  Administered 2017-03-26: 10 ug via INTRATHECAL

## 2017-03-26 MED ORDER — OXYCODONE-ACETAMINOPHEN 5-325 MG PO TABS
1.0000 | ORAL_TABLET | ORAL | Status: DC | PRN
  Administered 2017-03-27 – 2017-03-29 (×5): 1 via ORAL
  Filled 2017-03-26 (×5): qty 1

## 2017-03-26 MED ORDER — SIMETHICONE 80 MG PO CHEW: 80.0000 mg | CHEWABLE_TABLET | ORAL | Status: DC | PRN

## 2017-03-26 MED ORDER — BUPIVACAINE IN DEXTROSE 0.75-8.25 % IT SOLN
INTRATHECAL | Status: AC
  Filled 2017-03-26: qty 2

## 2017-03-26 MED ORDER — MORPHINE SULFATE (PF) 0.5 MG/ML IJ SOLN
INTRAMUSCULAR | Status: DC | PRN
Start: 2017-03-26 — End: 2017-03-26

## 2017-03-26 MED ORDER — SCOPOLAMINE 1 MG/3DAYS TD PT72
MEDICATED_PATCH | TRANSDERMAL | Status: AC
Start: 2017-03-26 — End: 2017-03-26
  Administered 2017-03-26: 11:00:00
  Filled 2017-03-26: qty 1

## 2017-03-26 MED ORDER — NALBUPHINE HCL 10 MG/ML IJ SOLN: 5.0000 mg | INTRAMUSCULAR | Status: DC | PRN

## 2017-03-26 MED ORDER — OXYTOCIN 10 UNIT/ML IJ SOLN
INTRAVENOUS | Status: DC | PRN
  Administered 2017-03-26: 40 [IU] via INTRAVENOUS

## 2017-03-26 MED ORDER — ONDANSETRON HCL 4 MG/2ML IJ SOLN: 4.0000 mg | Freq: Three times a day (TID) | INTRAMUSCULAR | Status: DC | PRN

## 2017-03-26 MED ORDER — SIMETHICONE 80 MG PO CHEW
80.0000 mg | CHEWABLE_TABLET | ORAL | Status: DC
  Administered 2017-03-26 – 2017-03-28 (×3): 80 mg via ORAL
  Filled 2017-03-26 (×3): qty 1

## 2017-03-26 MED ORDER — OXYCODONE-ACETAMINOPHEN 5-325 MG PO TABS: 2.0000 | ORAL_TABLET | ORAL | Status: DC | PRN

## 2017-03-26 MED ORDER — MEPERIDINE HCL 25 MG/ML IJ SOLN: 6.2500 mg | INTRAMUSCULAR | Status: DC | PRN

## 2017-03-26 MED ORDER — BUPIVACAINE HCL (PF) 0.25 % IJ SOLN
INTRAMUSCULAR | Status: AC
  Filled 2017-03-26: qty 20

## 2017-03-26 MED ORDER — DEXAMETHASONE SODIUM PHOSPHATE 4 MG/ML IJ SOLN
INTRAMUSCULAR | Status: DC | PRN
  Administered 2017-03-26: 4 mg via INTRAVENOUS

## 2017-03-26 MED ORDER — SODIUM BICARBONATE 8.4 % IV SOLN
INTRAVENOUS | Status: AC
  Filled 2017-03-26: qty 50

## 2017-03-26 MED ORDER — COCONUT OIL OIL
1.0000 "application " | TOPICAL_OIL | Status: DC | PRN
  Administered 2017-03-27: 1 via TOPICAL
  Filled 2017-03-26 (×2): qty 120

## 2017-03-26 MED ORDER — DEXTROSE 5 % IV SOLN
3.0000 g | INTRAVENOUS | Status: AC
  Administered 2017-03-26: 3 g via INTRAVENOUS
  Filled 2017-03-26 (×2): qty 3000

## 2017-03-26 MED ORDER — PHENYLEPHRINE HCL 10 MG/ML IJ SOLN
INTRAMUSCULAR | Status: DC | PRN
  Administered 2017-03-26 (×9): 80 ug via INTRAVENOUS

## 2017-03-26 MED ORDER — NALOXONE HCL 0.4 MG/ML IJ SOLN: 0.4000 mg | INTRAMUSCULAR | Status: DC | PRN

## 2017-03-26 MED ORDER — PROMETHAZINE HCL 25 MG/ML IJ SOLN: 6.2500 mg | INTRAMUSCULAR | Status: DC | PRN

## 2017-03-26 MED ORDER — OXYCODONE HCL 5 MG/5ML PO SOLN: 5.0000 mg | Freq: Once | ORAL | Status: DC | PRN

## 2017-03-26 MED ORDER — SODIUM CHLORIDE 0.9% FLUSH: 3.0000 mL | INTRAVENOUS | Status: DC | PRN

## 2017-03-26 MED ORDER — MENTHOL 3 MG MT LOZG: 1.0000 | LOZENGE | OROMUCOSAL | Status: DC | PRN

## 2017-03-26 MED ORDER — PRENATAL MULTIVITAMIN CH
1.0000 | ORAL_TABLET | Freq: Every day | ORAL | Status: DC
  Administered 2017-03-27 – 2017-03-29 (×3): 1 via ORAL
  Filled 2017-03-26 (×3): qty 1

## 2017-03-26 MED ORDER — PHENYLEPHRINE 40 MCG/ML (10ML) SYRINGE FOR IV PUSH (FOR BLOOD PRESSURE SUPPORT)
PREFILLED_SYRINGE | INTRAVENOUS | Status: AC
  Filled 2017-03-26: qty 20

## 2017-03-26 MED ORDER — IBUPROFEN 600 MG PO TABS
600.0000 mg | ORAL_TABLET | Freq: Four times a day (QID) | ORAL | Status: DC
  Administered 2017-03-26 – 2017-03-29 (×11): 600 mg via ORAL
  Filled 2017-03-26 (×11): qty 1

## 2017-03-26 MED ORDER — ONDANSETRON HCL 4 MG/2ML IJ SOLN
INTRAMUSCULAR | Status: DC | PRN
Start: 2017-03-26 — End: 2017-03-26
  Administered 2017-03-26: 4 mg via INTRAVENOUS

## 2017-03-26 MED ORDER — ZOLPIDEM TARTRATE 5 MG PO TABS: 5.0000 mg | ORAL_TABLET | Freq: Every evening | ORAL | Status: DC | PRN

## 2017-03-26 MED ORDER — LACTATED RINGERS IV SOLN
INTRAVENOUS | Status: DC | PRN
  Administered 2017-03-26: 12:00:00 via INTRAVENOUS

## 2017-03-26 MED ORDER — FENTANYL CITRATE (PF) 100 MCG/2ML IJ SOLN
INTRAMUSCULAR | Status: AC
  Filled 2017-03-26: qty 2

## 2017-03-26 MED ORDER — PHENYLEPHRINE 8 MG IN D5W 100 ML (0.08MG/ML) PREMIX OPTIME
INJECTION | INTRAVENOUS | Status: DC | PRN
  Administered 2017-03-26: 40 ug/min via INTRAVENOUS

## 2017-03-26 MED ORDER — FENTANYL CITRATE (PF) 100 MCG/2ML IJ SOLN: 25.0000 ug | INTRAMUSCULAR | Status: DC | PRN

## 2017-03-26 MED ORDER — MORPHINE SULFATE (PF) 0.5 MG/ML IJ SOLN
INTRAMUSCULAR | Status: AC
  Filled 2017-03-26: qty 10

## 2017-03-26 MED ORDER — METHYLERGONOVINE MALEATE 0.2 MG PO TABS: 0.2000 mg | ORAL_TABLET | ORAL | Status: DC | PRN

## 2017-03-26 MED ORDER — ONDANSETRON HCL 4 MG/2ML IJ SOLN: 4.0000 mg | Freq: Four times a day (QID) | INTRAMUSCULAR | Status: DC | PRN

## 2017-03-26 MED ORDER — OXYTOCIN 10 UNIT/ML IJ SOLN
INTRAMUSCULAR | Status: AC
  Filled 2017-03-26: qty 4

## 2017-03-26 MED ORDER — PROMETHAZINE HCL 25 MG/ML IJ SOLN
INTRAMUSCULAR | Status: AC
  Filled 2017-03-26: qty 1

## 2017-03-26 MED ORDER — LACTATED RINGERS IV SOLN
INTRAVENOUS | Status: DC
  Administered 2017-03-26: 19:00:00 via INTRAVENOUS

## 2017-03-26 MED ORDER — MORPHINE SULFATE (PF) 0.5 MG/ML IJ SOLN
INTRAMUSCULAR | Status: DC | PRN
  Administered 2017-03-26: .2 mg via INTRATHECAL

## 2017-03-26 MED ORDER — BUPIVACAINE IN DEXTROSE 0.75-8.25 % IT SOLN
INTRATHECAL | Status: DC | PRN
  Administered 2017-03-26: 1.8 mL via INTRATHECAL

## 2017-03-26 MED ORDER — DIPHENHYDRAMINE HCL 25 MG PO CAPS: 25.0000 mg | ORAL_CAPSULE | Freq: Four times a day (QID) | ORAL | Status: DC | PRN

## 2017-03-26 MED ORDER — DEXAMETHASONE SODIUM PHOSPHATE 4 MG/ML IJ SOLN
INTRAMUSCULAR | Status: AC
Start: 2017-03-26 — End: ?
  Filled 2017-03-26: qty 1

## 2017-03-26 MED ORDER — ONDANSETRON HCL 4 MG/2ML IJ SOLN
INTRAMUSCULAR | Status: AC
  Filled 2017-03-26: qty 2

## 2017-03-26 MED ORDER — SIMETHICONE 80 MG PO CHEW
80.0000 mg | CHEWABLE_TABLET | Freq: Three times a day (TID) | ORAL | Status: DC
  Administered 2017-03-27 – 2017-03-29 (×7): 80 mg via ORAL
  Filled 2017-03-26 (×7): qty 1

## 2017-03-26 SURGICAL SUPPLY — 40 items
CHLORAPREP W/TINT 26ML (MISCELLANEOUS) ×3 IMPLANT
CLAMP CORD UMBIL (MISCELLANEOUS) IMPLANT
CLOTH BEACON ORANGE TIMEOUT ST (SAFETY) ×3 IMPLANT
CONTAINER PREFILL 10% NBF 15ML (MISCELLANEOUS) IMPLANT
DERMABOND ADVANCED (GAUZE/BANDAGES/DRESSINGS) ×2
DERMABOND ADVANCED .7 DNX12 (GAUZE/BANDAGES/DRESSINGS) ×1 IMPLANT
DRAPE C SECTION CLR SCREEN (DRAPES) ×3 IMPLANT
DRSG OPSITE POSTOP 4X10 (GAUZE/BANDAGES/DRESSINGS) ×3 IMPLANT
ELECT REM PT RETURN 9FT ADLT (ELECTROSURGICAL) ×3
ELECTRODE REM PT RTRN 9FT ADLT (ELECTROSURGICAL) ×1 IMPLANT
EXTRACTOR VACUUM M CUP 4 TUBE (SUCTIONS) IMPLANT
EXTRACTOR VACUUM M CUP 4' TUBE (SUCTIONS)
GAUZE SPONGE 4X4 12PLY STRL LF (GAUZE/BANDAGES/DRESSINGS) ×3 IMPLANT
GLOVE BIO SURGEON STRL SZ7.5 (GLOVE) ×3 IMPLANT
GLOVE BIOGEL PI IND STRL 7.0 (GLOVE) ×1 IMPLANT
GLOVE BIOGEL PI INDICATOR 7.0 (GLOVE) ×2
GOWN STRL REUS W/TWL LRG LVL3 (GOWN DISPOSABLE) ×6 IMPLANT
KIT ABG SYR 3ML LUER SLIP (SYRINGE) IMPLANT
NEEDLE HYPO 22GX1.5 SAFETY (NEEDLE) ×3 IMPLANT
NEEDLE HYPO 25X5/8 SAFETYGLIDE (NEEDLE) IMPLANT
NEEDLE SPNL 20GX3.5 QUINCKE YW (NEEDLE) IMPLANT
NS IRRIG 1000ML POUR BTL (IV SOLUTION) ×3 IMPLANT
PACK C SECTION WH (CUSTOM PROCEDURE TRAY) ×3 IMPLANT
PAD ABD 8X10 STRL (GAUZE/BANDAGES/DRESSINGS) ×3 IMPLANT
PENCIL SMOKE EVAC W/HOLSTER (ELECTROSURGICAL) ×3 IMPLANT
RETRACTOR TRAXI PANNICULUS (MISCELLANEOUS) ×1 IMPLANT
SUT MNCRL 0 VIOLET CTX 36 (SUTURE) ×2 IMPLANT
SUT MNCRL AB 3-0 PS2 27 (SUTURE) IMPLANT
SUT MON AB 2-0 CT1 27 (SUTURE) ×3 IMPLANT
SUT MON AB-0 CT1 36 (SUTURE) ×6 IMPLANT
SUT MONOCRYL 0 CTX 36 (SUTURE) ×4
SUT PLAIN 0 NONE (SUTURE) IMPLANT
SUT PLAIN 2 0 (SUTURE)
SUT PLAIN 2 0 XLH (SUTURE) IMPLANT
SUT PLAIN ABS 2-0 CT1 27XMFL (SUTURE) IMPLANT
SYR 20CC LL (SYRINGE) IMPLANT
SYR CONTROL 10ML LL (SYRINGE) ×3 IMPLANT
TOWEL OR 17X24 6PK STRL BLUE (TOWEL DISPOSABLE) ×3 IMPLANT
TRAXI PANNICULUS RETRACTOR (MISCELLANEOUS) ×2
TRAY FOLEY BAG SILVER LF 14FR (SET/KITS/TRAYS/PACK) ×3 IMPLANT

## 2017-03-26 NOTE — Anesthesia Postprocedure Evaluation (Signed)
Anesthesia Post Note  Patient: [name]  Procedure(s) Performed: Repeat CESAREAN SECTION (N/A Abdomen)     Patient location during evaluation: PACU Anesthesia Type: Spinal Level of consciousness: oriented and awake and alert Pain management: pain level controlled Vital Signs Assessment: post-procedure vital signs reviewed and stable Respiratory status: spontaneous breathing, respiratory function stable and patient connected to nasal cannula oxygen Cardiovascular status: blood pressure returned to baseline and stable Postop Assessment: no headache, no backache and no apparent nausea or vomiting Anesthetic complications: no    Last Vitals:  Vitals:   03/26/17 1345 03/26/17 1405  BP: (!) 85/71 (!) 97/57  Pulse: 66 72  Resp: (!) 21 18  Temp:  36.8 C  SpO2: 100% 100%    Last Pain:  Vitals:   03/26/17 1405  TempSrc: Oral   Pain Goal:                 Socorro Ebron S

## 2017-03-26 NOTE — Op Note (Signed)
Cesarean Section Procedure Note  Indications: previous uterine incision kerr x2  Pre-operative Diagnosis: 39 week 5 day pregnancy.  Post-operative Diagnosis: same  Surgeon: Lenoard AdenAAVON,Marcille Barman J   Assistants: Montez HagemanBailey, FACM, CNM  Anesthesia: Local anesthesia 0.25.% bupivacaine and Spinal anesthesia  ASA Class: 2  Procedure Details  The patient was seen in the Holding Room. The risks, benefits, complications, treatment options, and expected outcomes were discussed with the patient.  The patient concurred with the proposed plan, giving informed consent. The risks of anesthesia, infection, bleeding and possible injury to other organs discussed. Injury to bowel, bladder, or ureter with possible need for repair discussed. Possible need for transfusion with secondary risks of hepatitis or HIV acquisition discussed. Post operative complications to include but not limited to DVT, PE and Pneumonia noted. The site of surgery properly noted/marked. The patient was taken to Operating Room # 9, identified as Hurshel Partyshley R Kroft and the procedure verified as C-Section Delivery. A Time Out was held and the above information confirmed.  After induction of anesthesia, the patient was draped and prepped in the usual sterile manner. A Pfannenstiel incision was made and carried down through the subcutaneous tissue to the fascia. Fascial incision was made and extended transversely using Mayo scissors. The fascia was separated from the underlying rectus tissue superiorly and inferiorly. The peritoneum was identified and entered. Peritoneal incision was extended longitudinally.  Omental adhesions to anterior abdominal wall were sharply lysed. The utero-vesical peritoneal reflection was incised transversely and the bladder flap was bluntly and sharply freed from the lower and mid lower uterine segment. A low transverse uterine incision(Kerr hysterotomy) was made. Delivered from OA presentation was a  female with Apgar scores of 8  at one minute and 9 at five minutes. Bulb suctioning gently performed. Neonatal team in attendance.After the umbilical cord was clamped and cut cord blood was obtained for evaluation. The placenta was removed intact and appeared normal. The uterus was curetted with a dry lap pack. Good hemostasis was noted.The uterine outline, tubes and ovaries appeared normal. The uterine incision was closed with running locked sutures of 0 Monocryl x 2 layers. Hemostasis was observed. Lavage was carried out until clear.The parietal peritoneum was closed with a running 2-0 Monocryl suture. The fascia was then reapproximated with running sutures of 0 Monocryl. The skin was reapproximated with 3-0 monocryl after Hardwick closure with 2-0 plain..  Instrument, sponge, and needle counts were correct prior the abdominal closure and at the conclusion of the case.   Findings: FTLM, post placenta , nl adnexa Omental and bladder flap adhesions  Estimated Blood Loss:  500         Drains: foley                 Specimens: placenta                 Complications:  None; patient tolerated the procedure well.         Disposition: PACU - hemodynamically stable.         Condition: stable  Attending Attestation: I performed the procedure.

## 2017-03-26 NOTE — Progress Notes (Signed)
Patient ID: [name], female   DOB: 1980/08/28, 36 y.o.   MRN: [id number] Patient seen and examined. Consent witnessed and signed. No changes noted. Update completed.\ BP 98/62   Pulse 100   Temp 97.9 F (36.6 C) (Oral)   Resp 20   Ht 5\' 3"  (1.6 m)   Wt 123.8 kg (273 lb)   LMP 06/20/2016   BMI 48.36 kg/m   CBC    Component Value Date/Time   WBC 8.6 03/25/2017 1047   RBC 3.80 (L) 03/25/2017 1047   HGB 10.8 (L) 03/25/2017 1047   HCT 33.6 (L) 03/25/2017 1047   PLT 267 03/25/2017 1047   MCV 88.4 03/25/2017 1047   MCH 28.4 03/25/2017 1047   MCHC 32.1 03/25/2017 1047   RDW 18.6 (H) 03/25/2017 1047   LYMPHSABS 3.2 04/02/2012 0328   MONOABS 0.5 04/02/2012 0328   EOSABS 0.1 04/02/2012 0328   BASOSABS 0.0 04/02/2012 0328

## 2017-03-26 NOTE — Anesthesia Postprocedure Evaluation (Signed)
Anesthesia Post Note  Patient: [name]  Procedure(s) Performed: Repeat CESAREAN SECTION (N/A Abdomen)     Patient location during evaluation: Mother Baby Anesthesia Type: Spinal Level of consciousness: awake Pain management: satisfactory to patient Vital Signs Assessment: post-procedure vital signs reviewed and stable Respiratory status: spontaneous breathing Cardiovascular status: stable Anesthetic complications: no    Last Vitals:  Vitals:   03/26/17 1345 03/26/17 1405  BP: (!) 85/71 (!) 97/57  Pulse: 66 72  Resp: (!) 21 18  Temp:  36.8 C  SpO2: 100% 100%    Last Pain:  Vitals:   03/26/17 1405  TempSrc: Oral   Pain Goal:                 KeyCorpBURGER,[name]

## 2017-03-26 NOTE — H&P (Addendum)
[name] is a 36 y.o. female presenting for repeat csection. OB History    Gravida Para Term Preterm AB Living   4 2 2   1 2    SAB TAB Ectopic Multiple Live Births   1       2     Past Medical History:  Diagnosis Date  . Anemia   . Anti-Duffy antibodies present   . Deafness    LT ear since birth   . Deafness in left ear   . Fractured coccyx (HCC)   . Hx of varicella   . Subaortic stenosis    repaired   Past Surgical History:  Procedure Laterality Date  . CARDIAC SURGERY     open heart surgery   . CESAREAN SECTION    . CESAREAN SECTION N/A 07/28/2012   Procedure: CESAREAN SECTION;  Surgeon: Lenoard Aden, MD;  Location: [facility];  Service: Obstetrics;  Laterality: N/A;  Repeat C/S  EDD: 07/20/12   Family History: family history includes Anemia in her mother; Anxiety disorder in her mother; Cancer in her maternal grandmother; Depression in her mother; GER disease in her mother; Heart attack in her father; Peripheral vascular disease in her mother; Varicose Veins in her mother. Social History:  reports that she has never smoked. She has never used smokeless tobacco. She reports that she drinks alcohol. She reports that she does not use drugs.     Maternal Diabetes: No Genetic Screening: Normal Maternal Ultrasounds/Referrals: Normal Fetal Ultrasounds or other Referrals:  None Maternal Substance Abuse:  No Significant Maternal Medications:  None Significant Maternal Lab Results:  None Other Comments:  Pos ab screen, insignificant titer  Review of Systems  Constitutional: Negative.   All other systems reviewed and are negative.  Maternal Medical History:  Fetal activity: Perceived fetal activity is normal.   Last perceived fetal movement was within the past hour.    Prenatal complications: no prenatal complications Prenatal Complications - Diabetes: none.      Last menstrual period 06/20/2016, unknown if currently breastfeeding. Maternal Exam:  Uterine  Assessment: Contraction strength is mild.  Contraction frequency is rare.   Abdomen: Patient reports no abdominal tenderness. Surgical scars: low transverse.   Fetal presentation: vertex  Introitus: Normal vulva. Normal vagina.  Ferning test: not done.  Nitrazine test: not done. Amniotic fluid character: not assessed.  Pelvis: of concern for delivery.   Cervix: Cervix evaluated by digital exam.     Physical Exam  Nursing note and vitals reviewed. Constitutional: She is oriented to person, place, and time. She appears well-developed and well-nourished.  HENT:  Head: Normocephalic and atraumatic.  Neck: Normal range of motion. Neck supple.  Cardiovascular: Normal rate and regular rhythm.   Respiratory: Effort normal and breath sounds normal.  GI: Soft. Bowel sounds are normal.  Genitourinary: Vagina normal and uterus normal.  Musculoskeletal: Normal range of motion.  Neurological: She is alert and oriented to person, place, and time. She has normal reflexes.  Skin: Skin is warm and dry.  Psychiatric: She has a normal mood and affect.    Prenatal labs: ABO, Rh: --/--/A POS (10/17 1039) Antibody: POS (10/17 1039) Rubella: Immune (03/08 0000) RPR: Non Reactive (10/17 1047)  HBsAg: Negative (03/08 0000)  HIV: Non-reactive (03/08 0000)  GBS:     Assessment/Plan: 39 week IUP Previous csection x 2 Pos ab screen with non critical titers History of maternal cardiac anomaly- nl fetal echo done History of anemia with Fe transfusion this pregnancy.  Rpt csection . Consent done.   [name] J 03/26/2017, 7:01 AM

## 2017-03-26 NOTE — Transfer of Care (Cosign Needed)
Immediate Anesthesia Transfer of Care Note  Patient: [name]  Procedure(s) Performed: Repeat CESAREAN SECTION (N/A Abdomen)  Patient Location: PACU  Anesthesia Type:Spinal  Level of Consciousness: awake, alert  and oriented  Airway & Oxygen Therapy: Patient Spontanous Breathing and Patient connected to nasal cannula oxygen  Post-op Assessment: Report given to RN and Post -op Vital signs reviewed and stable  Post vital signs: Reviewed and stable  Last Vitals:  Vitals:   03/26/17 1011  BP: 98/62  Pulse: 100  Resp: 20  Temp: 36.6 C    Last Pain:  Vitals:   03/26/17 1011  TempSrc: Oral         Complications: No apparent anesthesia complications

## 2017-03-26 NOTE — Anesthesia Procedure Notes (Signed)
Spinal  Patient location during procedure: OR Start time: 03/26/2017 11:23 AM End time: 03/26/2017 11:35 AM Staffing Anesthesiologist: Chaney MallingHODIERNE, Aanvi Voyles Performed: anesthesiologist and other anesthesia staff  Preanesthetic Checklist Completed: patient identified, surgical consent, pre-op evaluation, timeout performed, IV checked, risks and benefits discussed and monitors and equipment checked Spinal Block Patient position: sitting Prep: DuraPrep Patient monitoring: cardiac monitor, continuous pulse ox and blood pressure Approach: midline Location: L3-4 Injection technique: single-shot Needle Needle type: Pencan  Needle gauge: 24 G Needle length: 9 cm Assessment Sensory level: T10 Additional Notes Functioning IV was confirmed and monitors were applied. Sterile prep and drape, including hand hygiene and sterile gloves were used. The patient was positioned and the spine was prepped. The skin was anesthetized with lidocaine.  Free flow of clear CSF was obtained prior to injecting local anesthetic into the CSF.  The spinal needle aspirated freely following injection.  The needle was carefully withdrawn.  The patient tolerated the procedure well.

## 2017-03-26 NOTE — Progress Notes (Signed)
Clarified order for percocet with Marlinda Mikeanya Bailey cmw, states patient has had it before.

## 2017-03-26 NOTE — Anesthesia Preprocedure Evaluation (Signed)
Anesthesia Evaluation  Patient identified by MRN, date of birth, ID band Patient awake    Reviewed: Allergy & Precautions, H&P , NPO status , Patient's Chart, lab work & pertinent test results  Airway Mallampati: II   Neck ROM: full    Dental   Pulmonary neg pulmonary ROS,    breath sounds clear to auscultation       Cardiovascular negative cardio ROS   Rhythm:regular Rate:Normal     Neuro/Psych    GI/Hepatic   Endo/Other    Renal/GU      Musculoskeletal   Abdominal   Peds  Hematology  (+) anemia ,   Anesthesia Other Findings   Reproductive/Obstetrics (+) Pregnancy                             Anesthesia Physical Anesthesia Plan  ASA: II  Anesthesia Plan: Spinal   Post-op Pain Management:    Induction: Intravenous  PONV Risk Score and Plan: 2 and Ondansetron, Dexamethasone and Treatment may vary due to age or medical condition  Airway Management Planned: Simple Face Mask  Additional Equipment:   Intra-op Plan:   Post-operative Plan:   Informed Consent: I have reviewed the patients History and Physical, chart, labs and discussed the procedure including the risks, benefits and alternatives for the proposed anesthesia with the patient or authorized representative who has indicated his/her understanding and acceptance.     Plan Discussed with: CRNA, Anesthesiologist and Surgeon  Anesthesia Plan Comments:         Anesthesia Quick Evaluation

## 2017-03-27 LAB — CBC
HCT: 28.4 % — ABNORMAL LOW (ref 36.0–46.0)
Hemoglobin: 9.3 g/dL — ABNORMAL LOW (ref 12.0–15.0)
MCH: 29 pg (ref 26.0–34.0)
MCHC: 32.7 g/dL (ref 30.0–36.0)
MCV: 88.5 fL (ref 78.0–100.0)
Platelets: 240 10*3/uL (ref 150–400)
RBC: 3.21 MIL/uL — ABNORMAL LOW (ref 3.87–5.11)
RDW: 18.3 % — ABNORMAL HIGH (ref 11.5–15.5)
WBC: 9.9 10*3/uL (ref 4.0–10.5)

## 2017-03-27 LAB — BIRTH TISSUE RECOVERY COLLECTION (PLACENTA DONATION)

## 2017-03-27 NOTE — Progress Notes (Signed)
SVD: repeat  S:  Pt reports feeling better. Had prob with BP yesterday and nausea/ Tolerating po/ Voiding without problems/ No n/v/ Bleeding is moderate/ Pain controlled withnarcotic analgesics including percocet    O:  A & O x 3  stable/ VS: Blood pressure (!) 91/45, pulse 68, temperature 98 F (36.7 C), temperature source Oral, resp. rate 16, height 5\' 3"  (1.6 m), weight 123.8 kg (273 lb), last menstrual period 06/20/2016, SpO2 96 %, unknown if currently breastfeeding.  LABS:  Results for orders placed or performed during the hospital encounter of 03/26/17 (from the past 24 hour(s))  CBC     Status: Abnormal   Collection Time: 03/27/17  5:27 AM  Result Value Ref Range   WBC 9.9 4.0 - 10.5 K/uL   RBC 3.21 (L) 3.87 - 5.11 MIL/uL   Hemoglobin 9.3 (L) 12.0 - 15.0 g/dL   HCT 16.128.4 (L) 09.636.0 - 04.546.0 %   MCV 88.5 78.0 - 100.0 fL   MCH 29.0 26.0 - 34.0 pg   MCHC 32.7 30.0 - 36.0 g/dL   RDW 40.918.3 (H) 81.111.5 - 91.415.5 %   Platelets 240 150 - 400 K/uL  Collect bld for placenta donatation     Status: None   Collection Time: 03/27/17  5:27 AM  Result Value Ref Range   Placenta donation bld collect COLLECTED BY LABORATORY     I&O: I/O last 3 completed shifts: In: 3880 [P.O.:480; I.V.:3400] Out: 1415 [Urine:1025; Other:50; Blood:340]   No intake/output data recorded.  Lungs: chest clear, no wheezing, rales, normal symmetric air entry  Heart: regular rate and rhythm and grade 2/6 SEM  Abdomen: obese soft. Uterus at umb firm. Prim dressing d/c/i  Perineum: is normal  Lochia: mod  Extremities:no redness or tenderness in the calves or thighs, edema 1++    A/P: POD # 1/PPD # 7/ [id number]/ [id number]  Doing well  Continue routine post partum orders  Ambulate Encourage increased fluid intake

## 2017-03-27 NOTE — Lactation Note (Signed)
This note was copied from a baby's chart. Lactation Consultation Note  Patient Name: [name] Delbene MWNUU'VToday's Date: 03/27/2017 Reason for consult: Initial assessment  Initial consult with Exp BF mom of 24 hour old infant. Infant with 12 BF for 15-90 minutes, 6 voids and 6 stools since birth. Infant weight 7 lb 12 oz. LATCH scores 8-9. Infant asleep in mom's arms.   Mom reports infant is eating ok. She reports she is not opening his mouth as well as he was. Infant is noted to have a recessed chin. Enc mom to perform chin tug after latch to assist with deepening latch. Mom is experiencing some tenderness with initial latch. Enc mom to apply EBM to nipples followed by coconut oil. Coconut oil given to mom. Offered to assist mom with latch when infant awakens for next feeding.   Enc mom to feed infant STS 8-12 xin 24 hours at first feeding cues. Enc mom to use pillow and head support with feeding. Mom has a BF pillow at the bedside. Mom reports she has difficulty with the football hold, enc her to call to get help with it. Enc mom to hand express colostrum before latch to get milk flowing and then after latch to apply to nipples.   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 Mom was given her Medela PIS Backpack at her request. BF Resources Handout and [facility] given, mom informed of IP/OP Services, BF Support Groups and LC phone #. Enc mom to call with any questions/concerns prn.   Maternal Data Formula Feeding for Exclusion: No Has patient been taught Hand Expression?: Yes Does the patient have breastfeeding experience prior to this delivery?: Yes  Feeding Feeding Type: Breast Fed Length of feed: 15 min  LATCH Score                   Interventions Interventions: Breast feeding basics reviewed;Support pillows;Position options;Skin to skin  Lactation Tools Discussed/Used WIC Program: No   Consult Status Consult Status: Follow-up Date: 03/28/17 Follow-up type: In-patient    Silas FloodSharon S  Hice 03/27/2017, 1:43 PM

## 2017-03-28 DIAGNOSIS — O9902 Anemia complicating childbirth: Secondary | ICD-10-CM | POA: Diagnosis present

## 2017-03-28 LAB — TYPE AND SCREEN
ABO/RH(D): A POS
Antibody Screen: POSITIVE
DAT, IGG: NEGATIVE
Donor AG Type: NEGATIVE
UNIT DIVISION: 0

## 2017-03-28 LAB — BPAM RBC
BLOOD PRODUCT EXPIRATION DATE: 201811022359
Unit Type and Rh: 6200

## 2017-03-28 MED ORDER — MAGNESIUM OXIDE 400 (241.3 MG) MG PO TABS
400.0000 mg | ORAL_TABLET | Freq: Every day | ORAL | Status: DC
  Administered 2017-03-28: 400 mg via ORAL
  Filled 2017-03-28 (×2): qty 1

## 2017-03-28 MED ORDER — POLYSACCHARIDE IRON COMPLEX 150 MG PO CAPS
150.0000 mg | ORAL_CAPSULE | Freq: Every day | ORAL | Status: DC
  Administered 2017-03-29: 150 mg via ORAL
  Filled 2017-03-28: qty 1

## 2017-03-28 NOTE — Lactation Note (Signed)
This note was copied from a baby's chart. Lactation Consultation Note  Patient Name: [name] RUEAV'WToday's Date: 03/28/2017 Reason for consult: Follow-up assessment Infant is 7451 hours old & seen by Lactation for follow-up assessment. Infant was asleep with mom when LC entered. RN had called LC stating mom has some questions. Mom reports that BF is going well on her left breast but that after baby comes off her right breast, it looks like a new tube of lipstick. Mom reports she has been doing the cradle hold and finds the football hold difficult to do by herself (mom reports she BF her 2nd child 'into toddler hood'). Mom stated he has a small mouth and wondered if that had anything to do with it and questioned whether he has a lip tie because he has a hard time flipping his upper lip out. Discussed different positions such as cross-cradle and laid-back. Also encouraged mom to unlatch & relatch if she has pain with BF and to apply EBM on her nipples after BF. Discussed chin tug and mom stated she has tried doing that.  Mom reports no other questions at this time. Left LC number on whiteboard & encouraged mom to call at a future BF so LC can help/ assess latch.   Maternal Data    Feeding Length of feed: 15 min  LATCH Score Latch: Grasps breast easily, tongue down, lips flanged, rhythmical sucking.  Audible Swallowing: A few with stimulation  Type of Nipple: Everted at rest and after stimulation  Comfort (Breast/Nipple): Soft / non-tender  Hold (Positioning): No assistance needed to correctly position infant at breast.  LATCH Score: 9  Interventions Interventions: Breast feeding basics reviewed;Position options;Expressed milk;Support pillows  Lactation Tools Discussed/Used     Consult Status Consult Status: Follow-up Date: 03/29/17 Follow-up type: In-patient    Oneal GroutLaura C Kolleen Ochsner 03/28/2017, 4:03 PM

## 2017-03-28 NOTE — Progress Notes (Signed)
CSW acknowledges consult for Edenberg score > 9. CSW met with MOB at bedside to assess.   Upon this writers arrival, MOB was warm and welcoming. This Probation officer inquired about hx of depression and/or anxiety. MOB notes she has a hx of both prior to pregnancy and is currently being treated with Lexapro. CSW provided education regarding Baby Blues vs PMADs and provided MOB with information about support groups held at [facility] encouraged MOB to evaluate her mental health throughout the postpartum period with the use of the New Mom Checklist developed by Postpartum Progress and notify a medical professional if symptoms arise. CSW identifies no further need for intervention at this time or barriers to discharge.  Tyiana Hill, MSW, LCSW-A Clinical Social Worker  [facility]  Office: [phone]

## 2017-03-28 NOTE — Lactation Note (Signed)
This note was copied from a baby's chart. Lactation Consultation Note  Patient Name: [name] UJWJX'BToday's Date: 03/28/2017 Reason for consult: Follow-up assessment [facility] is 6558 hours old [facility]. Mom was BF baby in cradle hold on left breast when LC entered. Mom had called LC earlier to assess latch. Mom stated he had been BF on her left breast for ~15 mins but that baby's latch became more shallow so mom unlatched to relatch. Mom was having a difficult time latching in the cradle hold so suggested mom try cross-cradle hold but when mom tried, she was having a difficult time with that as well but baby did latch & had a deep latch. Swallows were noted & mom reported no pain/ discomfort. After another ~5 mins, mom switched baby to her right breast. Mom has compression stripe on right nipple. Suggested mom try cross-cradle on this breast as well but mom has been pulling baby's chin down to get him to open wide & latch so she was not providing head support when he did open wide. After a few attempts, baby did latch and mom reported it felt comfortable; swallows were noted. Encouraged mom to continue working on positions and unlatching if he slips down to just the nipple or it feels uncomfortable. Mom has coconut oil in the room so encouraged mom to apply EBM or coconut oil after every BF to help with soreness/ healing. Mom encouraged to feed baby 8-12 times/24 hours and with feeding cues.  Mom reports no questions at this time. Encouraged mom to ask for help as needed.  Maternal Data    Feeding Feeding Type: Breast Fed Length of feed:  (still BF when LC left)  LATCH Score Latch: Repeated attempts needed to sustain latch, nipple held in mouth throughout feeding, stimulation needed to elicit sucking reflex.  Audible Swallowing: Spontaneous and intermittent  Type of Nipple: Everted at rest and after stimulation  Comfort (Breast/Nipple): Soft / non-tender  Hold  (Positioning): Assistance needed to correctly position infant at breast and maintain latch.  LATCH Score: 8  Interventions Interventions: Breast feeding basics reviewed;Assisted with latch;Breast compression;Adjust position;Support pillows;Position options;Expressed milk;Coconut oil  Lactation Tools Discussed/Used     Consult Status Consult Status: Follow-up Date: 03/29/17 Follow-up type: In-patient    Oneal GroutLaura C Chanc Kervin 03/28/2017, 11:11 PM

## 2017-03-28 NOTE — Progress Notes (Signed)
Subjective: POD# 2 Information for the patient's newborn:  [name], Boy [name] [[id number]][[id number]]  female    circ declined Baby name: [name]  Reports feeling well, some pain after ambulation and shower yesterday, requests abdominal binder Feeding: breast Patient reports tolerating PO.  Breast symptoms:none Pain controlled with PO meds Denies HA/SOB/C/P/N/V/dizziness. Flatus present, + BM. She reports vaginal bleeding as normal, without clots.  She is ambulating, urinating without difficulty.     Objective:   VS:    Vitals:   03/27/17 0523 03/27/17 0910 03/27/17 1744 03/28/17 0540  BP: (!) 91/45 (!) 94/41 (!) 117/49 140/64  Pulse: 68 68 71 61  Resp: 16 16 17 16   Temp: 98 F (36.7 C) 98.4 F (36.9 C) 98.3 F (36.8 C) 98 F (36.7 C)  TempSrc: Oral Oral Axillary Oral  SpO2: 96%     Weight:      Height:         Intake/Output Summary (Last 24 hours) at 03/28/17 1009 Last data filed at 03/27/17 1045  Gross per 24 hour  Intake                0 ml  Output              350 ml  Net             -350 ml        Recent Labs  03/25/17 1047 03/27/17 0527  WBC 8.6 9.9  HGB 10.8* 9.3*  HCT 33.6* 28.4*  PLT 267 240     Blood type: --/--/A POS (10/17 1039)  Rubella: Immune (03/08 0000)     Physical Exam:  General: alert, cooperative, no distress and morbidly obese Abdomen: + panus, edema, NT, soft, BS x4Q Incision: clean, dry and intact, pressure dressing removed and changed, minimal discharge (telfa and cloth tape, allergy to honeycomb adhesive) Uterine Fundus: firm, below umbilicus, nontender Lochia: minimal Ext: +1 pedal edema, no redness or tenderness in the calves or thighs      Assessment/Plan: 36 y.o.   POD# 2. [id number]                  Principal Problem:   Postpartum care following repeat cesarean delivery (10/18) Active Problems:   Anti-Duffy antibodies present   History of cesarean section -planned repeat   Congenital subvalvular aortic stenosis -repaired   Deaf - LEFT ear   Maternal anemia, with delivery   Doing well, stable.      Oral Fe and Mag ox         Advance diet as tolerated Encourage rest when baby rests Breastfeeding support Encourage to ambulate Routine post-op care Anticipate DC in AM  [name], CNM, MSN 03/28/2017, 10:09 AM

## 2017-03-29 MED ORDER — COCONUT OIL OIL: 1.0000 "application " | TOPICAL_OIL | 0 refills | Status: DC | PRN

## 2017-03-29 MED ORDER — ACETAMINOPHEN 325 MG PO TABS: 650.0000 mg | ORAL_TABLET | ORAL | Status: DC | PRN

## 2017-03-29 MED ORDER — IBUPROFEN 600 MG PO TABS: 600.0000 mg | ORAL_TABLET | Freq: Four times a day (QID) | ORAL | 0 refills | Status: DC

## 2017-03-29 MED ORDER — SENNOSIDES-DOCUSATE SODIUM 8.6-50 MG PO TABS: 2.0000 | ORAL_TABLET | ORAL | Status: DC

## 2017-03-29 MED ORDER — ESCITALOPRAM OXALATE 10 MG PO TABS: 10.0000 mg | ORAL_TABLET | Freq: Every day | ORAL | 2 refills | Status: DC

## 2017-03-29 MED ORDER — OXYCODONE-ACETAMINOPHEN 5-325 MG PO TABS: 1.0000 | ORAL_TABLET | ORAL | 0 refills | Status: DC | PRN

## 2017-03-29 MED ORDER — SIMETHICONE 80 MG PO CHEW: 80.0000 mg | CHEWABLE_TABLET | Freq: Three times a day (TID) | ORAL | 0 refills | Status: DC

## 2017-03-29 MED ORDER — POLYSACCHARIDE IRON COMPLEX 150 MG PO CAPS: 150.0000 mg | ORAL_CAPSULE | Freq: Every day | ORAL | Status: DC

## 2017-03-29 MED ORDER — MAGNESIUM OXIDE 400 (241.3 MG) MG PO TABS: 400.0000 mg | ORAL_TABLET | Freq: Every day | ORAL | Status: DC

## 2017-03-29 NOTE — Discharge Summary (Signed)
OB Discharge Summary     Patient Name: [name] DOB: Jan 12, 1981 MRN: [id number]  Date of admission: 03/26/2017 Delivering MD: Olivia MackieAAVON, RICHARD   Date of discharge: 03/29/2017  Admitting diagnosis: Previous Cesarean Section Intrauterine pregnancy: [id number]     Secondary diagnosis:  Principal Problem:   Postpartum care following repeat cesarean delivery (10/18) Active Problems:   Anti-Duffy antibodies present   History of cesarean section -planned repeat   Congenital subvalvular aortic stenosis -repaired   Deaf - LEFT ear   Maternal anemia, with delivery     Discharge diagnosis: Term Pregnancy Delivered and Anemia, hx anxiety previously on Lexapro and desires to resume.                                                                                            Post partum procedures:none  Complications: None  Hospital course:  Sceduled C/S   36 y.o. yo [id number] at 3955w6d was admitted to the hospital 03/26/2017 for scheduled cesarean section with the following indication:Elective Repeat.  Membrane Rupture Time/Date: 11:56 AM ,03/26/2017   Patient delivered a Viable infant.03/26/2017  Details of operation can be found in separate operative note.  Pateint had an uncomplicated postpartum course.  She is ambulating, tolerating a regular diet, passing flatus, and urinating well. Patient is discharged home in stable condition on  03/29/17         Physical exam  Vitals:   03/27/17 1744 03/28/17 0540 03/28/17 1821 03/29/17 0607  BP: (!) 117/49 140/64 (!) 120/56 (!) 119/57  Pulse: 71 61 64 68  Resp: 17 16 18 20   Temp: 98.3 F (36.8 C) 98 F (36.7 C) 97.8 F (36.6 C) 98.1 F (36.7 C)  TempSrc: Axillary Oral Oral Oral  SpO2:      Weight:      Height:       General: alert, cooperative and no distress Lochia: appropriate Uterine Fundus: firm Incision: Dressing is clean, dry, and intact DVT Evaluation: No cords or calf tenderness. Calf/Ankle edema is present Labs: Lab  Results  Component Value Date   WBC 9.9 03/27/2017   HGB 9.3 (L) 03/27/2017   HCT 28.4 (L) 03/27/2017   MCV 88.5 03/27/2017   PLT 240 03/27/2017   CMP Latest Ref Rng & Units 12/27/2016  Glucose 65 - 99 mg/dL 130(Q110(H)  BUN 6 - 20 mg/dL 12  Creatinine 6.570.44 - 8.461.00 mg/dL 9.620.54  Sodium 952135 - 841145 mmol/L 134(L)  Potassium 3.5 - 5.1 mmol/L 3.9  Chloride 101 - 111 mmol/L 104  CO2 22 - 32 mmol/L 22  Calcium 8.9 - 10.3 mg/dL 8.3(L)  Total Protein 6.5 - 8.1 g/dL 6.4(L)  Total Bilirubin 0.3 - 1.2 mg/dL 0.5  Alkaline Phos 38 - 126 U/L 87  AST 15 - 41 U/L 14(L)  ALT 14 - 54 U/L 9(L)    Discharge instruction: per After Visit Summary and "Baby and Me Booklet".  After visit meds:  Allergies as of 03/29/2017      Reactions   Codeine Hives      Medication List    TAKE these medications   acetaminophen 325 MG  tablet Commonly known as:  TYLENOL Take 2 tablets (650 mg total) by mouth every 4 (four) hours as needed (for pain scale < 4).   calcium carbonate 500 MG chewable tablet Commonly known as:  TUMS - dosed in mg elemental calcium Chew 2 tablets by mouth 2 (two) times daily as needed for heartburn. As needed   coconut oil Oil Apply 1 application topically as needed.   escitalopram 10 MG tablet Commonly known as:  LEXAPRO Take 1 tablet (10 mg total) by mouth daily.   ibuprofen 600 MG tablet Commonly known as:  ADVIL,MOTRIN Take 1 tablet (600 mg total) by mouth every 6 (six) hours.   iron polysaccharides 150 MG capsule Commonly known as:  NIFEREX Take 1 capsule (150 mg total) by mouth daily.   magnesium oxide 400 (241.3 Mg) MG tablet Commonly known as:  MAG-OX Take 1 tablet (400 mg total) by mouth daily.   oxyCODONE-acetaminophen 5-325 MG tablet Commonly known as:  PERCOCET/ROXICET Take 1 tablet by mouth every 4 (four) hours as needed (pain scale 4-7).   prenatal multivitamin Tabs tablet Take 1 tablet by mouth daily.   ranitidine 150 MG capsule Commonly known as:   ZANTAC Take 150 mg by mouth daily as needed for heartburn.   senna-docusate 8.6-50 MG tablet Commonly known as:  Senokot-S Take 2 tablets by mouth daily.   simethicone 80 MG chewable tablet Commonly known as:  MYLICON Chew 1 tablet (80 mg total) by mouth 3 (three) times daily after meals.       Diet: routine diet  Activity: Advance as tolerated. Pelvic rest for 6 weeks.   Outpatient follow up:6 weeks Postpartum contraception: Not Discussed  Newborn Data: Live born female [place] Birth Weight: 8 lb 3.9 oz (3740 g) APGAR: 9, 9  Newborn Delivery   Birth date/time:  03/26/2017 11:57:00 Delivery type:  C-Section, Low Transverse  C-section categorization:  Repeat     Baby Feeding: Breast Disposition:home with mother   03/29/2017 Neta Mends, CNM

## 2017-03-30 MED FILL — ESCITALOPRAM 10 MG TABLET: 10 | 30 days supply | Qty: 30 | Fill #0

## 2017-03-30 MED FILL — IBUPROFEN 600 MG TABLET: 600 | 7 days supply | Qty: 30 | Fill #0

## 2017-04-04 ENCOUNTER — Inpatient Hospital Stay (HOSPITAL_COMMUNITY)
Admission: AD | Admit: 2017-04-04 | Discharge: 2017-04-04 | Disposition: A | Discharge status: Home / Self Care | Payer: 59 | Source: Ambulatory Visit | Attending: Obstetrics and Gynecology | Admitting: Obstetrics and Gynecology

## 2017-04-04 ENCOUNTER — Inpatient Hospital Stay (HOSPITAL_BASED_OUTPATIENT_CLINIC_OR_DEPARTMENT_OTHER): Payer: 59

## 2017-04-04 ENCOUNTER — Encounter (HOSPITAL_COMMUNITY): Payer: Self-pay

## 2017-04-04 ENCOUNTER — Encounter (HOSPITAL_COMMUNITY): Payer: 59

## 2017-04-04 DIAGNOSIS — Z803 Family history of malignant neoplasm of breast: Secondary | ICD-10-CM | POA: Insufficient documentation

## 2017-04-04 DIAGNOSIS — M79605 Pain in left leg: Secondary | ICD-10-CM | POA: Insufficient documentation

## 2017-04-04 DIAGNOSIS — Z791 Long term (current) use of non-steroidal anti-inflammatories (NSAID): Secondary | ICD-10-CM | POA: Insufficient documentation

## 2017-04-04 DIAGNOSIS — Z818 Family history of other mental and behavioral disorders: Secondary | ICD-10-CM | POA: Diagnosis not present

## 2017-04-04 DIAGNOSIS — H9192 Unspecified hearing loss, left ear: Secondary | ICD-10-CM | POA: Insufficient documentation

## 2017-04-04 DIAGNOSIS — Z79891 Long term (current) use of opiate analgesic: Secondary | ICD-10-CM | POA: Insufficient documentation

## 2017-04-04 DIAGNOSIS — Z9889 Other specified postprocedural states: Secondary | ICD-10-CM | POA: Diagnosis not present

## 2017-04-04 DIAGNOSIS — Z8249 Family history of ischemic heart disease and other diseases of the circulatory system: Secondary | ICD-10-CM | POA: Diagnosis not present

## 2017-04-04 DIAGNOSIS — Q244 Congenital subaortic stenosis: Secondary | ICD-10-CM | POA: Insufficient documentation

## 2017-04-04 DIAGNOSIS — Z98891 History of uterine scar from previous surgery: Secondary | ICD-10-CM | POA: Diagnosis not present

## 2017-04-04 DIAGNOSIS — M7989 Other specified soft tissue disorders: Secondary | ICD-10-CM

## 2017-04-04 DIAGNOSIS — M79609 Pain in unspecified limb: Secondary | ICD-10-CM | POA: Diagnosis not present

## 2017-04-04 DIAGNOSIS — R51 Headache: Secondary | ICD-10-CM | POA: Insufficient documentation

## 2017-04-04 DIAGNOSIS — Z8379 Family history of other diseases of the digestive system: Secondary | ICD-10-CM | POA: Insufficient documentation

## 2017-04-04 DIAGNOSIS — Z885 Allergy status to narcotic agent status: Secondary | ICD-10-CM | POA: Insufficient documentation

## 2017-04-04 DIAGNOSIS — Z79899 Other long term (current) drug therapy: Secondary | ICD-10-CM | POA: Diagnosis not present

## 2017-04-04 DIAGNOSIS — O165 Unspecified maternal hypertension, complicating the puerperium: Secondary | ICD-10-CM | POA: Insufficient documentation

## 2017-04-04 DIAGNOSIS — I8001 Phlebitis and thrombophlebitis of superficial vessels of right lower extremity: Secondary | ICD-10-CM | POA: Insufficient documentation

## 2017-04-04 LAB — PROTEIN / CREATININE RATIO, URINE
Creatinine, Urine: 28 mg/dL
Total Protein, Urine: <6 mg/dL

## 2017-04-04 LAB — CBC
HCT: 32.1 % — ABNORMAL LOW (ref 36.0–46.0)
HEMOGLOBIN: 10.3 g/dL — AB (ref 12.0–15.0)
MCH: 28.5 pg (ref 26.0–34.0)
MCHC: 32.1 g/dL (ref 30.0–36.0)
MCV: 88.9 fL (ref 78.0–100.0)
Platelets: 261 10*3/uL (ref 150–400)
RBC: 3.61 MIL/uL — AB (ref 3.87–5.11)
RDW: 16.5 % — AB (ref 11.5–15.5)
WBC: 8.7 10*3/uL (ref 4.0–10.5)

## 2017-04-04 LAB — COMPREHENSIVE METABOLIC PANEL
ALBUMIN: 3.3 g/dL — AB (ref 3.5–5.0)
ALT: 18 U/L (ref 14–54)
ANION GAP: 6 (ref 5–15)
AST: 13 U/L — AB (ref 15–41)
Alkaline Phosphatase: 115 U/L (ref 38–126)
BUN: 15 mg/dL (ref 6–20)
CO2: 27 mmol/L (ref 22–32)
Calcium: 8.5 mg/dL — ABNORMAL LOW (ref 8.9–10.3)
Chloride: 107 mmol/L (ref 101–111)
Creatinine, Ser: 0.62 mg/dL (ref 0.44–1.00)
GFR calc Af Amer: >60 mL/min (ref >60)
GFR calc non Af Amer: >60 mL/min (ref >60)
GLUCOSE: 102 mg/dL — AB (ref 65–99)
POTASSIUM: 4.2 mmol/L (ref 3.5–5.1)
SODIUM: 140 mmol/L (ref 135–145)
Total Bilirubin: 0.5 mg/dL (ref 0.3–1.2)
Total Protein: 6.7 g/dL (ref 6.5–8.1)

## 2017-04-04 LAB — URINALYSIS, ROUTINE W REFLEX MICROSCOPIC
BILIRUBIN URINE: NEGATIVE
GLUCOSE, UA: NEGATIVE mg/dL
KETONES UR: NEGATIVE mg/dL
NITRITE: NEGATIVE
PH: 7 (ref 5.0–8.0)
Protein, ur: NEGATIVE mg/dL
Specific Gravity, Urine: 1.008 (ref 1.005–1.030)

## 2017-04-04 MED ORDER — BUTALBITAL-APAP-CAFFEINE 50-325-40 MG PO TABS: 2.0000 | ORAL_TABLET | Freq: Once | ORAL | Status: DC

## 2017-04-04 MED ORDER — IBUPROFEN 600 MG PO TABS
600.0000 mg | ORAL_TABLET | Freq: Once | ORAL | Status: AC
  Administered 2017-04-04: 600 mg via ORAL
  Filled 2017-04-04: qty 1

## 2017-04-04 NOTE — Discharge Instructions (Signed)
Postpartum Hypertension °Postpartum hypertension is high blood pressure after pregnancy that remains higher than normal for more than two days after delivery. You may not realize that you have postpartum hypertension if your blood pressure is not being checked regularly. In some cases, postpartum hypertension will go away on its own, usually within a week of delivery. However, for some women, medical treatment is required to prevent serious complications, such as seizures or stroke. °The following things can affect your blood pressure: °· The type of delivery you had. °· Having received IV fluids or other medicines during or after delivery. ° °What are the causes? °Postpartum hypertension may be caused by any of the following or by a combination of any of the following: °· Hypertension that existed before pregnancy (chronic hypertension). °· Gestational hypertension. °· Preeclampsia or eclampsia. °· Receiving a lot of fluid through an IV during or after delivery. °· Medicines. °· HELLP syndrome. °· Hyperthyroidism. °· Stroke. °· Other rare neurological or blood disorders. ° °In some cases, the cause may not be known. °What increases the risk? °Postpartum hypertension can be related to one or more risk factors, such as: °· Chronic hypertension. In some cases, this may not have been diagnosed before pregnancy. °· Obesity. °· Type 2 diabetes. °· Kidney disease. °· Family history of preeclampsia. °· Other medical conditions that cause hormonal imbalances. ° °What are the signs or symptoms? °As with all types of hypertension, postpartum hypertension may not have any symptoms. Depending on how high your blood pressure is, you may experience: °· Headaches. These may be mild, moderate, or severe. They may also be steady, constant, or sudden in onset (thunderclap headache). °· Visual changes. °· Dizziness. °· Shortness of breath. °· Swelling of your hands, feet, lower legs, or face. In some cases, you may have swelling in  more than one of these locations. °· Heart palpitations or a racing heartbeat. °· Difficulty breathing while lying down. °· Decreased urination. ° °Other rare signs and symptoms may include: °· Sweating more than usual. This lasts longer than a few days after delivery. °· Chest pain. °· Sudden dizziness when you get up from sitting or lying down. °· Seizures. °· Nausea or vomiting. °· Abdominal pain. ° °How is this diagnosed? °The diagnosis of postpartum hypertension is made through a combination of physical examination findings and testing of your blood and urine. You may also have additional tests, such as a CT scan or an MRI, to check for other complications of postpartum hypertension. °How is this treated? °When blood pressure is high enough to require treatment, your options may include: °· Medicines to reduce blood pressure (antihypertensives). Tell your health care provider if you are breastfeeding or if you plan to breastfeed. There are many antihypertensive medicines that are safe to take while breastfeeding. °· Stopping medicines that may be causing hypertension. °· Treating medical conditions that are causing hypertension. °· Treating the complications of hypertension, such as seizures, stroke, or kidney problems. ° °Your health care provider will also continue to monitor your blood pressure closely and repeatedly until it is within a safe range for you. °Follow these instructions at home: °· Take medicines only as directed by your health care provider. °· Get regular exercise after your health care provider tells you that it is safe. °· Follow your health care provider’s recommendations on fluid and salt restrictions. °· Do not use any tobacco products, including cigarettes, chewing tobacco, or electronic cigarettes. If you need help quitting, ask your health care provider. °·   Keep all follow-up visits as directed by your health care provider. This is important. Contact a health care provider  if:  Your symptoms get worse.  You have new symptoms, such as: ? Headache. ? Dizziness. ? Visual changes. Get help right away if:  You develop a severe or sudden headache.  You have seizures.  You develop numbness or weakness on one side of your body.  You have difficulty thinking, speaking, or swallowing.  You develop severe abdominal pain.  You develop difficulty breathing, chest pain, a racing heartbeat, or heart palpitations. These symptoms may represent a serious problem that is an emergency. Do not wait to see if the symptoms will go away. Get medical help right away. Call your local emergency services (911 in the [place] Do not drive yourself to the hospital. This information is not intended to replace advice given to you by your health care provider. Make sure you discuss any questions you have with your health care provider. Document Released: 01/27/2014 Document Revised: 10/29/2015 Document Reviewed: 12/08/2013 Elsevier Interactive Patient Education  2018 [employer]     Phlebitis Phlebitis is soreness and swelling (inflammation) of a vein. This can occur in your arms, legs, or torso (trunk), as well as deeper inside your body. Phlebitis is usually not serious when it occurs close to the surface of the body. However, it can cause serious problems when it occurs in a vein deeper inside the body. What are the causes? Phlebitis can be triggered by various things, including:  Reduced blood flow through your veins. This can happen with: ? Bed rest over a long period. ? Long-distance travel. ? Injury. ? Surgery. ? Being overweight (obese) or pregnant.  Having an IV tube put in the vein and getting certain medicines through the vein.  Cancer and cancer treatment.  Use of illegal drugs taken through the vein.  Inflammatory diseases.  Inherited (genetic) diseases that increase the risk of blood clots.  Hormone therapy, such as birth control pills.  What are the  signs or symptoms?  Red, tender, swollen, and painful area on your skin. Usually, the area will be long and narrow.  Firmness along the center of the affected area. This can indicate that a blood clot has formed.  Low-grade fever. How is this diagnosed? A health care provider can usually diagnose phlebitis by examining the affected area and asking about your symptoms. To check for infection or blood clots, your health care provider may order blood tests or an ultrasound exam of the area. Blood tests and your family history may also indicate if you have an underlying genetic disease that causes blood clots. Occasionally, a piece of tissue is taken from the body (biopsy sample) if an unusual cause of phlebitis is suspected. How is this treated? Treatment will vary depending on the severity of the condition and the area of the body affected. Treatment may include:  Use of a warm compress or heating pad.  Use of compression stockings or bandages.  Anti-inflammatory medicines.  Removal of any IV tube that may be causing the problem.  Medicines that kill germs (antibiotics) if an infection is present.  Blood-thinning medicines if a blood clot is suspected or present.  In rare cases, surgery may be needed to remove damaged sections of vein.  Follow these instructions at home:  Only take over-the-counter or prescription medicines as directed by your health care provider. Take all medicines exactly as prescribed.  Raise (elevate) the affected area above the level of  your heart as directed by your health care provider.  Apply a warm compress or heating pad to the affected area as directed by your health care provider. Do not sleep with the heating pad.  Use compression stockings or bandages as directed. These will speed healing and prevent the condition from coming back.  If you are on blood thinners: ? Get follow-up blood tests as directed by your health care provider. ? Check with your  health care provider before using any new medicines. ? Carry a medical alert card or wear your medical alert jewelry to show that you are on blood thinners.  For phlebitis in the legs: ? Avoid prolonged standing or bed rest. ? Keep your legs moving. Raise your legs when sitting or lying.  Do not smoke.  Women, particularly those over the age of 35, should consider the risks and benefits of taking the contraceptive pill. This kind of hormone treatment can increase your risk for blood clots.  Follow up with your health care provider as directed. Contact a health care provider if:  You have unusual bruising or any bleeding problems.  Your swelling or pain in the affected area is not improving.  You are on anti-inflammatory medicine, and you develop belly (abdominal) pain. Get help right away if:  You have a sudden onset of chest pain or difficulty breathing.  You have a fever or persistent symptoms for more than 2-3 days.  You have a fever and your symptoms suddenly get worse. This information is not intended to replace advice given to you by your health care provider. Make sure you discuss any questions you have with your health care provider. Document Released: 05/20/2001 Document Revised: 11/01/2015 Document Reviewed: 01/31/2013 Elsevier Interactive Patient Education  2017 [employer]

## 2017-04-04 NOTE — Progress Notes (Addendum)
Postpartum pt presents to triage for right side leg swollen that was noticed this morning upon waking up. States tightness felt. Reddened a top part of right leg. C/o ha as well.  Took 500 mg tylenol at about 0900  Sx: heart surgery in 1996  Provider at [facility] assessing pt. Measuring leg. Leg measures same on both side. Provider felt pedal pulses on both sides.   1305: [facility] notified to page oncall personnel for doppler study. Personnel returned paged. Notified lab for stat labs  1520: notified [facility] to page oncall doppler personnel. Oncall person paged back stating in the parking lot.   1555: doppler being done at [facility]. Dx with Thrombophlebitis  Pt states ha pain decreased from 6 to a 4.   Provider at [facility] reassessing and discussing pOC.   1705: Discharge instructions given with pt understanding. Pt left unit via ambulatory with SO.

## 2017-04-04 NOTE — MAU Provider Note (Signed)
History     CSN: [id number]  Arrival date and time: 04/04/17 1222   First Provider Initiated Contact with Patient 04/04/17 1240      Chief Complaint  Patient presents with  . Leg Pain    right side swollen   HPI [name] is a 36 y.o. [phone] [id number] female who presents 9 days s/p RCS for left leg pain & swelling. PMH significant for sub-aortic stenosis (repaired). Denies hx of hypertension, DVT, or PE.  Reports that she woke up this morning with swelling & pain in her right lower leg. Denies chest pain, SOB, fever, or cough. Patient is breastfeeding. States she falls asleep on the couch while breastfeeding without her legs elevated; stays in that position for hours at a time.  Endorses headache since yesterday that she attributes to being up all night with her newborn. Rates pain 6/10. Describes as frontal dull headache. Took 1 ES tylenol this morning without relief.  OB History    Gravida Para Term Preterm AB Living   4 3 3   1 3    SAB TAB Ectopic Multiple Live Births   1     0 3      Past Medical History:  Diagnosis Date  . Anemia   . Anti-Duffy antibodies present   . Deafness    LT ear since birth   . Deafness in left ear   . Fractured coccyx (HCC)   . Hx of varicella   . Subaortic stenosis    repaired    Past Surgical History:  Procedure Laterality Date  . CARDIAC SURGERY     open heart surgery   . CESAREAN SECTION    . CESAREAN SECTION N/A 07/28/2012   Procedure: CESAREAN SECTION;  Surgeon: Lenoard Adenichard J Taavon, MD;  Location: [facility];  Service: Obstetrics;  Laterality: N/A;  Repeat C/S  EDD: 07/20/12  . CESAREAN SECTION N/A 03/26/2017   Procedure: Repeat CESAREAN SECTION;  Surgeon: Olivia Mackieaavon, Richard, MD;  [facility];  Service: Obstetrics;  Laterality: N/A;  EDD: 03/27/17 Allergy: Codeine    Family History  Problem Relation Age of Onset  . Peripheral vascular disease Mother   . Varicose Veins Mother   . Anemia Mother   . Depression Mother   .  Anxiety disorder Mother   . GER disease Mother   . Cancer Maternal Grandmother        breast  . Heart attack Father     Social History  Substance Use Topics  . Smoking status: Never Smoker  . Smokeless tobacco: Never Used  . Alcohol use Yes     Comment: 1 q mth    Allergies:  Allergies  Allergen Reactions  . Codeine Hives    Prescriptions Prior to Admission  Medication Sig Dispense Refill Last Dose  . acetaminophen (TYLENOL) 325 MG tablet Take 2 tablets (650 mg total) by mouth every 4 (four) hours as needed (for pain scale < 4).     . calcium carbonate (TUMS - DOSED IN MG ELEMENTAL CALCIUM) 500 MG chewable tablet Chew 2 tablets by mouth 2 (two) times daily as needed for heartburn. As needed   Past Week at Unknown time  . coconut oil OIL Apply 1 application topically as needed.  0   . escitalopram (LEXAPRO) 10 MG tablet Take 1 tablet (10 mg total) by mouth daily. 30 tablet 2   . ibuprofen (ADVIL,MOTRIN) 600 MG tablet Take 1 tablet (600 mg total) by mouth every 6 (six) hours.  30 tablet 0   . iron polysaccharides (NIFEREX) 150 MG capsule Take 1 capsule (150 mg total) by mouth daily.     . magnesium oxide (MAG-OX) 400 (241.3 Mg) MG tablet Take 1 tablet (400 mg total) by mouth daily.     Marland Kitchen oxyCODONE-acetaminophen (PERCOCET/ROXICET) 5-325 MG tablet Take 1 tablet by mouth every 4 (four) hours as needed (pain scale 4-7). 30 tablet 0   . Prenatal Vit-Fe Fumarate-FA (PRENATAL MULTIVITAMIN) TABS Take 1 tablet by mouth daily.   Past Week at Unknown time  . ranitidine (ZANTAC) 150 MG capsule Take 150 mg by mouth daily as needed for heartburn.   Past Month at Unknown time  . senna-docusate (SENOKOT-S) 8.6-50 MG tablet Take 2 tablets by mouth daily.     . simethicone (MYLICON) 80 MG chewable tablet Chew 1 tablet (80 mg total) by mouth 3 (three) times daily after meals. 30 tablet 0     Review of Systems  Constitutional: Negative.   Eyes: Negative for photophobia and visual disturbance.   Respiratory: Negative for shortness of breath.   Cardiovascular: Positive for leg swelling. Negative for chest pain and palpitations.  Gastrointestinal: Negative.   Musculoskeletal: Negative.   Neurological: Positive for headaches. Negative for dizziness and light-headedness.   Physical Exam   Blood pressure (!) 139/59, pulse (!) 56, temperature 98.3 F (36.8 C), temperature source Oral, resp. rate 18, last menstrual period 06/20/2016, SpO2 98 %, unknown if currently breastfeeding. Patient Vitals for the past 24 hrs:  BP Temp Temp src Pulse Resp SpO2  04/04/17 1649 - - - - - 98 %  04/04/17 1646 (!) 139/59 - - (!) 56 - -  04/04/17 1644 - - - - - 96 %  04/04/17 1639 - - - - - 99 %  04/04/17 1634 - - - - - 98 %  04/04/17 1631 (!) 149/61 - - (!) 55 - -  04/04/17 1629 - - - - - 99 %  04/04/17 1624 - - - - - 99 %  04/04/17 1619 - - - - - 98 %  04/04/17 1616 (!) 142/60 - - (!) 55 - -  04/04/17 1614 - - - - - 98 %  04/04/17 1609 - - - - - 97 %  04/04/17 1604 - - - - - 98 %  04/04/17 1601 (!) 140/59 - - (!) 56 - -  04/04/17 1559 - - - - - 98 %  04/04/17 1554 - - - - - 96 %  04/04/17 1549 - - - - - 97 %  04/04/17 1546 (!) 141/61 - - (!) 55 - -  04/04/17 1544 - - - - - 97 %  04/04/17 1539 - - - - - 97 %  04/04/17 1534 - - - - - 98 %  04/04/17 1531 (!) 142/58 - - (!) 56 - -  04/04/17 1529 - - - - - 98 %  04/04/17 1524 - - - - - 97 %  04/04/17 1519 - - - - - 98 %  04/04/17 1516 (!) 152/61 - - (!) 56 - -  04/04/17 1514 - - - - - 97 %  04/04/17 1509 - - - - - 97 %  04/04/17 1402 (!) 136/54 - - (!) 58 - -  04/04/17 1347 (!) 155/63 - - (!) 57 - -  04/04/17 1315 (!) 144/56 - - (!) 57 - -  04/04/17 1300 136/61 - - (!) 57 - -  04/04/17 1245 (!) 146/61 - - Marland Kitchen)  59 - -  04/04/17 1240 (!) 142/51 98.3 F (36.8 C) Oral (!) 57 18 -    Physical Exam  Nursing note and vitals reviewed. Constitutional: She is oriented to person, place, and time. She appears well-developed and well-nourished. No  distress.  HENT:  Head: Normocephalic and atraumatic.  Eyes: Conjunctivae are normal. Right eye exhibits no discharge. Left eye exhibits no discharge. No scleral icterus.  Neck: Normal range of motion.  Cardiovascular: Normal rate, regular rhythm and normal heart sounds.   No murmur heard. Respiratory: Effort normal and breath sounds normal. No respiratory distress. She has no wheezes.  GI: Soft. There is no tenderness.  Musculoskeletal:       Right lower leg: She exhibits tenderness and edema.  BLE edema, 2+ pitting. Calf diameter equal size. Midway down shin area ~4 cm in diameter, erythematous, tender, & warm to the touch. No fluctuance or drainage.   Neurological: She is alert and oriented to person, place, and time.  Skin: Skin is warm and dry. She is not diaphoretic.  Psychiatric: She has a normal mood and affect. Her behavior is normal. Judgment and thought content normal.    MAU Course  Procedures Results for orders placed or performed during the hospital encounter of 04/04/17 (from the past 24 hour(s))  Protein / creatinine ratio, urine     Status: None   Collection Time: 04/04/17 12:56 PM  Result Value Ref Range   Creatinine, Urine 28.00 mg/dL   Total Protein, Urine <6 mg/dL   Protein Creatinine Ratio        0.00 - 0.15 mg/mg[Cre]  Urinalysis, Routine w reflex microscopic     Status: Abnormal   Collection Time: 04/04/17 12:56 PM  Result Value Ref Range   Color, Urine STRAW (A) YELLOW   APPearance CLEAR CLEAR   Specific Gravity, Urine 1.008 1.005 - 1.030   pH 7.0 5.0 - 8.0   Glucose, UA NEGATIVE NEGATIVE mg/dL   Hgb urine dipstick LARGE (A) NEGATIVE   Bilirubin Urine NEGATIVE NEGATIVE   Ketones, ur NEGATIVE NEGATIVE mg/dL   Protein, ur NEGATIVE NEGATIVE mg/dL   Nitrite NEGATIVE NEGATIVE   Leukocytes, UA MODERATE (A) NEGATIVE   RBC / HPF 6-30 0 - 5 RBC/hpf   WBC, UA 6-30 0 - 5 WBC/hpf   Bacteria, UA RARE (A) NONE SEEN   Squamous Epithelial / LPF 0-5 (A) NONE SEEN    Mucus PRESENT   CBC     Status: Abnormal   Collection Time: 04/04/17  1:15 PM  Result Value Ref Range   WBC 8.7 4.0 - 10.5 K/uL   RBC 3.61 (L) 3.87 - 5.11 MIL/uL   Hemoglobin 10.3 (L) 12.0 - 15.0 g/dL   HCT 16.1 (L) 09.6 - 04.5 %   MCV 88.9 78.0 - 100.0 fL   MCH 28.5 26.0 - 34.0 pg   MCHC 32.1 30.0 - 36.0 g/dL   RDW 40.9 (H) 81.1 - 91.4 %   Platelets 261 150 - 400 K/uL  Comprehensive metabolic panel     Status: Abnormal   Collection Time: 04/04/17  1:15 PM  Result Value Ref Range   Sodium 140 135 - 145 mmol/L   Potassium 4.2 3.5 - 5.1 mmol/L   Chloride 107 101 - 111 mmol/L   CO2 27 22 - 32 mmol/L   Glucose, Bld 102 (H) 65 - 99 mg/dL   BUN 15 6 - 20 mg/dL   Creatinine, Ser 7.82 0.44 - 1.00 mg/dL   Calcium 8.5 (  L) 8.9 - 10.3 mg/dL   Total Protein 6.7 6.5 - 8.1 g/dL   Albumin 3.3 (L) 3.5 - 5.0 g/dL   AST 13 (L) 15 - 41 U/L   ALT 18 14 - 54 U/L   Alkaline Phosphatase 115 38 - 126 U/L   Total Bilirubin 0.5 0.3 - 1.2 mg/dL   GFR calc non Af Amer >60 >60 mL/min   GFR calc Af Amer >60 >60 mL/min   Anion gap 6 5 - 15    MDM Elevated BPs, none severe range. Will continue to cycle & check labs.  LE venous doppler study ordered to assess for DVT --- prelim results show no DVT. Superficial thrombophlebitis on shin.  Discussed results with Dr. Billy Coast. Will discharge home with instructions for compression & elevation of LE. Patient to schedule f/u in office later next week for BP check.   Assessment and Plan  A: 1. Postpartum hypertension   2. History of cesarean section   3. Thrombophlebitis of superficial veins of right lower extremity    P: Discharge home PP preeclampsia precautions discussed; pt to schedule f/u BP check next week Warm compresses, LE elevation, & ibuprofen for leg -- discussed warning signs & reasons to return  Judeth Horn 04/04/2017, 12:41 PM

## 2017-04-04 NOTE — Progress Notes (Signed)
VASCULAR LAB PRELIMINARY  PRELIMINARY  PRELIMINARY  PRELIMINARY  Right lower extremity venous duplex completed.    Preliminary report:  There is no DVT noted in the right lower extremity.  There is thrombophlebitis noted throughout the right shin.  Varicosities noted throughout entire right lower extremity.    Vetra Shinall, RVT 04/04/2017, 4:09 PM

## 2017-04-04 NOTE — MAU Note (Signed)
Urine in lab 

## 2017-04-10 DIAGNOSIS — Z0130 Encounter for examination of blood pressure without abnormal findings: Secondary | ICD-10-CM | POA: Diagnosis not present

## 2017-04-10 DIAGNOSIS — R600 Localized edema: Secondary | ICD-10-CM | POA: Diagnosis not present

## 2017-04-17 DIAGNOSIS — Z0130 Encounter for examination of blood pressure without abnormal findings: Secondary | ICD-10-CM | POA: Diagnosis not present

## 2017-05-11 DIAGNOSIS — Z1151 Encounter for screening for human papillomavirus (HPV): Secondary | ICD-10-CM | POA: Diagnosis not present

## 2018-03-23 ENCOUNTER — Other Ambulatory Visit: Payer: Self-pay

## 2018-03-23 ENCOUNTER — Encounter: Payer: Self-pay | Admitting: Family Medicine

## 2018-03-23 ENCOUNTER — Ambulatory Visit (INDEPENDENT_AMBULATORY_CARE_PROVIDER_SITE_OTHER): Payer: No Typology Code available for payment source | Admitting: Family Medicine

## 2018-03-23 VITALS — BP 100/78 | HR 63 | Temp 97.7°F | Ht 62.0 in | Wt 254.2 lb

## 2018-03-23 DIAGNOSIS — Z8776 Personal history of (corrected) congenital malformations of integument, limbs and musculoskeletal system: Secondary | ICD-10-CM

## 2018-03-23 DIAGNOSIS — Z8659 Personal history of other mental and behavioral disorders: Secondary | ICD-10-CM

## 2018-03-23 DIAGNOSIS — N393 Stress incontinence (female) (male): Secondary | ICD-10-CM | POA: Diagnosis not present

## 2018-03-23 DIAGNOSIS — H9191 Unspecified hearing loss, right ear: Secondary | ICD-10-CM

## 2018-03-23 DIAGNOSIS — R3915 Urgency of urination: Secondary | ICD-10-CM

## 2018-03-23 DIAGNOSIS — Z8759 Personal history of other complications of pregnancy, childbirth and the puerperium: Secondary | ICD-10-CM | POA: Diagnosis not present

## 2018-03-23 DIAGNOSIS — Z87768 Personal history of other specified (corrected) congenital malformations of integument, limbs and musculoskeletal system: Secondary | ICD-10-CM

## 2018-03-23 HISTORY — DX: Personal history of other mental and behavioral disorders: Z86.59

## 2018-03-23 HISTORY — DX: Personal history of other complications of pregnancy, childbirth and the puerperium: Z87.59

## 2018-03-23 HISTORY — DX: Personal history of (corrected) congenital malformations of integument, limbs and musculoskeletal system: Z87.76

## 2018-03-23 HISTORY — DX: Personal history of other specified (corrected) congenital malformations of integument, limbs and musculoskeletal system: Z87.768

## 2018-03-23 LAB — POCT URINALYSIS DIPSTICK
BILIRUBIN UA: NEGATIVE
Blood, UA: NEGATIVE
GLUCOSE UA: NEGATIVE
Ketones, UA: NEGATIVE
LEUKOCYTES UA: NEGATIVE
NITRITE UA: NEGATIVE
Protein, UA: NEGATIVE
Spec Grav, UA: 1.015 (ref 1.010–1.025)
UROBILINOGEN UA: 0.2 U/dL
pH, UA: 6 (ref 5.0–8.0)

## 2018-03-23 NOTE — Patient Instructions (Signed)
Please return in 3 months or so for your annual complete physical; please come fasting.   It was a pleasure meeting you today! Thank you for choosing [place] to meet your healthcare needs! I truly look forward to working with you. If you have any questions or concerns, please send me a message via Mychart or call the office at [phone].  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  If you do not hear from [place] within the next 2 weeks, please let me know. It can take 1-2 weeks to get appointments set up with the specialists.   Kegel Exercises Kegel exercises help strengthen the muscles that support the rectum, vagina, small intestine, bladder, and uterus. Doing Kegel exercises can help:  Improve bladder and bowel control.  Improve sexual response.  Reduce problems and discomfort during pregnancy.  Kegel exercises involve squeezing your pelvic floor muscles, which are the same muscles you squeeze when you try to stop the flow of urine. The exercises can be done while sitting, standing, or lying down, but it is best to vary your position. Phase 1 exercises 1. Squeeze your pelvic floor muscles tight. You should feel a tight lift in your rectal area. If you are a female, you should also feel a tightness in your vaginal area. Keep your stomach, buttocks, and legs relaxed. 2. Hold the muscles tight for up to 10 seconds. 3. Relax your muscles. Repeat this exercise 50 times a day or as many times as told by your health care provider. Continue to do this exercise for at least 4-6 weeks or for as long as told by your health care provider. This information is not intended to replace advice given to you by your health care provider. Make sure you discuss any questions you have with your health care provider. Document Released: 05/12/2012 Document Revised: 01/19/2016 Document Reviewed: 04/15/2015 Elsevier Interactive Patient Education  2018 [employer]  Overactive Bladder, Adult Overactive bladder is a group of urinary symptoms. With overactive bladder, you may suddenly feel the need to pass urine (urinate) right away. After feeling this sudden urge, you might also leak urine if you cannot get to the bathroom fast enough (urinary incontinence). These symptoms might interfere with your daily work or social activities. Overactive bladder symptoms may also wake you up at night. Overactive bladder affects the nerve signals between your bladder and your brain. Your bladder may get the signal to empty before it is full. Very sensitive muscles can also make your bladder squeeze too soon. What are the causes? Many things can cause an overactive bladder. Possible causes include:  Urinary tract infection.  Infection of nearby tissues, such as the prostate.  Prostate enlargement.  Being pregnant with twins or more (multiples).  Surgery on the uterus or urethra.  Bladder stones, inflammation, or tumors.  Drinking too much caffeine or alcohol.  Certain medicines, especially those that you take to help your body get rid of extra fluid (diuretics) by increasing urine production.  Muscle or nerve weakness, especially from: ? A spinal cord injury. ? Stroke. ? Multiple sclerosis. ? Parkinson disease.  Diabetes. This can cause a high urine volume that fills the bladder so quickly that the normal urge to urinate is triggered very strongly.  Constipation. A buildup of too much stool can put pressure on your bladder.  What increases the risk? You may be at greater risk for overactive bladder if you:  Are an older adult.  Smoke.  Are going through menopause.  Have prostate problems.  Have a neurological disease, such as stroke, dementia, Parkinson disease, or multiple sclerosis (MS).  Eat or drink things that irritate the bladder. These include alcohol, spicy food, and caffeine.  Are overweight or obese.  What are the signs or  symptoms? The signs and symptoms of an overactive bladder include:  Sudden, strong urges to urinate.  Leaking urine.  Urinating eight or more times per day.  Waking up to urinate two or more times per night.  How is this diagnosed? Your health care provider may suspect overactive bladder based on your symptoms. The health care provider will do a physical exam and take your medical history. Blood or urine tests may also be done. For example, you might need to have a bladder function test to check how well you can hold your urine. You might also need to see a health care provider who specializes in the urinary tract (urologist). How is this treated? Treatment for overactive bladder depends on the cause of your condition and whether it is mild or severe. Certain treatments can be done in your health care provider's office or clinic. You can also make lifestyle changes at home. Options include: Behavioral Treatments  Biofeedback. A specialist uses sensors to help you become aware of your body's signals.  Keeping a daily log of when you need to urinate and what happens after the urge. This may help you manage your condition.  Bladder training. This helps you learn to control the urge to urinate by following a schedule that directs you to urinate at regular intervals (timed voiding). At first, you might have to wait a few minutes after feeling the urge. In time, you should be able to schedule bathroom visits an hour or more apart.  Kegel exercises. These are exercises to strengthen the pelvic floor muscles, which support the bladder. Toning these muscles can help you control urination, even if your bladder muscles are overactive. A specialist will teach you how to do these exercises correctly. They require daily practice.  Weight loss. If you are obese or overweight, losing weight might relieve your symptoms of overactive bladder. Talk to your health care provider about losing weight and whether  there is a specific program or method that would work best for you.  Diet change. This might help if constipation is making your overactive bladder worse. Your health care provider or a dietitian can explain ways to change what you eat to ease constipation. You might also need to consume less alcohol and caffeine or drink other fluids at different times of the day.  Stopping smoking.  Wearing pads to absorb leakage while you wait for other treatments to take effect. Physical Treatments  Electrical stimulation. Electrodes send gentle pulses of electricity to strengthen the nerves or muscles that help to control the bladder. Sometimes, the electrodes are placed outside of the body. In other cases, they might be placed inside the body (implanted). This treatment can take several months to have an effect.  Supportive devices. Women may need a plastic device that fits into the vagina and supports the bladder (pessary). Medicines Several medicines can help treat overactive bladder and are usually used along with other treatments. Some are injected into the muscles involved in urination. Others come in pill form. Your health care provider may prescribe:  Antispasmodics. These medicines block the signals that the nerves send to the bladder. This keeps the bladder from releasing urine at the wrong time.  Tricyclic antidepressants. These types of antidepressants also relax bladder muscles.  Surgery  You may have a device implanted to help manage the nerve signals that indicate when you need to urinate.  You may have surgery to implant electrodes for electrical stimulation.  Sometimes, very severe cases of overactive bladder require surgery to change the shape of the bladder. Follow these instructions at home:  Take medicines only as directed by your health care provider.  Use any implants or a pessary as directed by your health care provider.  Make any diet or lifestyle changes that are  recommended by your health care provider. These might include: ? Drinking less fluid or drinking at different times of the day. If you need to urinate often during the night, you may need to stop drinking fluids early in the evening. ? Cutting down on caffeine or alcohol. Both can make an overactive bladder worse. Caffeine is found in coffee, tea, and sodas. ? Doing Kegel exercises to strengthen muscles. ? Losing weight if you need to. ? Eating a healthy and balanced diet to prevent constipation.  Keep a journal or log to track how much and when you drink and also when you feel the need to urinate. This will help your health care provider to monitor your condition. Contact a health care provider if:  Your symptoms do not get better after treatment.  Your pain and discomfort are getting worse.  You have more frequent urges to urinate.  You have a fever. Get help right away if: You are not able to control your bladder at all. This information is not intended to replace advice given to you by your health care provider. Make sure you discuss any questions you have with your health care provider. Document Released: 03/22/2009 Document Revised: 11/01/2015 Document Reviewed: 10/19/2013 Elsevier Interactive Patient Education  [employer].

## 2018-03-23 NOTE — Progress Notes (Signed)
Subjective  CC:  Chief Complaint  Patient presents with  . Establish Care    TC from [place], sees Dr. Billy Coast for GYN care   . Urinary Urgency    Patient states she has to go quickly and sometimes cannot hold urine, after pregnancy   . Hearing Loss    Patient states she has been deaf in her left ear, and now right ear is going, wants to discuss hearing aid     HPI: [name] is a 37 y.o. female who presents to [facility] at [facility] today to establish care with me as a new patient.   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  She has continued of care from her GYN.  Overall she does very well.  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  She is due for a physical at her convenience  She is concerned because she is having mercenary urinary urgency symptoms.  She reports sensation of urgency with inability to hold her urine at times.  She does have mild stress incontinence at times.  She denies irritative symptoms.  No gross hematuria.  She feels like he empties her bladder normally.  No back pain or other lower extremity neurologic deficits noted.  Hearing loss on the right: Having trouble hearing her family members.  Difficult hearing people talking while she is in a restaurant with loud background noise.  No trauma, no ear pain.  She is breast-feeding  Assessment  1. Urinary urgency   2. Personal history of congenital hip dysplasia   3. SUI (stress urinary incontinence, female)   4. History of postpartum depression   5. Breast feeding status of mother   6. Hearing loss of right ear, unspecified hearing loss type      Plan   Urinary urgency and stress incontinence: Unable to use medications due to breast-feeding  status.  Recommend referral to urology for complete work-up and then possible biofeedback or other treatment options to be determined depending on her results.  Patient agrees with this care plan.  Referral placed  Hearing loss with abnormal screen today.  Refer for audiology exam.  Will refer to ENT if needed.  Return for complete physical at her convenience  Follow up: Patient declines flu shot today. Orders Placed This Encounter  Procedures  . Ambulatory referral to Audiology  . Ambulatory referral to Urology  . POCT urinalysis dipstick   No orders of the defined types were placed in this encounter.    Depression screen PHQ 2/9 03/23/2018  Decreased Interest 0  Down, Depressed, Hopeless 0  PHQ - 2 Score 0    We updated and reviewed the patient's past history in detail and it is documented below.  Patient Active Problem List   Diagnosis Date Noted  . Personal history of congenital hip dysplasia 03/23/2018    Treated with Pavloc harness   . History of postpartum depression 03/23/2018  . 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 Congenital subvalvular aortic stenosis -repaired 03/26/2017  . Deaf - LEFT ear 03/26/2017  . Aortic valve insufficiency 11/25/2016    Overview:  Mild   . 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 Family history of Klippel-Feil syndrome 10/03/2016   Health Maintenance  Topic Date Due  . INFLUENZA VACCINE  03/23/2019 (Originally 01/07/2018)  . PAP SMEAR  06/10/2019  . TETANUS/TDAP  12/08/2026  . HIV Screening  Completed   Immunization History  Administered Date(s) Administered  . Influenza Split 07/30/2012   No outpatient medications have been marked as taking for the 03/23/18 encounter (Office Visit) with Willow Ora, MD.    Allergies: Patient is allergic to codeine. Past Medical History Patient  has a past medical history of Anemia, Anti-Duffy antibodies present, Deafness, Deafness in left ear, Depression, ELECTROCARDIOGRAM, ABNORMAL  (12/28/2008), Fractured coccyx (HCC), GERD (gastroesophageal reflux disease), Heart murmur, History of postpartum depression (03/23/2018), varicella, Hypertension, Personal history of congenital hip dysplasia (03/23/2018), and Subaortic stenosis. Past Surgical History Patient  has a past surgical history that includes Cardiac surgery; Cesarean section; Cesarean section (N/A, 07/28/2012); Cesarean section (N/A, 03/26/2017); and Eye surgery. Family History: Patient family history includes ADD / ADHD in her son; Anemia in her mother; Anxiety disorder in her mother and son; Cancer in her maternal grandmother; Depression in her mother; GER disease in her mother; Heart attack in her father; ODD in her son; Peripheral vascular disease in her mother; Varicose Veins in her mother. Social History:  Patient  reports that she has never smoked. She has never used smokeless tobacco. She reports that she drinks alcohol. She reports that she does not use drugs.  Review of Systems: Constitutional: negative for fever or malaise Ophthalmic: negative for photophobia, double vision or loss of vision Cardiovascular: negative for chest pain, dyspnea on exertion, or new LE swelling Respiratory: negative for SOB or persistent cough Gastrointestinal: negative for abdominal pain, change in bowel habits or melena Genitourinary: negative for dysuria or gross hematuria Musculoskeletal: negative for new gait disturbance or muscular weakness Integumentary: negative for new or persistent rashes Neurological: negative for TIA or stroke symptoms Psychiatric: negative for SI or delusions Allergic/Immunologic: negative for hives  Patient Care Team    Relationship Specialty Notifications Start End  Willow Ora, MD PCP - General Family Medicine  03/23/18   Olivia Mackie, MD Consulting Physician Obstetrics and Gynecology  03/23/18     Objective  Vitals: BP 100/78   Pulse 63   Temp 97.7 F (36.5 C)   Ht 5\' 2"  (1.575 m)    Wt 254 lb 3.2 oz (115.3 kg)   LMP 03/03/2018   SpO2 98%   BMI 46.49 kg/m  General:  Well developed, well nourished, no acute distress  Psych:  Alert and oriented,normal mood and affect HEENT:  Normocephalic, atraumatic, non-icteric sclera, PERRL, oropharynx is without mass or exudate, supple neck without adenopathy, mass or thyromegaly, right TM is normal-appearing Cardiovascular:  RRR 2/6 systolic murmur, nondisplaced PMI Respiratory:  Good breath sounds bilaterally, CTAB with normal respiratory effort Gastrointestinal: normal bowel sounds, soft, non-tender, no noted masses. No HSM MSK: no deformities, contusions. Joints are without erythema or swelling Skin:  Warm, no rashes or suspicious lesions noted Neurologic:    Mental status is normal. Gross motor and sensory exams are normal. Normal gait  Office Visit on 03/23/2018  Component Date Value Ref Range Status  . Color, UA 03/23/2018 yellow   Final  . Clarity, UA 03/23/2018 clear   Final  . Glucose, UA 03/23/2018 Negative  Negative Final  . Bilirubin, UA 03/23/2018 neg   Final  . Ketones, UA 03/23/2018 neg   Final  . Spec Grav, UA 03/23/2018 1.015  1.010 - 1.025 Final  . Blood, UA 03/23/2018 neg   Final  . pH, UA 03/23/2018 6.0  5.0 -  8.0 Final  . Protein, UA 03/23/2018 Negative  Negative Final  . Urobilinogen, UA 03/23/2018 0.2  0.2 or 1.0 E.U./dL Final  . Nitrite, UA 40/98/1191 neg   Final  . Leukocytes, UA 03/23/2018 Negative  Negative Final     Commons side effects, risks, benefits, and alternatives for medications and treatment plan prescribed today were discussed, and the patient expressed understanding of the given instructions. Patient is instructed to call or message via MyChart if he/she has any questions or concerns regarding our treatment plan. No barriers to understanding were identified. We discussed Red Flag symptoms and signs in detail. Patient expressed understanding regarding what to do in case of urgent or  emergency type symptoms.   Medication list was reconciled, printed and provided to the patient in AVS. Patient instructions and summary information was reviewed with the patient as documented in the AVS. This note was prepared with assistance of Dragon voice recognition software. Occasional wrong-word or sound-a-like substitutions may have occurred due to the inherent limitations of voice recognition software

## 2018-03-31 ENCOUNTER — Encounter: Payer: Self-pay | Admitting: Emergency Medicine

## 2018-06-23 ENCOUNTER — Ambulatory Visit: Payer: No Typology Code available for payment source | Attending: Family Medicine | Admitting: Audiology

## 2018-06-23 ENCOUNTER — Other Ambulatory Visit: Payer: Self-pay

## 2018-06-23 ENCOUNTER — Ambulatory Visit (INDEPENDENT_AMBULATORY_CARE_PROVIDER_SITE_OTHER): Payer: No Typology Code available for payment source | Admitting: Family Medicine

## 2018-06-23 ENCOUNTER — Encounter: Payer: Self-pay | Admitting: Family Medicine

## 2018-06-23 VITALS — BP 106/64 | HR 77 | Temp 98.3°F | Resp 16 | Ht 62.0 in | Wt 264.2 lb

## 2018-06-23 DIAGNOSIS — H90A11 Conductive hearing loss, unilateral, right ear with restricted hearing on the contralateral side: Secondary | ICD-10-CM | POA: Diagnosis present

## 2018-06-23 DIAGNOSIS — Z8776 Personal history of (corrected) congenital malformations of integument, limbs and musculoskeletal system: Secondary | ICD-10-CM | POA: Diagnosis not present

## 2018-06-23 DIAGNOSIS — H9192 Unspecified hearing loss, left ear: Secondary | ICD-10-CM | POA: Insufficient documentation

## 2018-06-23 DIAGNOSIS — M545 Low back pain, unspecified: Secondary | ICD-10-CM

## 2018-06-23 DIAGNOSIS — Z0000 Encounter for general adult medical examination without abnormal findings: Secondary | ICD-10-CM

## 2018-06-23 DIAGNOSIS — Q761 Klippel-Feil syndrome: Secondary | ICD-10-CM | POA: Diagnosis not present

## 2018-06-23 DIAGNOSIS — H93299 Other abnormal auditory perceptions, unspecified ear: Secondary | ICD-10-CM | POA: Insufficient documentation

## 2018-06-23 DIAGNOSIS — F331 Major depressive disorder, recurrent, moderate: Secondary | ICD-10-CM

## 2018-06-23 DIAGNOSIS — Z87768 Personal history of other specified (corrected) congenital malformations of integument, limbs and musculoskeletal system: Secondary | ICD-10-CM

## 2018-06-23 LAB — CBC WITH DIFFERENTIAL/PLATELET
BASOS ABS: 0 10*3/uL (ref 0.0–0.1)
Basophils Relative: 0.5 % (ref 0.0–3.0)
EOS ABS: 0.1 10*3/uL (ref 0.0–0.7)
EOS PCT: 1.3 % (ref 0.0–5.0)
HCT: 35.1 % — ABNORMAL LOW (ref 36.0–46.0)
HEMOGLOBIN: 11.5 g/dL — AB (ref 12.0–15.0)
LYMPHS ABS: 2.3 10*3/uL (ref 0.7–4.0)
Lymphocytes Relative: 28.5 % (ref 12.0–46.0)
MCHC: 32.8 g/dL (ref 30.0–36.0)
MCV: 85.4 fl (ref 78.0–100.0)
MONO ABS: 0.3 10*3/uL (ref 0.1–1.0)
Monocytes Relative: 4.1 % (ref 3.0–12.0)
NEUTROS PCT: 65.6 % (ref 43.0–77.0)
Neutro Abs: 5.2 10*3/uL (ref 1.4–7.7)
Platelets: 280 10*3/uL (ref 150.0–400.0)
RBC: 4.11 Mil/uL (ref 3.87–5.11)
RDW: 14.9 % (ref 11.5–15.5)
WBC: 7.9 10*3/uL (ref 4.0–10.5)

## 2018-06-23 LAB — COMPREHENSIVE METABOLIC PANEL
ALBUMIN: 3.8 g/dL (ref 3.5–5.2)
ALK PHOS: 73 U/L (ref 39–117)
ALT: 15 U/L (ref 0–35)
AST: 11 U/L (ref 0–37)
BILIRUBIN TOTAL: 0.4 mg/dL (ref 0.2–1.2)
BUN: 14 mg/dL (ref 6–23)
CO2: 27 mEq/L (ref 19–32)
CREATININE: 0.69 mg/dL (ref 0.40–1.20)
Calcium: 8.4 mg/dL (ref 8.4–10.5)
Chloride: 106 mEq/L (ref 96–112)
GFR: 102.28 mL/min (ref >60.00)
GLUCOSE: 96 mg/dL (ref 70–99)
POTASSIUM: 4 meq/L (ref 3.5–5.1)
SODIUM: 140 meq/L (ref 135–145)
TOTAL PROTEIN: 6.5 g/dL (ref 6.0–8.3)

## 2018-06-23 LAB — LIPID PANEL
CHOLESTEROL: 138 mg/dL (ref 0–200)
HDL: 42.7 mg/dL (ref >39.00)
LDL Cholesterol: 79 mg/dL (ref 0–99)
NonHDL: 95.11
Total CHOL/HDL Ratio: 3
Triglycerides: 81 mg/dL (ref 0.0–149.0)
VLDL: 16.2 mg/dL (ref 0.0–40.0)

## 2018-06-23 MED ORDER — FLUOXETINE HCL 20 MG PO TABS: ORAL_TABLET | ORAL | 2 refills | Status: DC

## 2018-06-23 MED FILL — FLUoxetine HCL 20 MG TABS: 20 | 28 days supply | Qty: 25 | Fill #0

## 2018-06-23 NOTE — Patient Instructions (Signed)
Please return in 6 weeks to recheck your mood.  We are starting an SSRI mood medication today.  Depression/anxiety Medications:  Taking the medicine as directed and not missing any doses is one of the best things you can do to treat your depression.  Here are some things to keep in mind:  1) Side effects (stomach upset, some increased anxiety) may happen before you notice a benefit.  These side effects typically go away over time. 2) Changes to your dose of medicine or a change in medication all together is sometimes necessary 3) Most people need to be on medication at least 6-12 months 4) Many people will notice an improvement within two weeks but the full effect of the medication can take up to 4-6 weeks 5) Stopping the medication when you start feeling better often results in a return of symptoms 6) If you start having thoughts of hurting yourself or others after starting this medicine, please call the office immediately at [phone].    If you have any questions or concerns, please don't hesitate to send me a message via MyChart or call the office at [phone]. Thank you for visiting with us today! It's our pleasure caring for you.  Please do these things to maintain good health!   Exercise at least 30-45 minutes a day,  4-5 days a week.   Eat a low-fat diet with lots of fruits and vegetables, up to 7-9 servings per day.  Drink plenty of water daily. Try to drink 8 8oz glasses per day.  Seatbelts can save your life. Always wear your seatbelt.  Place Smoke Detectors on every level of your home and check batteries every year.  Schedule an appointment with an eye doctor for an eye exam every 1-2 years  Safe sex - use condoms to protect yourself from STDs if you could be exposed to these types of infections. Use birth control if you do not want to become pregnant and are sexually active.  Avoid heavy alcohol use. If you drink, keep it to less than 2 drinks/day and not every  day.  Health Care Power of Attorney.  Choose someone you trust that could speak for you if you became unable to speak for yourself.  Depression is common in our stressful world.If you're feeling down or losing interest in things you normally enjoy, please come in for a visit.  If anyone is threatening or hurting you, please get help. Physical or Emotional Violence is never OK.   Take advil or tylenol for 7-10 days regularly to calm down your back pain.   Back Exercises The following exercises strengthen the muscles that help to support the back. They also help to keep the lower back flexible. Doing these exercises can help to prevent back pain or lessen existing pain. If you have back pain or discomfort, try doing these exercises 2-3 times each day or as told by your health care provider. When the pain goes away, do them once each day, but increase the number of times that you repeat the steps for each exercise (do more repetitions). If you do not have back pain or discomfort, do these exercises once each day or as told by your health care provider. Exercises Single Knee to Chest Repeat these steps 3-5 times for each leg: 1. Lie on your back on a firm bed or the floor with your legs extended. 2. Bring one knee to your chest. Your other leg should stay extended and in contact with the  floor. 3. Hold your knee in place by grabbing your knee or thigh. 4. Pull on your knee until you feel a gentle stretch in your lower back. 5. Hold the stretch for 10-30 seconds. 6. Slowly release and straighten your leg. Pelvic Tilt Repeat these steps 5-10 times: 1. Lie on your back on a firm bed or the floor with your legs extended. 2. Bend your knees so they are pointing toward the ceiling and your feet are flat on the floor. 3. Tighten your lower abdominal muscles to press your lower back against the floor. This motion will tilt your pelvis so your tailbone points up toward the ceiling instead of pointing to  your feet or the floor. 4. With gentle tension and even breathing, hold this position for 5-10 seconds. Cat-Cow Repeat these steps until your lower back becomes more flexible: 1. Get into a hands-and-knees position on a firm surface. Keep your hands under your shoulders, and keep your knees under your hips. You may place padding under your knees for comfort. 2. Let your head hang down, and point your tailbone toward the floor so your lower back becomes rounded like the back of a cat. 3. Hold this position for 5 seconds. 4. Slowly lift your head and point your tailbone up toward the ceiling so your back forms a sagging arch like the back of a cow. 5. Hold this position for 5 seconds.  Press-Ups Repeat these steps 5-10 times: 1. Lie on your abdomen (face-down) on the floor. 2. Place your palms near your head, about shoulder-width apart. 3. While you keep your back as relaxed as possible and keep your hips on the floor, slowly straighten your arms to raise the top half of your body and lift your shoulders. Do not use your back muscles to raise your upper torso. You may adjust the placement of your hands to make yourself more comfortable. 4. Hold this position for 5 seconds while you keep your back relaxed. 5. Slowly return to lying flat on the floor.  Bridges Repeat these steps 10 times: 1. Lie on your back on a firm surface. 2. Bend your knees so they are pointing toward the ceiling and your feet are flat on the floor. 3. Tighten your buttocks muscles and lift your buttocks off of the floor until your waist is at almost the same height as your knees. You should feel the muscles working in your buttocks and the back of your thighs. If you do not feel these muscles, slide your feet 1-2 inches farther away from your buttocks. 4. Hold this position for 3-5 seconds. 5. Slowly lower your hips to the starting position, and allow your buttocks muscles to relax completely. If this exercise is too easy,  try doing it with your arms crossed over your chest. Abdominal Crunches Repeat these steps 5-10 times: 1. Lie on your back on a firm bed or the floor with your legs extended. 2. Bend your knees so they are pointing toward the ceiling and your feet are flat on the floor. 3. Cross your arms over your chest. 4. Tip your chin slightly toward your chest without bending your neck. 5. Tighten your abdominal muscles and slowly raise your trunk (torso) high enough to lift your shoulder blades a tiny bit off of the floor. Avoid raising your torso higher than that, because it can put too much stress on your low back and it does not help to strengthen your abdominal muscles. 6. Slowly return to your  starting position. Back Lifts Repeat these steps 5-10 times: 1. Lie on your abdomen (face-down) with your arms at your sides, and rest your forehead on the floor. 2. Tighten the muscles in your legs and your buttocks. 3. Slowly lift your chest off of the floor while you keep your hips pressed to the floor. Keep the back of your head in line with the curve in your back. Your eyes should be looking at the floor. 4. Hold this position for 3-5 seconds. 5. Slowly return to your starting position. Contact a health care provider if:  Your back pain or discomfort gets much worse when you do an exercise.  Your back pain or discomfort does not lessen within 2 hours after you exercise. If you have any of these problems, stop doing these exercises right away. Do not do them again unless your health care provider says that you can. Get help right away if:  You develop sudden, severe back pain. If this happens, stop doing the exercises right away. Do not do them again unless your health care provider says that you can. This information is not intended to replace advice given to you by your health care provider. Make sure you discuss any questions you have with your health care provider. Document Released: 07/03/2004  Document Revised: 09/29/2017 Document Reviewed: 07/20/2014 Elsevier Interactive Patient Education  [employer].

## 2018-06-23 NOTE — Procedures (Signed)
Outpatient Audiology and [facility]  [address]  [place], [place] [address]  [phone]   Audiological Evaluation  Patient Name: [name]  Status: Outpatient   DOB: [birth year]    Diagnosis: Hearing Loss right ear                Deaf left ear MRN: [id number] Date:  06/23/2018     Referent: Willow Ora, MD  History: [name] was seen for an audiological evaluation.  Primary Concern: Born deaf in left ear with no "ear canal, middle or inner ear". Of concern is that her good right ear has started loosing hearing over the two years." "this year hearing is to the point that family and friends are getting frustrated because of her hearing". "I don't know what people are saying to me in social settings (I.e. restaurants). Pain: None  History of ear infections:  Y- as a child, with the right tympanic membrane "ruptured twice". The most recent was in my "early twenties".  History of ear surgery or "tubes" : Y - "more than once". I 'had temporary and permanent tubes".  History of dizziness/vertigo:   Y - in the past but not currently. May get "once every other year". History of balance issues:  Y - "always have been clumsy, but no worse than usual".  Tinnitus: Y - "occasionally-once a month for a few moment" - this has become more noticeable over the past two years. Sound sensitivity: N History of occupational noise exposure: N History of hypertension: N History of diabetes:  N Family history of hearing loss: "A third cousin became completely deaf from a childhood illness".     Evaluation: Conventional pure tone audiometry from 250Hz  - 8000Hz  with using insert earphones.  Hearing Thresholds: Right ear:  Thresholds of 45 DB HL at 250 Hz; 40 DB HL at 500 Hz; 15-20 DB HL from 1000 to 3000 Hz; 25 DB HL at 4000 Hz and 20 DB HL from 6000 to 8000 Hz.  The hearing loss is conductive with bone-conduction unmasked hearing thresholds of 15 DB HL from 250 to 500  Hz and -5-5 DB HL from 1000 8000 Hz.  Left ear:    Not tested- "deaf from birth " Reliability is good Speech reception levels (repeating words near threshold) using recorded spondee word lists:  Right ear: 20 dBHL.  Left ear: Did not test Word recognition (at comfortably loud volumes) using recorded NU-6 word lists in quiet.  Right ear: 100 % at 60 DB HL.  Left ear:   Did not test. Word recognition in minimal background noise:  +5 dBHL  Right ear: 48%                              Left ear: Did not test.  Tympanometry on the right side shows a very large volume of 6.8 - a tympanic membrane perforation must be ruled out.   CONCLUSION:      [name] a significant conductive hearing loss on the right side, her good ear, which requires in a referral to an ear, nose and throat physician.  [name] states that she saw Dr. Dorma Russell ENT in the past.  If the hearing loss is not correctable, [name] may benefit from a hearing aid.    [name] has a mild to moderate low-frequency hearing loss that improves to a slight hearing loss in the high frequencies.  Word recognition is  excellent at loud conversational speech levels however in minimal background noise her word recognition drops to poor on the right side.  It is expected that [name] will miss 50% of what is said and most social settings, possibly more with fluctuating background noise.  Please note that the left ear was not tested today because she has been deaf in that ear since birth with structural abnormalities reported.  The test results were discussed and [name] counseled.   RECOMMENDATIONS: 1.   Please refer to an ear nose and throat physician, Dr. Dorma Russell ENT if possible because [name]  has seen him in the past.  2.   Monitor hearing closely to rule out a progressive hearing loss and monitor hearing with a repeat audiological evaluation in 3-6 months (earlier if there is any change in hearing or ear pressure).   3.    Strategies that help improve hearing include: A) Face the speaker directly. Optimal is having the speakers face well - lit.  Unless amplified, being within 3-6 feet of the speaker will enhance word recognition. B) Avoid having the speaker back-lit as this will minimize the ability to use cues from lip-reading, facial expression and gestures. C)  Word recognition is poorer in background noise. For optimal word recognition, turn off the TV, radio or noisy fan when engaging in conversation. In a restaurant, try to sit away from noise sources and close to the primary speaker.  D)  Ask for topic clarification from time to time in order to remain in the conversation.  Most people don't mind repeating or clarifying a point when asked.  If needed, explain the difficulty hearing in background noise or hearing loss. 4.  Continue using hearing protection during noisy activities. Musician's plugs, are available from [employer] for music related hearing protection because there is no distortion.  Other hearing protection, such as sponge plugs (available at pharmacies) or earmuffs (available at sporting goods stores or department stores such as [employer]) are useful for noisy activities and venues.  Reyne Falconi L. Kate Sable, Au.D., CCC-A Doctor of Audiology 06/23/2018   cc: Willow Ora, MD \

## 2018-06-23 NOTE — Progress Notes (Signed)
Subjective  Chief Complaint  Patient presents with  . Annual Exam  . Anxiety    Was previously taking 10mg  Lexapro with improvement. Feels as though symptoms are returning    HPI: ADUT AEGERTER is a 38 y.o. female who presents to [facility] today for a Female Wellness Visit.  She also has the concerns and/or needs as listed above in the chief complaint. These will be addressed in addition to the Health Maintenance Visit.   Wellness Visit: annual visit with health maintenance review and exam without Pap   HM: dur for fasting lab work. Pap is up to date.   Diet and exercise are only fair. Busy with 3 small children. Husband is working full time and going to school.  Chronic disease management visit and/or acute problem visit:  Anxiety/depression; she reports a history of postpartum depression that was effectively treated with Lexapro up until about 1 to 2 years ago.  She did have the side effect of drowsiness with Lexapro and would prefer not to restart it, although she definitely thinks it helped with her mood.  Currently she is feeling apathetic, unmotivated, social anxiety, nervous about going out with kids, becoming a "recluse".  Struggling being a single parent because her husband is gone and busy all the time.  She is tired.  She denies suicidality.  Her husband is aware and is supportive.  She is weaning her youngest from breast-feeding, rarely breast-feeding at this time.  Also complains of low back pain.  Worse over the last several months.  It is intermittent.  Described as a dull ache.  No radicular symptoms.  She does have 3 small children and lifts them a lot.  Little exercise.  She is overweight.  Depression screen [facility] 2/9 06/23/2018 03/23/2018  Decreased Interest 1 0  Down, Depressed, Hopeless 1 0  PHQ - 2 Score 2 0  Altered sleeping 1 -  Tired, decreased energy 2 -  Change in appetite 2 -  Feeling bad or failure about yourself  2 -    Trouble concentrating 1 -  Moving slowly or fidgety/restless 2 -  Suicidal thoughts 0 -  PHQ-9 Score 12 -  Difficult doing work/chores Somewhat difficult -   GAD 7 : Generalized Anxiety Score 06/23/2018  Nervous, Anxious, on Edge 2  Control/stop worrying 2  Worry too much - different things 2  Trouble relaxing 2  Restless 0  Easily annoyed or irritable 3  Afraid - awful might happen 1  Total GAD 7 Score 12  Anxiety Difficulty Very difficult      Assessment  1. Annual physical exam   2. Personal history of congenital hip dysplasia   3. Klippel-Feil syndrome   4. Acute midline low back pain without sciatica   5. Moderate recurrent major depression [facility])      Plan  Female Wellness Visit:  Age appropriate Health Maintenance and Prevention measures were discussed with patient. Included topics are cancer screening recommendations, ways to keep healthy (see AVS) including dietary and exercise recommendations, regular eye and dental care, use of seat belts, and avoidance of moderate alcohol use and tobacco use.   BMI: discussed patient's BMI and encouraged positive lifestyle modifications to help get to or maintain a target BMI.  HM needs and immunizations were addressed and ordered. See below for orders. See HM and immunization section for updates.  Routine labs and screening tests ordered including cmp, cbc and lipids where appropriate.  Discussed recommendations  regarding Vit D and calcium supplementation (see AVS)  Chronic disease f/u and/or acute problem visit: (deemed necessary to be done in addition to the wellness visit):  Recurrent moderate major depression episode with secondary anxiety: In part triggered by social stressors.  Counseling done.  Start Prozac.  Discussed appropriate expectations, risks and benefits.  Close follow-up.  Low back pain: Start Tylenol and Advil and stretching exercises.  Musculoskeletal in nature.  Follow up: Return in about 6 weeks (around  08/04/2018) for mood follow up.   Orders Placed This Encounter  Procedures  . CBC with Differential/Platelet  . Comprehensive metabolic panel  . Lipid panel  . HIV Antibody (routine testing w rflx)   Meds ordered this encounter  Medications  . FLUoxetine (PROZAC) 20 MG tablet    Sig: Take 0.5 tablets (10 mg total) by mouth daily for 7 days, THEN 1 tablet (20 mg total) daily for 21 days.    Dispense:  30 tablet    Refill:  2      Lifestyle: Body mass index is 48.32 kg/m. Wt Readings from Last 3 Encounters:  06/23/18 264 lb 3.2 oz (119.8 kg)  03/23/18 254 lb 3.2 oz (115.3 kg)  03/26/17 273 lb (123.8 kg)     Patient Active Problem List   Diagnosis Date Noted  . Personal history of congenital hip dysplasia 03/23/2018    Treated with Pavloc harness   . History of postpartum depression 03/23/2018  . 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 Congenital subvalvular aortic stenosis -repaired 03/26/2017  . Deaf - LEFT ear 03/26/2017  . Aortic valve insufficiency 11/25/2016    Overview:  Mild   . 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 Family history of Klippel-Feil syndrome 10/03/2016   Health Maintenance  Topic Date Due  . INFLUENZA VACCINE  03/23/2019 (Originally 01/07/2018)  . PAP SMEAR-Modifier  06/10/2019  . TETANUS/TDAP  12/08/2026  . HIV Screening  Completed   Immunization History  Administered Date(s) Administered  . Influenza Split 07/30/2012   We updated and reviewed the patient's past history in detail and it is documented below. Allergies: Patient  reports current alcohol use. Past Medical History Patient  has a past medical history of Anemia, Anti-Duffy antibodies present, Deafness, Deafness in left ear, Depression, ELECTROCARDIOGRAM, ABNORMAL (12/28/2008), Fractured coccyx (HCC), GERD (gastroesophageal reflux disease), Heart murmur, History of postpartum depression (03/23/2018), varicella, Hypertension, Personal history of congenital hip dysplasia  (03/23/2018), and Subaortic stenosis. Past Surgical History Patient  has a past surgical history that includes Cardiac surgery; Cesarean section; Cesarean section (N/A, 07/28/2012); Cesarean section (N/A, 03/26/2017); and Eye surgery. Social History   Socioeconomic History  . Marital status: Married    Spouse name: jeff  . Number of children: 3  . Years of education: Not on file  . Highest education level: Not on file  Occupational History  . Not on file  Social Needs  . Financial resource strain: Not on file  . Food insecurity:    Worry: Not on file    Inability: Not on file  . Transportation needs:    Medical: Not on file    Non-medical: Not on file  Tobacco Use  . Smoking status: Never Smoker  . Smokeless tobacco: Never Used  Substance and Sexual Activity  . Alcohol use: Yes    Comment: 1 q mth  . Drug use: No  . Sexual activity: Yes    Birth control/protection: Condom  Lifestyle  . Physical activity:    Days per week: Not on  file    Minutes per session: Not on file  . Stress: Not on file  Relationships  . Social connections:    Talks on phone: Not on file    Gets together: Not on file    Attends religious service: Not on file    Active member of club or organization: Not on file    Attends meetings of clubs or organizations: Not on file    Relationship status: Not on file  Other Topics Concern  . Not on file  Social History Narrative  . Not on file   Family History  Problem Relation Age of Onset  . Peripheral vascular disease Mother   . Varicose Veins Mother   . Anemia Mother   . Depression Mother   . Anxiety disorder Mother   . GER disease Mother   . Cancer Maternal Grandmother        breast  . Heart attack Father   . Anxiety disorder Son   . ODD Son   . ADD / ADHD Son     Review of Systems: Constitutional: negative for fever or malaise Ophthalmic: negative for photophobia, double vision or loss of vision Cardiovascular: negative for chest pain,  dyspnea on exertion, or new LE swelling Respiratory: negative for SOB or persistent cough Gastrointestinal: negative for abdominal pain, change in bowel habits or melena Genitourinary: negative for dysuria or gross hematuria, no abnormal uterine bleeding or disharge Musculoskeletal: negative for new gait disturbance or muscular weakness Integumentary: negative for new or persistent rashes, no breast lumps Neurological: negative for TIA or stroke symptoms Psychiatric: negative for SI or delusions Allergic/Immunologic: negative for hives  Patient Care Team    Relationship Specialty Notifications Start End  Willow OraAndy, Oluwatomisin Deman L, MD PCP - General Family Medicine  03/23/18   Olivia Mackieaavon, Richard, MD Consulting Physician Obstetrics and Gynecology  03/23/18   Alfredo MartinezMacDiarmid, Scott, MD Consulting Physician Urology  06/23/18     Objective  Vitals: BP 106/64   Pulse 77   Temp 98.3 F (36.8 C) (Oral)   Resp 16   Ht 5\' 2"  (1.575 m)   Wt 264 lb 3.2 oz (119.8 kg)   LMP 06/21/2018   SpO2 97%   Breastfeeding Yes   BMI 48.32 kg/m  General:  Well developed, well nourished, no acute distress  Psych:  Alert and orientedx3, flat mood and affect HEENT:  Normocephalic, atraumatic, non-icteric sclera, PERRL, oropharynx is clear without mass or exudate, supple neck without adenopathy, mass or thyromegaly Cardiovascular:  Normal S1, S2, RRR without gallop, rub or murmur, nondisplaced PMI Respiratory:  Good breath sounds bilaterally, CTAB with normal respiratory effort Gastrointestinal: normal bowel sounds, soft, non-tender, no noted masses. No HSM MSK: no deformities, contusions. Joints are without erythema or swelling. Spine and CVA region are nontender Skin:  Warm, no rashes or suspicious lesions noted Neurologic:    Mental status is normal. CN 2-11 are normal. Gross motor and sensory exams are normal. Normal gait. No tremor Breast Exam: No mass, skin retraction or nipple discharge is appreciated in either breast.  No axillary adenopathy. Fibrocystic changes are not noted Neck exam: Normal    Commons side effects, risks, benefits, and alternatives for medications and treatment plan prescribed today were discussed, and the patient expressed understanding of the given instructions. Patient is instructed to call or message via MyChart if he/she has any questions or concerns regarding our treatment plan. No barriers to understanding were identified. We discussed Red Flag symptoms and signs in detail. Patient  expressed understanding regarding what to do in case of urgent or emergency type symptoms.   Medication list was reconciled, printed and provided to the patient in AVS. Patient instructions and summary information was reviewed with the patient as documented in the AVS. This note was prepared with assistance of Dragon voice recognition software. Occasional wrong-word or sound-a-like substitutions may have occurred due to the inherent limitations of voice recognition software

## 2018-06-24 ENCOUNTER — Other Ambulatory Visit: Payer: Self-pay | Admitting: *Deleted

## 2018-06-24 DIAGNOSIS — H9192 Unspecified hearing loss, left ear: Secondary | ICD-10-CM

## 2018-06-24 DIAGNOSIS — H9191 Unspecified hearing loss, right ear: Secondary | ICD-10-CM

## 2018-06-24 LAB — HIV ANTIBODY (ROUTINE TESTING W REFLEX): HIV 1&2 Ab, 4th Generation: NONREACTIVE

## 2018-06-24 NOTE — Progress Notes (Signed)
Please order an ENT referral to Dr. Dorma Russell, has seen in the past for hearing loss of right ear; chronic deafness in left. Thanks

## 2018-06-24 NOTE — Progress Notes (Signed)
Referral placed.

## 2018-07-22 MED FILL — FLUoxetine HCL 20 MG TABS: 20 | 30 days supply | Qty: 30 | Fill #1

## 2018-08-05 ENCOUNTER — Ambulatory Visit: Payer: No Typology Code available for payment source | Admitting: Family Medicine

## 2018-08-09 ENCOUNTER — Ambulatory Visit (INDEPENDENT_AMBULATORY_CARE_PROVIDER_SITE_OTHER): Payer: No Typology Code available for payment source | Admitting: Family Medicine

## 2018-08-09 ENCOUNTER — Encounter: Payer: Self-pay | Admitting: Family Medicine

## 2018-08-09 ENCOUNTER — Other Ambulatory Visit: Payer: Self-pay

## 2018-08-09 VITALS — BP 114/76 | HR 81 | Temp 98.1°F | Resp 16 | Ht 62.0 in | Wt 265.0 lb

## 2018-08-09 DIAGNOSIS — Z01810 Encounter for preprocedural cardiovascular examination: Secondary | ICD-10-CM

## 2018-08-09 DIAGNOSIS — I351 Nonrheumatic aortic (valve) insufficiency: Secondary | ICD-10-CM

## 2018-08-09 DIAGNOSIS — F331 Major depressive disorder, recurrent, moderate: Secondary | ICD-10-CM | POA: Diagnosis not present

## 2018-08-09 NOTE — Patient Instructions (Addendum)
Please return in 3 months for mood recheck.   If you have any questions or concerns, please don't hesitate to send me a message via MyChart or call the office at [phone]. Thank you for visiting with [place] today! It's our pleasure caring for you.  We will call you with information regarding your referral appointment. Dr. Fabio Bering office for cardiology clearance.  If you do not hear from [place] within the next 2 weeks, please let me know. It can take 1-2 weeks to get appointments set up with the specialists.

## 2018-08-09 NOTE — Progress Notes (Signed)
Subjective  CC:  Chief Complaint  Patient presents with  . Depression    Thinks she needs an increase in medication, some symptoms have improved    HPI: [name] is a 38 y.o. female who presents to the office today to address the problems listed above in the chief complaint, mood problems.  [name] is here after 6 weeks being on Prozac.  Subjectively, she feels she is 30 to 40% improved meaning her anxiety is much better and her depressive symptoms have improved as well.  However she still complains of fatigue, feeling overwhelmed and low motivation.  She has not had any side effects from the Prozac.  She is taking it consistently.  However her follow-up PHQ 9, as listed below, is the same or mildly worse than in January.  To review, she has a busy lifestyle with being the primary caretaker of 3 small children.  Her husband works full-time and goes to school full-time.  She cannot be clear whether her symptoms are from low mood or more from busy lifestyle.  She thinks this combination.  She is having less anxiety for sure.  She does report that the Lexapro controls her symptoms in the past.  She also brings up concerns of ADD.  She reports she is very disorganized.  She is never been diagnosed with ADD.  She has trouble completing tasks.  However she always did well in school and was a Engineer, site.  She did well at her job. Depression screen [facility] 2/9 08/09/2018 06/23/2018 03/23/2018  Decreased Interest 1 1 0  Down, Depressed, Hopeless 2 1 0  PHQ - 2 Score 3 2 0  Altered sleeping 3 1 -  Tired, decreased energy 3 2 -  Change in appetite 1 2 -  Feeling bad or failure about yourself  1 2 -  Trouble concentrating 1 1 -  Moving slowly or fidgety/restless 1 2 -  Suicidal thoughts 0 0 -  PHQ-9 Score 13 12 -  Difficult doing work/chores Very difficult Somewhat difficult -    Assessment  1. Moderate recurrent major depression (HCC)   2. Nonrheumatic aortic valve insufficiency   3.  Encounter for pre-operative cardiovascular clearance      Plan   Depression with anxiety: Improving subjectively..  Continue Prozac and recheck again in 6 weeks.  Consider increasing dose of Prozac and/or adding Wellbutrin at that time.  Could also change back to Lexapro if not feeling significantly better.  Once mood is controlled, will refer for ADD evaluation if symptoms of ADD persist.  However, not clearly meeting criteria at this point.  Also refer back to cardiology for preoperative clearance.  Needs ENT surgery: Her ENT is retiring in a few months, thus trying to get things done urgently and quickly..  Reviewed concept of mood problems caused by biochemical imbalance of neurotransmitters and rationale for treatment with medications and therapy.   Counseling given: pt was instructed to contact office, on-call physician or crisis Hotline if symptoms worsen significantly. If patient develops any suicidal or homicidal thoughts, she is directed to the ER immediately.   Follow up: Return in about 6 weeks (around 09/20/2018) for mood follow up.  Orders Placed This Encounter  Procedures  . Ambulatory referral to Cardiology   No orders of the defined types were placed in this encounter.     I reviewed the patients updated PMH, FH, and SocHx.    Patient Active Problem List   Diagnosis Date Noted  .  Moderate recurrent major depression (HCC) 08/09/2018  . Personal history of congenital hip dysplasia 03/23/2018  . History of postpartum depression 03/23/2018  . 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 Congenital subvalvular aortic stenosis -repaired 03/26/2017  . Deaf - LEFT ear 03/26/2017  . Aortic valve insufficiency 11/25/2016  . 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 Family history of Klippel-Feil syndrome 10/03/2016   Current Meds  Medication Sig  . acetaminophen (TYLENOL) 500 MG tablet Take 500 mg by mouth every 4 (four) hours as needed for mild pain or moderate pain.  Marland Kitchen  FLUoxetine (PROZAC) 20 MG tablet     Allergies: Patient is allergic to codeine. Family history:  Patient family history includes ADD / ADHD in her son; Anemia in her mother; Anxiety disorder in her mother and son; Cancer in her maternal grandmother; Depression in her mother; GER disease in her mother; Heart attack in her father; ODD in her son; Peripheral vascular disease in her mother; Varicose Veins in her mother. Social History   Socioeconomic History  . Marital status: Married    Spouse name: jeff  . Number of children: 3  . Years of education: Not on file  . Highest education level: Not on file  Occupational History  . Not on file  Social Needs  . Financial resource strain: Not on file  . Food insecurity:    Worry: Not on file    Inability: Not on file  . Transportation needs:    Medical: Not on file    Non-medical: Not on file  Tobacco Use  . Smoking status: Never Smoker  . Smokeless tobacco: Never Used  Substance and Sexual Activity  . Alcohol use: Yes    Comment: 1 q mth  . Drug use: No  . Sexual activity: Yes    Birth control/protection: Condom  Lifestyle  . Physical activity:    Days per week: Not on file    Minutes per session: Not on file  . Stress: Not on file  Relationships  . Social connections:    Talks on phone: Not on file    Gets together: Not on file    Attends religious service: Not on file    Active member of club or organization: Not on file    Attends meetings of clubs or organizations: Not on file    Relationship status: Not on file  Other Topics Concern  . Not on file  Social History Narrative  . Not on file     Review of Systems: Constitutional: Negative for fever malaise or anorexia Cardiovascular: negative for chest pain Respiratory: negative for SOB or persistent cough Gastrointestinal: negative for abdominal pain  Objective  Vitals: BP 114/76   Pulse 81   Temp 98.1 F (36.7 C) (Oral)   Resp 16   Ht  (1.575 m)   Wt  265 lb (120.2 kg)   LMP 07/25/2018   SpO2 98%   BMI 48.47 kg/m  General: no acute distress, well appearing, no apparent distress, well groomed Psych:  Alert and oriented x 3, appears well.  Mood is normal    Commons side effects, risks, benefits, and alternatives for medications and treatment plan prescribed today were discussed, and the patient expressed understanding of the given instructions. Patient is instructed to call or message via MyChart if he/she has any questions or concerns regarding our treatment plan. No barriers to understanding were identified. We discussed Red Flag symptoms and signs in detail. Patient expressed understanding regarding what to do in  case of urgent or emergency type symptoms.   Medication list was reconciled, printed and provided to the patient in AVS. Patient instructions and summary information was reviewed with the patient as documented in the AVS. This note was prepared with assistance of Dragon voice recognition software. Occasional wrong-word or sound-a-like substitutions may have occurred due to the inherent limitations of voice recognition software

## 2018-08-16 ENCOUNTER — Ambulatory Visit (INDEPENDENT_AMBULATORY_CARE_PROVIDER_SITE_OTHER): Payer: No Typology Code available for payment source | Admitting: Cardiology

## 2018-08-16 ENCOUNTER — Encounter: Payer: Self-pay | Admitting: Cardiology

## 2018-08-16 VITALS — BP 126/82 | HR 66 | Ht 62.0 in | Wt 262.0 lb

## 2018-08-16 DIAGNOSIS — H9192 Unspecified hearing loss, left ear: Secondary | ICD-10-CM

## 2018-08-16 DIAGNOSIS — Z01810 Encounter for preprocedural cardiovascular examination: Secondary | ICD-10-CM

## 2018-08-16 DIAGNOSIS — Q244 Congenital subaortic stenosis: Secondary | ICD-10-CM

## 2018-08-16 NOTE — Patient Instructions (Addendum)
Medication Instructions:  Your physician recommends that you continue on your current medications as directed. Please refer to the Current Medication list given to you today.   If you need a refill on your cardiac medications before your next appointment, please call your pharmacy.   Lab work: NONE If you have labs (blood work) drawn today and your tests are completely normal, you will receive your results only by: Marland Kitchen MyChart Message (if you have MyChart) OR . A paper copy in the mail If you have any lab test that is abnormal or we need to change your treatment, we will call you to review the results.  Testing/Procedures: An EKG was performed today  Your physician has requested that you have an echocardiogram. Echocardiography is a painless test that uses sound waves to create images of your heart. It provides your doctor with information about the size and shape of your heart and how well your heart's chambers and valves are working. This procedure takes approximately one hour. There are no restrictions for this procedure.    Follow-Up: At [facility], you and your health needs are our priority.  As part of our continuing mission to provide you with exceptional heart care, we have created designated Provider Care Teams.  These Care Teams include your primary Cardiologist (physician) and Advanced Practice Providers (APPs -  Physician Assistants and Nurse Practitioners) who all work together to provide you with the care you need, when you need it. You will need a follow up appointment in 6 months.    Any Other Special Instructions Will Be Listed Below   Echocardiogram An echocardiogram is a procedure that uses painless sound waves (ultrasound) to produce an image of the heart. Images from an echocardiogram can provide important information about:  Signs of coronary artery disease (CAD).  Aneurysm detection. An aneurysm is a weak or damaged part of an artery wall that bulges out from  the normal force of blood pumping through the body.  Heart size and shape. Changes in the size or shape of the heart can be associated with certain conditions, including heart failure, aneurysm, and CAD.  Heart muscle function.  Heart valve function.  Signs of a past heart attack.  Fluid buildup around the heart.  Thickening of the heart muscle.  A tumor or infectious growth around the heart valves. Tell a health care provider about:  Any allergies you have.  All medicines you are taking, including vitamins, herbs, eye drops, creams, and over-the-counter medicines.  Any blood disorders you have.  Any surgeries you have had.  Any medical conditions you have.  Whether you are pregnant or may be pregnant. What are the risks? Generally, this is a safe procedure. However, problems may occur, including:  Allergic reaction to dye (contrast) that may be used during the procedure. What happens before the procedure? No specific preparation is needed. You may eat and drink normally. What happens during the procedure?   An IV tube may be inserted into one of your veins.  You may receive contrast through this tube. A contrast is an injection that improves the quality of the pictures from your heart.  A gel will be applied to your chest.  A wand-like tool (transducer) will be moved over your chest. The gel will help to transmit the sound waves from the transducer.  The sound waves will harmlessly bounce off of your heart to allow the heart images to be captured in real-time motion. The images will be recorded on  a computer. The procedure may vary among health care providers and hospitals. What happens after the procedure?  You may return to your normal, everyday life, including diet, activities, and medicines, unless your health care provider tells you not to do that. Summary  An echocardiogram is a procedure that uses painless sound waves (ultrasound) to produce an image of the  heart.  Images from an echocardiogram can provide important information about the size and shape of your heart, heart muscle function, heart valve function, and fluid buildup around your heart.  You do not need to do anything to prepare before this procedure. You may eat and drink normally.  After the echocardiogram is completed, you may return to your normal, everyday life, unless your health care provider tells you not to do that. This information is not intended to replace advice given to you by your health care provider. Make sure you discuss any questions you have with your health care provider. Document Released: 05/23/2000 Document Revised: 06/28/2016 Document Reviewed: 06/28/2016 Elsevier Interactive Patient Education  2019 [employer].

## 2018-08-16 NOTE — Progress Notes (Signed)
Cardiology Office Note:    Date:  08/16/2018   ID:  [name], DOB 21-May-1981, MRN [id number]  PCP:  Willow Ora, MD  Cardiologist:  Garwin Brothers, MD   Referring MD: Willow Ora, MD    ASSESSMENT:    1. Pre-operative cardiovascular examination   2. Congenital subvalvular aortic stenosis -repaired   3. Deaf - LEFT ear    PLAN:    In order of problems listed above:  1. Secondary prevention stressed with the patient.  Importance of compliance with diet and medication stressed and she vocalized understanding.  Her blood pressure is stable.  Diet was discussed for obesity and risks of obesity explained. 2. Echocardiogram will be done to assess the valvular anatomy for the aortic valve as she is post surgery and history of congenital heart disease. 3. Her effort tolerance is good and in view of this she is not at high risk for coronary events during the aforementioned surgery.  Meticulous hemodynamic monitoring will further reduce the risk of coronary events.  I would await results of echocardiogram before I make my final recommendations for the surgery. 4. Patient will be seen in follow-up appointment in 6 months or earlier if the patient has any concerns    Medication Adjustments/Labs and Tests Ordered: Current medicines are reviewed at length with the patient today.  Concerns regarding medicines are outlined above.  No orders of the defined types were placed in this encounter.  No orders of the defined types were placed in this encounter.    History of Present Illness:    [name] is a 38 y.o. female who is being seen today for the evaluation of congenital heart disease.  Patient is post subaortic valve resection in the remote past and is now here for preop cardiovascular assessment at the request of Willow Ora, MD.  Patient is a pleasant 38 year old female.  She has history of Klippel Feil syndrome.  She denies any problems at this time and  takes care of activities of daily living.  No chest pain orthopnea or PND.  She walks some on a regular basis without any symptoms.  She mentions to me that she can walk 30 minutes without any issues.  At the time of my evaluation, the patient is alert awake oriented and in no distress.  She is morbidly obese.  Past Medical History:  Diagnosis Date  . Anemia   . Anti-Duffy antibodies present   . Deafness    LT ear since birth   . Deafness in left ear   . Depression   . ELECTROCARDIOGRAM, ABNORMAL 12/28/2008   Qualifier: Diagnosis of  By: [name], MD, Harrington Challenger   . Fractured coccyx (HCC)   . GERD (gastroesophageal reflux disease)   . Heart murmur   . History of postpartum depression 03/23/2018  . Hx of varicella   . Hypertension   . Personal history of congenital hip dysplasia 03/23/2018   Treated with [place] harness  . Subaortic stenosis    repaired    Past Surgical History:  Procedure Laterality Date  . CARDIAC SURGERY     open heart surgery   . CESAREAN SECTION    . CESAREAN SECTION N/A 07/28/2012   Procedure: CESAREAN SECTION;  Surgeon: Lenoard Aden, MD;  Location: [facility];  Service: Obstetrics;  Laterality: N/A;  Repeat C/S  EDD: 07/20/12  . CESAREAN SECTION N/A 03/26/2017   Procedure: Repeat CESAREAN SECTION;  Surgeon: Olivia Mackie, MD;  Location: [facility];  Service: Obstetrics;  Laterality: N/A;  EDD: 03/27/17 Allergy: Codeine  . EYE SURGERY      Current Medications: Current Meds  Medication Sig  . acetaminophen (TYLENOL) 500 MG tablet Take 500 mg by mouth every 4 (four) hours as needed for mild pain or moderate pain.  Marland Kitchen FLUoxetine (PROZAC) 20 MG tablet Take 20 mg by mouth daily.      Allergies:   Codeine   Social History   Socioeconomic History  . Marital status: Married    Spouse name: jeff  . Number of children: 3  . Years of education: Not on file  . Highest education level: Not on file  Occupational History  . Not on file    Social Needs  . Financial resource strain: Not on file  . Food insecurity:    Worry: Not on file    Inability: Not on file  . Transportation needs:    Medical: Not on file    Non-medical: Not on file  Tobacco Use  . Smoking status: Never Smoker  . Smokeless tobacco: Never Used  Substance and Sexual Activity  . Alcohol use: Yes    Comment: 1 q mth  . Drug use: No  . Sexual activity: Yes    Birth control/protection: Condom  Lifestyle  . Physical activity:    Days per week: Not on file    Minutes per session: Not on file  . Stress: Not on file  Relationships  . Social connections:    Talks on phone: Not on file    Gets together: Not on file    Attends religious service: Not on file    Active member of club or organization: Not on file    Attends meetings of clubs or organizations: Not on file    Relationship status: Not on file  Other Topics Concern  . Not on file  Social History Narrative  . Not on file     Family History: The patient's family history includes ADD / ADHD in her son; Anemia in her mother; Anxiety disorder in her mother and son; Cancer in her maternal grandmother; Depression in her mother; GER disease in her mother; Heart attack in her father; ODD in her son; Peripheral vascular disease in her mother; Varicose Veins in her mother.  ROS:   Please see the history of present illness.    All other systems reviewed and are negative.  EKGs/Labs/Other Studies Reviewed:    The following studies were reviewed today: EKG reveals sinus rhythm and nonspecific ST-T changes.   Recent Labs: 06/23/2018: ALT 15; BUN 14; Creatinine, Ser 0.69; Hemoglobin 11.5; Platelets 280.0; Potassium 4.0; Sodium 140  Recent Lipid Panel    Component Value Date/Time   CHOL 138 06/23/2018 0911   TRIG 81.0 06/23/2018 0911   HDL 42.70 06/23/2018 0911   CHOLHDL 3 06/23/2018 0911   VLDL 16.2 06/23/2018 0911   LDLCALC 79 06/23/2018 0911    Physical Exam:    VS:  BP 126/82 (BP  Location: Right Arm, Patient Position: Sitting, Cuff Size: Normal)   Pulse 66   Ht  (1.575 m)   Wt 262 lb (118.8 kg)   LMP 07/25/2018   SpO2 98%   BMI 47.92 kg/m     Wt Readings from Last 3 Encounters:  08/16/18 262 lb (118.8 kg)  08/09/18 265 lb (120.2 kg)  06/23/18 264 lb 3.2 oz (119.8 kg)     GEN: Patient is in no acute distress  HEENT: Normal NECK: No JVD; No carotid bruits LYMPHATICS: No lymphadenopathy CARDIAC: S1 S2 regular, 2/6 systolic murmur at the apex. RESPIRATORY:  Clear to auscultation without rales, wheezing or rhonchi  ABDOMEN: Soft, non-tender, non-distended MUSCULOSKELETAL:  No edema; No deformity  SKIN: Warm and dry NEUROLOGIC:  Alert and oriented x 3 PSYCHIATRIC:  Normal affect    Signed, Garwin Brothers, MD  08/16/2018 9:09 AM    [facility]

## 2018-08-23 ENCOUNTER — Ambulatory Visit (HOSPITAL_BASED_OUTPATIENT_CLINIC_OR_DEPARTMENT_OTHER): Admission: RE | Admit: 2018-08-23 | Payer: No Typology Code available for payment source | Source: Ambulatory Visit

## 2018-08-26 MED FILL — FLUoxetine HCL 20 MG TABS: 20 | 30 days supply | Qty: 30 | Fill #2

## 2018-09-02 ENCOUNTER — Encounter: Payer: Self-pay | Admitting: Family Medicine

## 2018-09-03 ENCOUNTER — Ambulatory Visit (INDEPENDENT_AMBULATORY_CARE_PROVIDER_SITE_OTHER): Payer: No Typology Code available for payment source | Admitting: Family Medicine

## 2018-09-03 ENCOUNTER — Encounter: Payer: Self-pay | Admitting: Family Medicine

## 2018-09-03 ENCOUNTER — Other Ambulatory Visit: Payer: Self-pay

## 2018-09-03 DIAGNOSIS — F331 Major depressive disorder, recurrent, moderate: Secondary | ICD-10-CM

## 2018-09-03 MED ORDER — ESCITALOPRAM OXALATE 10 MG PO TABS: 10.0000 mg | ORAL_TABLET | Freq: Every day | ORAL | 2 refills | Status: DC

## 2018-09-03 MED FILL — ESCITALOPRAM 10 MG TABLET: 10 | 30 days supply | Qty: 30 | Fill #0

## 2018-09-03 NOTE — Progress Notes (Signed)
I have discussed the procedure for the virtual visit with the patient who has given consent to proceed with assessment and treatment.   Tiara S Simmons, CMA     

## 2018-09-03 NOTE — Progress Notes (Signed)
Virtual Visit via Video Note  Subjective  CC:  Chief Complaint  Patient presents with   Depression    She reports that she is feeling overwhelmed and not able to do things at home. She is taking Fluoxetine daily. Her PHQ 9 score today is 14   Anxiety    She thinks her anxiety is stable but depression is getting worse    I connected with [name] on 09/03/18 at  3:00 PM EDT by a video enabled telemedicine application and verified that I am speaking with the correct person using two identifiers. Location patient: Home Location provider: [employer], Office Persons participating in the virtual visit: LYNESE FISCHEL, Willow Ora, MD Rita Ohara, CMA  I discussed the limitations of evaluation and management by telemedicine and the availability of in person appointments. The patient expressed understanding and agreed to proceed.  HPI:  Here for follow up on depression.  Unfortunately, Molley says she is doing worse.  We started this conversation earlier in the month.  At that time she had to start Prozac.  We elected to continue Prozac to see if she improved.  However, especially in the setting of the coronavirus pandemic and home isolation, symptoms of depression are escalating.   Current symptoms include depressed mood, anhedonia, hypersomnia, psychomotor retardation, fatigue, feelings of worthlessness/guilt,  and have been present for the last month.. These symptoms have been progressive in nature.  She denies worsening anxiety symptoms.  Examples of low motivation include having a difficult time doing the chores at her home, taking care of her kids.  Entertaining them while they are out of school.  She is not enjoying things that she used to.  She is eating more than usual and definitely sleeping more than usual. She denies current suicidal or homicidal plan or intent.  She has never felt this depressed before.  About 2 years ago she was treated  with Lexapro and she did fairly well on that.  It was stopped due to her most recent pregnancy.  She is not currently breast-feeding. Depression screen [facility] 2/9 09/03/2018 08/09/2018 06/23/2018  Decreased Interest 1 1 1   Down, Depressed, Hopeless 3 2 1   PHQ - 2 Score 4 3 2   Altered sleeping 0 3 1  Tired, decreased energy 3 3 2   Change in appetite 1 1 2   Feeling bad or failure about yourself  3 1 2   Trouble concentrating 1 1 1   Moving slowly or fidgety/restless 2 1 2   Suicidal thoughts 0 0 0  PHQ-9 Score 14 13 12   Difficult doing work/chores Not difficult at all Very difficult Somewhat difficult    Previously on prescription medications for mood/anxiety: Lexapro Therapist/counseling: Never Previous Diagnosis of psychiatric disorder: Major depression Family history of psychiatric disorder: Unknown  Assessment  1. Moderate recurrent major depression (HCC)      Plan   Depression: Worsening clinical depression.  Counseling done.  After visit summary with instructions on care recommended.  Will wean Prozac and start Lexapro as this is worked well for her in the past.  Recheck if short-term follow-up in 4 weeks.  Video visit scheduled.  I discussed the assessment and treatment plan with the patient. The patient was provided an opportunity to ask questions and all were answered. The patient agreed with the plan and demonstrated an understanding of the instructions.   The patient was advised to call back or seek an in-person evaluation if the symptoms worsen or  if the condition fails to improve as anticipated. Follow up: Return in about 4 weeks (around 10/01/2018) for mood follow up.  11/08/2018  Meds ordered this encounter  Medications   escitalopram (LEXAPRO) 10 MG tablet    Sig: Take 1 tablet (10 mg total) by mouth daily.    Dispense:  30 tablet    Refill:  2      I reviewed the patients updated PMH, FH, and SocHx.    Patient Active Problem List   Diagnosis Date Noted   Moderate  recurrent major depression (HCC) 08/09/2018   Personal history of congenital hip dysplasia 03/23/2018   History of postpartum depression 03/23/2018   Anti-Duffy antibodies present 03/26/2017   Congenital subvalvular aortic stenosis -repaired 03/26/2017   Deaf - LEFT ear 03/26/2017   Aortic valve insufficiency 11/25/2016   Klippel-Feil syndrome 11/25/2016   Family history of Klippel-Feil syndrome 10/03/2016   Current Meds  Medication Sig   acetaminophen (TYLENOL) 500 MG tablet Take 500 mg by mouth every 4 (four) hours as needed for mild pain or moderate pain.   [DISCONTINUED] FLUoxetine (PROZAC) 20 MG tablet Take 20 mg by mouth daily.     Allergies: Patient is allergic to codeine. Family History: Patient family history includes ADD / ADHD in her son; Anemia in her mother; Anxiety disorder in her mother and son; Cancer in her maternal grandmother; Depression in her mother; GER disease in her mother; Heart attack in her father; ODD in her son; Peripheral vascular disease in her mother; Varicose Veins in her mother. Social History:  Patient  reports that she has never smoked. She has never used smokeless tobacco. She reports current alcohol use. She reports that she does not use drugs.  @OBJECTIVE /OBSERVATIONS@ General: no acute distress, well appearing, no apparent distress, well groomed Psych:  Alert and oriented x 3,normal mood, behavior, speech, dress, and thought processes. depressed affect I provided 30 minutes of non-face-to-face time during this encounter. Willow Ora, MD

## 2018-09-03 NOTE — Patient Instructions (Addendum)
Please follow up in 4 weeks for a recheck on mood (video visit).  If you have any questions or concerns, please don't hesitate to send me a message via MyChart or call the office at [phone]. Thank you for visiting with [place] today! It's our pleasure caring for you.  Please wean Prozac as follows:  Take Prozac 10mg  daily for one week (cut the tablet in half), Then take 10mg  (1/2 tab) every other day for a week, Then take 10mg  on T and Fri, then stop; during this 3rd week, START the Lexapro 10mg  daily.   When you're feeling too down to do anything, try these 10 Little things!  Take a shower. Even if you plan to stay in all day long and not see a soul, take a shower. It takes the most effort to hop in to the shower but once you do, you'll feel immediate results. It will wake you up and you'll be feeling much fresher (and cleaner too).  Brush and floss your teeth. Give your teeth a good brushing with a floss finish. It's a small task but it feels so good and you can check 'taking care of your health' off the list of things to do.  Do something small on your list. Most of [place] have some small thing we would like to get done (load of laundry, sew a button, email a friend). Doing one of these things will make you feel like you've accomplished something.  Drink water. Drinking water is easy right? It's also really beneficial for your health so keep a glass beside you all day and take sips often. It gives you energy and prevents you from boredom eating.  Do some floor exercises. The last thing you want to do is exercise but it might be just the thing you need the most. Keep it simple and do exercises that involve sitting or laying on the floor. Even the smallest of exercises release chemicals in the brain that make you feel good. Yoga stretches or core exercises are going to make you feel good with minimal effort.  Make your bed. Making your bed takes a few minutes but it's productive and you'll  feel relieved when it's done. An unmade bed is a huge visual reminder that you're having an unproductive day. Do it and consider it your housework for the day.  Put on some nice clothes. Take the sweatpants off even if you don't plan to go anywhere. Put on clothes that make you feel good. Take a look in the mirror so your brain recognizes the sweatpants have been replaced with clothes that make you look great. It's an instant confidence booster.  Wash the dishes. A pile of dirty dishes in the sink is a reflection of your mood. It's possible that if you wash up the dishes, your mood will follow suit. It's worth a try.  Cook a real meal. If you have the luxury to have a "do nothing" day, you have time to make a real meal for yourself. Make a meal that you love to eat. The process is good to get you out of the funk and the food will ensure you have more energy for tomorrow.  Write out your thoughts by hand. When you hand write, you stimulate your brain to focus on the moment that you're in so make yourself comfortable and write whatever comes into your mind. Put those thoughts out on paper so they stop spinning around in your head. Those thoughts might be  the very thing holding you down.

## 2018-10-01 ENCOUNTER — Encounter: Payer: Self-pay | Admitting: Family Medicine

## 2018-10-01 ENCOUNTER — Ambulatory Visit (INDEPENDENT_AMBULATORY_CARE_PROVIDER_SITE_OTHER): Payer: No Typology Code available for payment source | Admitting: Family Medicine

## 2018-10-01 ENCOUNTER — Other Ambulatory Visit: Payer: Self-pay

## 2018-10-01 DIAGNOSIS — R41840 Attention and concentration deficit: Secondary | ICD-10-CM | POA: Diagnosis not present

## 2018-10-01 DIAGNOSIS — F331 Major depressive disorder, recurrent, moderate: Secondary | ICD-10-CM

## 2018-10-01 MED ORDER — METHYLPHENIDATE HCL ER (LA) 10 MG PO CP24: 10.0000 mg | ORAL_CAPSULE | Freq: Every day | ORAL | 0 refills | Status: DC

## 2018-10-01 MED FILL — METHYLPHENIDATE ER (LA) 10M: 10 | 30 days supply | Qty: 30 | Fill #0

## 2018-10-01 NOTE — Progress Notes (Signed)
Virtual Visit via Video Note  SUBJECTIVE CC:  Chief Complaint  Patient presents with  . Depression    Prozac was stopped and Lexapro added.. She reports that she has felt better since coming off Prozac.Marland Kitchen. Her PHQ9 score today is 12    I connected with Kristen PartyAshley R Gorgas on 10/01/18 at  3:00 PM EDT by a video enabled telemedicine application and verified that I am speaking with the correct person using two identifiers. Location patient: Home [facility] provider[facility] [place] Persons participating in the virtual visit: [name], Willow Oraamille L Tynisha Ogan, MD Rita Oharaiara Simmons, CMA   I discussed the limitations of evaluation and management by telemedicine and the availability of in person appointments. The patient expressed understanding and agreed to proceed.  HPI: [name] is a 38 y.o. female who was contacted today to address the problems listed above in the chief complaint/mood.  Follow-up for recurrent clinical depression.  See last visits.  Initially started Lexapro about a week ago.  She noticed the coming off the Prozac, she felt significantly better and thinks she may have been having negative side effects from it.  Since starting Lexapro, she is experiencing fatigue as a side effect again.  Decreased energy and feeling foggy for her complaints.  Her mood remains about the same.  Her current concerns are poor focus and inattention.  She is having trouble completing home tasks.  In looking back this might be chronic in nature but since her mood was better she could always overcome it.  Now she feels like she is just not processing well.  No neurologic deficits.  No vision changes.  No headaches.  Her son and her husband have ADD and now that she has been researching that more, she questions this diagnosis.  She has no hyperactivity.  [facility] 2/9 10/01/2018 09/03/2018 08/09/2018  Decreased Interest 1 1 1   Down, Depressed, Hopeless 1 3 2   PHQ - 2  Score 2 4 3   Altered sleeping 2 0 3  Tired, decreased energy 3 3 3   Change in appetite 1 1 1   Feeling bad or failure about yourself  1 3 1   Trouble concentrating 3 1 1   Moving slowly or fidgety/restless 0 2 1  Suicidal thoughts 0 0 0  PHQ-9 Score 12 14 13   Difficult doing work/chores Not difficult at all Not difficult at all Very difficult   GAD 7 : Generalized Anxiety Score 06/23/2018  Nervous, Anxious, on Edge 2  Control/stop worrying 2  Worry too much - different things 2  Trouble relaxing 2  Restless 0  Easily annoyed or irritable 3  Afraid - awful might happen 1  Total GAD 7 Score 12  Anxiety Difficulty Very difficult     ASSESSMENT 1. Moderate recurrent major depression (HCC)   2. Inattention      Depression:  Remains active and sensitive to medications. Will decrease lexapro to 5mg  daily and see if she can tolerate it. Continue self supportive care.   Possible ADD: could be unmasked more now due to active clinical depression. Trial of stimulant. May also counter side effect of fatigue from lexapro.   Recheck 4 weeks.   I discussed the assessment and treatment plan with the patient. The patient was provided an opportunity to ask questions and all were answered. The patient agreed with the plan and demonstrated an understanding of the instructions.   The patient was advised to call back or seek an  in-person evaluation if the symptoms worsen or if the condition fails to improve as anticipated. Follow up: Return in about 4 weeks (around 10/29/2018) for mood follow up, follow up on ADD.  11/08/2018  Meds ordered this encounter  Medications  . methylphenidate (RITALIN LA) 10 MG 24 hr capsule    Sig: Take 1 capsule (10 mg total) by mouth daily.    Dispense:  30 capsule    Refill:  0      I reviewed the patients updated PMH, FH, and SocHx.    Patient Active Problem List   Diagnosis Date Noted  . Moderate recurrent major depression (HCC) 08/09/2018  . Personal  history of congenital hip dysplasia 03/23/2018  . History of postpartum depression 03/23/2018  . 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 Congenital subvalvular aortic stenosis -repaired 03/26/2017  . Deaf - LEFT ear 03/26/2017  . Aortic valve insufficiency 11/25/2016  . 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 Family history of Klippel-Feil syndrome 10/03/2016   Current Meds  Medication Sig  . acetaminophen (TYLENOL) 500 MG tablet Take 500 mg by mouth every 4 (four) hours as needed for mild pain or moderate pain.  Marland Kitchen escitalopram (LEXAPRO) 10 MG tablet Take 1 tablet (10 mg total) by mouth daily.    Allergies: Patient is allergic to codeine. Family History: Patient family history includes ADD / ADHD in her son; Anemia in her mother; Anxiety disorder in her mother and son; Cancer in her maternal grandmother; Depression in her mother; GER disease in her mother; Heart attack in her father; ODD in her son; Peripheral vascular disease in her mother; Varicose Veins in her mother. Social History:  Patient  reports that she has never smoked. She has never used smokeless tobacco. She reports current alcohol use. She reports that she does not use drugs.  Review of Systems: Constitutional: Negative for fever malaise or anorexia Cardiovascular: negative for chest pain Respiratory: negative for SOB or persistent cough Gastrointestinal: negative for abdominal pain  OBJECTIVE/OBSERVATIONS: General: no acute distress, well appearing, no apparent distress, well groomed Psych:  Alert and oriented x 3,normal mood, behavior, speech, dress, and thought processes. Appears happier, well groomed. smiles  Willow Ora, MD

## 2018-10-01 NOTE — Patient Instructions (Signed)
Please return in 4-6 weeks to recheck mood and inattention.  If you have any questions or concerns, please don't hesitate to send me a message via MyChart or call the office at [phone]. Thank you for visiting with [place] today! It's our pleasure caring for you.

## 2018-10-14 ENCOUNTER — Ambulatory Visit (INDEPENDENT_AMBULATORY_CARE_PROVIDER_SITE_OTHER): Payer: Self-pay | Admitting: Nurse Practitioner

## 2018-10-14 VITALS — BP 120/82 | HR 89 | Temp 98.5°F | Resp 16 | Wt 275.0 lb

## 2018-10-14 DIAGNOSIS — R21 Rash and other nonspecific skin eruption: Secondary | ICD-10-CM

## 2018-10-14 MED ORDER — TRIAMCINOLONE ACETONIDE 0.1 % EX CREA: 1.0000 "application " | TOPICAL_CREAM | Freq: Two times a day (BID) | CUTANEOUS | 0 refills | Status: DC

## 2018-10-14 NOTE — Patient Instructions (Addendum)
Rash, Adult  -Use medication as directed. -I would like for you to purchase over-the-counter Zyrtec to take daily to help with itching for daytime.  I would like for you to use Benadryl 25-50mg  at bedtime. -I would also like for you to purchase over-the-counter Aveeno colloidal oatmeal bath to use when bathing at least twice daily if symptoms improve. -Avoid scratching or irritating the affected areas. -Avoid bathing in hot water.  You should use lukewarm water until symptoms improve. -Follow up if symptoms do not improve within the next 5 to 7 days.   A rash is a change in the color of your skin. A rash can also change the way your skin feels. There are many different conditions and factors that can cause a rash. Some rashes may disappear after a few days, but some may last for a few weeks. Common causes of rashes include:  Viral infections, such as: ? Colds. ? Measles. ? Hand, foot, and mouth disease.  Bacterial infections, such as: ? Scarlet fever. ? Impetigo.  Fungal infections, such as Candida.  Allergic reactions to food, medicines, or skin care products. Follow these instructions at home: The goal of treatment is to stop the itching and keep the rash from spreading. Pay attention to any changes in your symptoms. Follow these instructions to help with your condition: Medicine Take or apply over-the-counter and prescription medicines only as told by your health care provider. These may include:  Corticosteroid creams to treat red or swollen skin.  Anti-itch lotions.  Oral allergy medicines (antihistamines).  Oral corticosteroids for severe symptoms.  Skin care  Apply cool compresses to the affected areas.  Do not scratch or rub your skin.  Avoid covering the rash. Make sure the rash is exposed to air as much as possible. Managing itching and discomfort  Avoid hot showers or baths, which can make itching worse. A cold shower may help.  Try taking a bath with: ?  Epsom salts. Follow manufacturer instructions on the packaging. You can get these at your local pharmacy or grocery store. ? Baking soda. Pour a small amount into the bath as told by your health care provider. ? Colloidal oatmeal. Follow manufacturer instructions on the packaging. You can get this at your local pharmacy or grocery store.  Try applying baking soda paste to your skin. Stir water into baking soda until it reaches a paste-like consistency.  Try applying calamine lotion. This is an over-the-counter lotion that helps to relieve itchiness.  Keep cool and out of the sun. Sweating and being hot can make itching worse. General instructions   Rest as needed.  Drink enough fluid to keep your urine pale yellow.  Wear loose-fitting clothing.  Avoid scented soaps, detergents, and perfumes. Use gentle soaps, detergents, perfumes, and other cosmetic products.  Avoid any substance that causes your rash. Keep a journal to help track what causes your rash. Write down: ? What you eat. ? What cosmetic products you use. ? What you drink. ? What you wear. This includes jewelry.  Keep all follow-up visits as told by your health care provider. This is important. Contact a health care provider if:  You sweat at night.  You lose weight.  You urinate more than normal.  You urinate less than normal, or you notice that your urine is a darker color than usual.  You feel weak.  You vomit.  Your skin or the whites of your eyes look yellow (jaundice).  Your skin: ? Tingles. ? Is  numb.  Your rash: ? Does not go away after several days. ? Gets worse.  You are: ? Unusually thirsty. ? More tired than normal.  You have: ? New symptoms. ? Pain in your abdomen. ? A fever. ? Diarrhea. Get help right away if you:  Have a fever and your symptoms suddenly get worse.  Develop confusion.  Have a severe headache or a stiff neck.  Have severe joint pains or stiffness.  Have a  seizure.  Develop a rash that covers all or most of your body. The rash may or may not be painful.  Develop blisters that: ? Are on top of the rash. ? Grow larger or grow together. ? Are painful. ? Are inside your nose or mouth.  Develop a rash that: ? Looks like purple pinprick-sized spots all over your body. ? Has a "bull's eye" or looks like a target. ? Is not related to sun exposure, is red and painful, and causes your skin to peel. Summary  A rash is a change in the color of your skin. Some rashes disappear after a few days, but some may last for a few weeks.  The goal of treatment is to stop the itching and keep the rash from spreading.  Take or apply over-the-counter and prescription medicines only as told by your health care provider.  Contact a health care provider if you have new or worsening symptoms.  Keep all follow-up visits as told by your health care provider. This is important. This information is not intended to replace advice given to you by your health care provider. Make sure you discuss any questions you have with your health care provider. Document Released: 05/16/2002 Document Revised: 12/28/2017 Document Reviewed: 12/28/2017 Elsevier Interactive Patient Education  2019 [employer]

## 2018-10-16 NOTE — Progress Notes (Signed)
[name]  MRN: [id number] DOB: [birth year]  Subjective:  [name] is a 38 y.o. female seen in office today for a chief complaint of rash involving the arms, chest and abdomen. Rash started 1 week ago. Lesions are erythematous, and flat and raised with scabbing to some areas. in texture. Rash has changed over time. Rash is pruritic. Associated symptoms: none. Patient denies: abdominal pain, arthralgia, congestion, cough, crankiness, decrease in appetite, decrease in energy level, fever, headache, irritability, myalgia, nausea, sore throat and vomiting. Patient has had contacts with similar rash. Patient has not had new exposures (soaps, lotions, laundry detergents, foods, medications, plants, insects or animals).  The following portions of the patient's history were reviewed and updated as appropriate: allergies, current medications and past medical history.   Review of Systems  Constitutional: Negative.   HENT: Negative.   Eyes: Negative.   Respiratory: Negative.   Cardiovascular: Negative.   Gastrointestinal: Negative.   Skin: Positive for rash.  Allergic/Immunologic: Negative for environmental allergies.  Neurological: Negative.     Patient Active Problem List   Diagnosis Date Noted  . Moderate recurrent major depression (HCC) 08/09/2018  . Personal history of congenital hip dysplasia 03/23/2018  . History of postpartum depression 03/23/2018  . 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 Congenital subvalvular aortic stenosis -repaired 03/26/2017  . Deaf - LEFT ear 03/26/2017  . Aortic valve insufficiency 11/25/2016  . 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 Family history of Klippel-Feil syndrome 10/03/2016    Current Outpatient Medications on File Prior to Visit  Medication Sig Dispense Refill  . escitalopram (LEXAPRO) 10 MG tablet Take 1 tablet (10 mg total) by mouth daily. 30 tablet 2  . methylphenidate (RITALIN LA) 10 MG 24 hr capsule Take 1 capsule (10  mg total) by mouth daily. 30 capsule 0  . acetaminophen (TYLENOL) 500 MG tablet Take 500 mg by mouth every 4 (four) hours as needed for mild pain or moderate pain.     No current facility-administered medications on file prior to visit.     Allergies  Allergen Reactions  . Codeine Hives     Objective:  BP 120/82   Pulse 89   Temp 98.5 F (36.9 C)   Resp 16   Wt 275 lb (124.7 kg)   SpO2 98%   BMI 50.30 kg/m   Physical Exam Vitals signs reviewed.  Constitutional:      General: She is not in acute distress. HENT:     Head: Normocephalic.     Right Ear: Tympanic membrane, ear canal and external ear normal.     Left Ear: External ear normal.     Ears:     Comments: Left ear atresia    Nose: Nose normal.     Mouth/Throat:     Mouth: Mucous membranes are moist.     Pharynx: No oropharyngeal exudate or posterior oropharyngeal erythema.  Eyes:     Conjunctiva/sclera: Conjunctivae normal.     Pupils: Pupils are equal, round, and reactive to light.  Neck:     Musculoskeletal: Normal range of motion and neck supple.  Cardiovascular:     Rate and Rhythm: Normal rate and regular rhythm.     Pulses: Normal pulses.     Heart sounds: Normal heart sounds.  Pulmonary:     Effort: Pulmonary effort is normal. No respiratory distress.     Breath sounds: Normal breath sounds. No stridor. No wheezing, rhonchi or rales.  Abdominal:     General: Bowel  sounds are normal.     Palpations: Abdomen is soft.     Tenderness: There is no abdominal tenderness.  Skin:    General: Skin is warm and dry.     Capillary Refill: Capillary refill takes less than 2 seconds.     Findings: Erythema and rash present. No petechiae. Rash is urticarial. Rash is not purpuric.     Comments: Diffuse areas of pinpoint scabbing with erythematous base on UEs and abdomen most likely resulting from scratching, noted striae to abdomen. Chest region erythematous, crusting, no vesicles, scaling, or pustules noted.    Neurological:     General: No focal deficit present.     Mental Status: She is alert.  Psychiatric:        Mood and Affect: Mood normal.        Behavior: Behavior normal.               Assessment and Plan :   Exam findings, diagnosis etiology and medication use and indications reviewed with patient. Follow- Up and discharge instructions provided. No emergent/urgent issues found on exam. Origin of patient's rash is uclear at this time.  DDx: contact dermatitis, atopic dermatitis, insect bites. I am going to provide the patient with triamcinolone cream for symptomatic relief, as her complaint is itching.  Patient will also be instructed to use Zyrtec and Benadryl for itching.  The rash does not present as contagious and is not infected at this time. Discussed with patient that rash could possible reoccur without knowing trigger.  Further discussed with patient that symptoms should improve with symptomatic treatment, topically.  I do not recommend an oral steroid at this time.  Additional symptomatic treatment was provided to the patient. The patient does not demonstrate any symptoms consistent wiPatient education was provided. Patient verbalized understanding of information provided and agrees with plan of care (POC), all questions answered. The patient is advised to call or return to clinic if condition does not see an improvement in symptoms, or to seek the care of the closest emergency department if condition worsens with the above plan.    1. Rash and nonspecific skin eruption  - triamcinolone cream (KENALOG) 0.1 %; Apply 1 application topically 2 (two) times daily.  Dispense: 30 g; Refill: 0 -Use medication as directed. -I would like for you to purchase over-the-counter Zyrtec to take daily to help with itching for daytime.  I would like for you to use Benadryl 25-50mg  at bedtime. -I would also like for you to purchase over-the-counter Aveeno colloidal oatmeal bath to use when  bathing at least twice daily if symptoms improve. -Avoid scratching or irritating the affected areas. -Avoid bathing in hot water.  You should use lukewarm water until symptoms improve. -Follow up if symptoms do not improve within the next 5 to 7 days.

## 2018-10-21 ENCOUNTER — Other Ambulatory Visit (HOSPITAL_BASED_OUTPATIENT_CLINIC_OR_DEPARTMENT_OTHER): Payer: No Typology Code available for payment source

## 2018-10-22 ENCOUNTER — Encounter: Payer: Self-pay | Admitting: Family Medicine

## 2018-10-22 ENCOUNTER — Telehealth: Payer: No Typology Code available for payment source | Admitting: Nurse Practitioner

## 2018-10-22 DIAGNOSIS — B86 Scabies: Secondary | ICD-10-CM

## 2018-10-22 MED ORDER — PERMETHRIN 5 % EX CREA: 1.0000 "application " | TOPICAL_CREAM | Freq: Once | CUTANEOUS | 0 refills | Status: AC

## 2018-10-22 NOTE — Progress Notes (Signed)
E-Visit for Scabies  We are sorry that you are not feeling well. Here is how we plan to help!  Based on what you shared with me it looks like you have scabies.  Scabies is an infection caused by very tiny mites that burrow into the skin.  They cause severe itching. Though children are most commonly infected, anyone can get scabies.  Scabies mites can pass from person to person through physical close contact.  They can also be passed through shared clothing, towels, and bedding.  Scabies infection is not usually dangerous, but it is uncomfortable.  Because it is so contagious, scabies should be treated immediately to keep the infection from spreading.  If you have children in day care, please have any exposed children examined by their pediatrician. .   I have prescribed Permethrin topical cream 5% thoroughly massage cream from head to soles of feet; leave on for 8 to 14 hours before removing (shower or bath). May repeat if living mites are observed 14 days after first treatment; one application is generally curative.    HOME CARE:  . You should treat all members in your household whether they show symptoms or not. . Wash towels, clothes, bed linens, cloth toys, hats, and other personal items in hot water and dry on high heat. . Vacuum floors and furniture and throw away the bag afterwards. . Keep your child home from day care or school until the morning after treatment for scabies. . Notify your child's day care or school so that other children can be checked and treated.  GET HELP RIGHT AWAY IF:  . The infected person has a fever, red streaks, pain, or swelling of the skin. . Sores get worse or don't heal. . New rashes appear or itching continues for more than 2 weeks after treatment.  MAKE SURE YOU:   Understand these instructions.  Will watch your condition.  Will get help right away if you are not doing well or get worse.  Thank you for choosing an e-visit. Your e-visit answers  were reviewed by a board certified advanced clinical practitioner to complete your personal care plan.  Depending upon the condition, your plan could have included both over the counter or prescription medications.    Please review your pharmacy choice.  Make sure the pharmacy is open so you can pick up prescription now.   If there is a problem, you may contact your provider through MyChart messaging and have the prescription routed to another pharmacy. Your safety is important to us.  If you have drug allergies check your prescription carefully.    For the next 24 hours you can use MyChart to ask questions about today's visit, request a non-urgent call back, or ask for a work or school excuse.  You will get an email in the next 2 days asking about your experience.  I hope that your e-visit has been valuable and will speed your recovery.    

## 2018-10-23 ENCOUNTER — Ambulatory Visit: Payer: No Typology Code available for payment source | Admitting: Family Medicine

## 2018-10-25 MED ORDER — PERMETHRIN 5 % EX CREA: TOPICAL_CREAM | CUTANEOUS | 0 refills | Status: DC

## 2018-10-29 ENCOUNTER — Encounter: Payer: Self-pay | Admitting: Family Medicine

## 2018-10-29 ENCOUNTER — Other Ambulatory Visit: Payer: Self-pay

## 2018-10-29 ENCOUNTER — Ambulatory Visit (INDEPENDENT_AMBULATORY_CARE_PROVIDER_SITE_OTHER): Payer: No Typology Code available for payment source | Admitting: Family Medicine

## 2018-10-29 VITALS — Wt 275.0 lb

## 2018-10-29 DIAGNOSIS — R41840 Attention and concentration deficit: Secondary | ICD-10-CM

## 2018-10-29 DIAGNOSIS — F988 Other specified behavioral and emotional disorders with onset usually occurring in childhood and adolescence: Secondary | ICD-10-CM | POA: Insufficient documentation

## 2018-10-29 DIAGNOSIS — F331 Major depressive disorder, recurrent, moderate: Secondary | ICD-10-CM

## 2018-10-29 MED ORDER — ESCITALOPRAM OXALATE 10 MG PO TABS: 5.0000 mg | ORAL_TABLET | Freq: Every day | ORAL | 3 refills | Status: DC

## 2018-10-29 MED ORDER — METHYLPHENIDATE HCL ER (LA) 20 MG PO CP24: 20.0000 mg | ORAL_CAPSULE | Freq: Every day | ORAL | 0 refills | Status: DC

## 2018-10-29 MED ORDER — ESCITALOPRAM OXALATE 10 MG PO TABS: 5.0000 mg | ORAL_TABLET | Freq: Every day | ORAL | 2 refills | Status: DC

## 2018-10-29 MED FILL — ESCITALOPRAM 10 MG TABLET: 10 | 90 days supply | Qty: 45 | Fill #0

## 2018-10-29 MED FILL — METHYLPHENIDATE ER 20 MG CA: 20 | 30 days supply | Qty: 30 | Fill #0

## 2018-10-29 NOTE — Progress Notes (Signed)
Virtual Visit via Video Note  SUBJECTIVE CC:  Chief Complaint  Patient presents with  . Depression  . ADHD    I connected with [name] on 10/29/18 at  2:20 PM EDT by a video enabled telemedicine application and verified that I am speaking with the correct person using two identifiers. Location patient: Home Location provider: [facility] Persons participating in the virtual visit: [name], Willow Ora, MD Rita Ohara, CMA   I discussed the limitations of evaluation and management by telemedicine and the availability of in person appointments. The patient expressed understanding and agreed to proceed.  HPI: [name] is a 38 y.o. female who was contacted today to address the problems listed above in the chief complaint/mood.  Short term f/u for depression and possible adhd. See last notes. Started ritalin low dose and cut back on lexapro dose due to SE of fatigue and feeling foggy. Doing better. Mood is lifted, fog has lifted and no longer feeling fatigued. Feels ritalin has helped lessen distractibility. Energy remains low however, no AEs.   Depression screen [facility] 2/9 10/29/2018 10/01/2018 09/03/2018  Decreased Interest 2 1 1   Down, Depressed, Hopeless 1 1 3   PHQ - 2 Score 3 2 4   Altered sleeping 1 2 0  Tired, decreased energy 1 3 3   Change in appetite 0 1 1  Feeling bad or failure about yourself  0 1 3  Trouble concentrating 0 3 1  Moving slowly or fidgety/restless 1 0 2  Suicidal thoughts 0 0 0  PHQ-9 Score 6 12 14   Difficult doing work/chores Not difficult at all Not difficult at all Not difficult at all   GAD 7 : Generalized Anxiety Score 06/23/2018  Nervous, Anxious, on Edge 2  Control/stop worrying 2  Worry too much - different things 2  Trouble relaxing 2  Restless 0  Easily annoyed or irritable 3  Afraid - awful might happen 1  Total GAD 7 Score 12  Anxiety Difficulty Very difficult     ASSESSMENT 1. Moderate recurrent major depression (HCC)   2. Inattention   3. Attention deficit disorder (ADD) without hyperactivity      Depression:  Improving. conitnue lexapro at 5 daily  ADD: likely dx. Increase ritatlin to 20-40 and reassess in 8 weeks. Educated on expectations and possible AEs.   I discussed the assessment and treatment plan with the patient. The patient was provided an opportunity to ask questions and all were answered. The patient agreed with the plan and demonstrated an understanding of the instructions.   The patient was advised to call back or seek an in-person evaluation if the symptoms worsen or if the condition fails to improve as anticipated. Follow up: Return in about 8 weeks (around 12/24/2018) for follow up on ADD, mood follow up.  Visit date not found  Meds ordered this encounter  Medications  . DISCONTD: escitalopram (LEXAPRO) 10 MG tablet    Sig: Take 0.5 tablets (5 mg total) by mouth daily.    Dispense:  30 tablet    Refill:  2  . methylphenidate (RITALIN LA) 20 MG 24 hr capsule    Sig: Take 1 capsule (20 mg total) by mouth daily.    Dispense:  30 capsule    Refill:  0  . escitalopram (LEXAPRO) 10 MG tablet    Sig: Take 0.5 tablets (5 mg total) by mouth daily.    Dispense:  45  tablet    Refill:  3      I reviewed the patients updated PMH, FH, and SocHx.    Patient Active Problem List   Diagnosis Date Noted  . Attention deficit disorder (ADD) without hyperactivity 10/29/2018  . Moderate recurrent major depression (HCC) 08/09/2018  . Personal history of congenital hip dysplasia 03/23/2018  . History of postpartum depression 03/23/2018  . 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 Congenital subvalvular aortic stenosis -repaired 03/26/2017  . Deaf - LEFT ear 03/26/2017  . Aortic valve insufficiency 11/25/2016  . 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 Family history of Klippel-Feil syndrome 10/03/2016   Current Meds  Medication Sig   . acetaminophen (TYLENOL) 500 MG tablet Take 500 mg by mouth every 4 (four) hours as needed for mild pain or moderate pain.  Marland Kitchen. escitalopram (LEXAPRO) 10 MG tablet Take 0.5 tablets (5 mg total) by mouth daily.  . methylphenidate (RITALIN LA) 20 MG 24 hr capsule Take 1 capsule (20 mg total) by mouth daily.  . [DISCONTINUED] escitalopram (LEXAPRO) 10 MG tablet Take 1 tablet (10 mg total) by mouth daily.  . [DISCONTINUED] escitalopram (LEXAPRO) 10 MG tablet Take 0.5 tablets (5 mg total) by mouth daily.  . [DISCONTINUED] methylphenidate (RITALIN LA) 10 MG 24 hr capsule Take 1 capsule (10 mg total) by mouth daily.  . [DISCONTINUED] permethrin (ELIMITE) 5 % cream Apply to skin and leave on overnight, then shower off in the am  . [DISCONTINUED] triamcinolone cream (KENALOG) 0.1 % Apply 1 application topically 2 (two) times daily.    Allergies: Patient is allergic to codeine. Family History: Patient family history includes ADD / ADHD in her son; Anemia in her mother; Anxiety disorder in her mother and son; Cancer in her maternal grandmother; Depression in her mother; GER disease in her mother; Heart attack in her father; ODD in her son; Peripheral vascular disease in her mother; Varicose Veins in her mother. Social History:  Patient  reports that she has never smoked. She has never used smokeless tobacco. She reports current alcohol use. She reports that she does not use drugs.  Review of Systems: Constitutional: Negative for fever malaise or anorexia Cardiovascular: negative for chest pain Respiratory: negative for SOB or persistent cough Gastrointestinal: negative for abdominal pain  OBJECTIVE/OBSERVATIONS: General: no acute distress, well appearing, no apparent distress, well groomed Psych:  Alert and oriented x 3,. normal affect  Willow Oraamille L Narek Kniss, MD

## 2018-11-08 ENCOUNTER — Ambulatory Visit: Payer: No Typology Code available for payment source | Admitting: Family Medicine

## 2018-12-14 ENCOUNTER — Other Ambulatory Visit: Payer: Self-pay | Admitting: Family Medicine

## 2018-12-14 NOTE — Telephone Encounter (Signed)
Medication Refill - Medication: methylphenidate (RITALIN LA) 20 MG 24 hr capsule   Has the patient contacted their pharmacy? No. (Agent: If no, request that the patient contact the pharmacy for the refill.) (Agent: If yes, when and what did the pharmacy advise?)  Preferred Pharmacy (with phone number or street name):  [place], [place] - [place] [phone] (Phone) [phone] (Fax)     Agent: Please be advised that RX refills may take up to 3 business days. We ask that you follow-up with your pharmacy.

## 2018-12-15 NOTE — Telephone Encounter (Signed)
See note

## 2018-12-16 NOTE — Telephone Encounter (Signed)
Please verify for me patient not pregnant or breast-feeding

## 2018-12-17 ENCOUNTER — Encounter: Payer: Self-pay | Admitting: *Deleted

## 2018-12-17 MED ORDER — METHYLPHENIDATE HCL ER (LA) 20 MG PO CP24: 20.0000 mg | ORAL_CAPSULE | Freq: Every day | ORAL | 0 refills | Status: DC

## 2018-12-17 NOTE — Telephone Encounter (Signed)
30-day refill provided-next refill should be with Dr. Jonni Sanger

## 2018-12-17 NOTE — Telephone Encounter (Signed)
Spoke with Mrs. Hutt, she verified that she is not pregnant nor breast feeding.

## 2018-12-18 MED FILL — METHYLPHENIDATE ER 20 MG CA: 20 | 30 days supply | Qty: 30 | Fill #0

## 2018-12-24 ENCOUNTER — Ambulatory Visit (INDEPENDENT_AMBULATORY_CARE_PROVIDER_SITE_OTHER): Payer: No Typology Code available for payment source | Admitting: Family Medicine

## 2018-12-24 ENCOUNTER — Encounter: Payer: Self-pay | Admitting: Family Medicine

## 2018-12-24 DIAGNOSIS — F988 Other specified behavioral and emotional disorders with onset usually occurring in childhood and adolescence: Secondary | ICD-10-CM | POA: Diagnosis not present

## 2018-12-24 DIAGNOSIS — F331 Major depressive disorder, recurrent, moderate: Secondary | ICD-10-CM | POA: Diagnosis not present

## 2018-12-24 MED ORDER — METHYLPHENIDATE HCL ER (LA) 20 MG PO CP24: 20.0000 mg | ORAL_CAPSULE | Freq: Every day | ORAL | 0 refills | Status: DC

## 2018-12-24 NOTE — Progress Notes (Signed)
Virtual Visit via Video Note  Subjective  CC:  Chief Complaint  Patient presents with  . Depression    Reports that she is feeling much better  . ADHD    Medication working well     I connected with Kristen PartyAshley R Bartek on 12/24/18 at  1:40 PM EDT by a video enabled telemedicine application and verified that I am speaking with the correct person using two identifiers. Location patient: Home Location provider: [facility] [address] Persons participating in the virtual visit: [name], Willow Oraamille L Oval Cavazos, MD Rita Oharaiara Simmons, CMA  I discussed the limitations of evaluation and management by telemedicine and the availability of in person appointments. The patient expressed understanding and agreed to proceed. HPI: Kristen Partyshley R Garton is a 38 y.o. female who was contacted today to address the problems listed above in the chief complaint. . Depression f/u: resolved. Energy and mood are back to normal. Coping well with stressors, home care, children etc. Hasn't take lexapro consistently for last 6-8 weeks. Had taken it consistently for 8 weeks prior . ADD is doing great. No concerns with sxs nor meds.   Assessment  1. Attention deficit disorder (ADD) without hyperactivity   2. Moderate recurrent major depression (HCC)      Plan   ADD:  This medical condition is well controlled. There are no signs of complications, medication side effects, or red flags. Patient is instructed to continue the current treatment plan without change in therapies or medications.   Depression: in remission. Will d/c lexapro now with close monitoring for relapse sxs. Pt aware it is possible and would restart meds if happens.  I discussed the assessment and treatment plan with the patient. The patient was provided an opportunity to ask questions and all were answered. The patient agreed with the plan and demonstrated an understanding of the instructions.   The patient was advised  to call back or seek an in-person evaluation if the symptoms worsen or if the condition fails to improve as anticipated. Follow up: Return in about 3 months (around 03/26/2019) for follow up on ADD, mood follow up.  Visit date not found  Meds ordered this encounter  Medications  . methylphenidate (RITALIN LA) 20 MG 24 hr capsule    Sig: Take 1 capsule (20 mg total) by mouth daily.    Dispense:  30 capsule    Refill:  0    Please hold for refill due in august.      I reviewed the patients updated PMH, FH, and SocHx.    Patient Active Problem List   Diagnosis Date Noted  . Attention deficit disorder (ADD) without hyperactivity 10/29/2018  . Moderate recurrent major depression (HCC) 08/09/2018  . Personal history of congenital hip dysplasia 03/23/2018  . History of postpartum depression 03/23/2018  . 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 Congenital subvalvular aortic stenosis -repaired 03/26/2017  . Deaf - LEFT ear 03/26/2017  . Aortic valve insufficiency 11/25/2016  . 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 Family history of Klippel-Feil syndrome 10/03/2016   Current Meds  Medication Sig  . acetaminophen (TYLENOL) 500 MG tablet Take 500 mg by mouth every 4 (four) hours as needed for mild pain or moderate pain.  Melene Muller. [START ON 01/17/2019] methylphenidate (RITALIN LA) 20 MG 24 hr capsule Take 1 capsule (20 mg total) by mouth daily.  . [DISCONTINUED] escitalopram (LEXAPRO) 10 MG tablet Take 0.5 tablets (5 mg total) by mouth daily.  . [  DISCONTINUED] methylphenidate (RITALIN LA) 20 MG 24 hr capsule Take 1 capsule (20 mg total) by mouth daily.    Allergies: Patient is allergic to codeine. Family History: Patient family history includes ADD / ADHD in her son; Anemia in her mother; Anxiety disorder in her mother and son; Cancer in her maternal grandmother; Depression in her mother; GER disease in her mother; Heart attack in her father; ODD in her son; Peripheral vascular disease in her  mother; Varicose Veins in her mother. Social History:  Patient  reports that she has never smoked. She has never used smokeless tobacco. She reports current alcohol use. She reports that she does not use drugs.  Review of Systems: Constitutional: Negative for fever malaise or anorexia Cardiovascular: negative for chest pain Respiratory: negative for SOB or persistent cough Gastrointestinal: negative for abdominal pain  OBJECTIVE Vitals: LMP 11/24/2018  General: no acute distress , A&Ox3 Psych: happy affect, normal speech  Leamon Arnt, MD

## 2019-02-04 ENCOUNTER — Other Ambulatory Visit: Payer: Self-pay | Admitting: Family Medicine

## 2019-02-07 ENCOUNTER — Other Ambulatory Visit: Payer: Self-pay | Admitting: *Deleted

## 2019-02-07 MED ORDER — METHYLPHENIDATE HCL ER (LA) 20 MG PO CP24: 20.0000 mg | ORAL_CAPSULE | Freq: Every day | ORAL | 0 refills | Status: DC

## 2019-02-07 MED FILL — METHYLPHENIDATE ER 20 MG CA: 20 | 30 days supply | Qty: 30 | Fill #0

## 2019-02-11 ENCOUNTER — Ambulatory Visit: Payer: No Typology Code available for payment source | Admitting: Cardiology

## 2019-03-21 ENCOUNTER — Other Ambulatory Visit: Payer: Self-pay | Admitting: Family Medicine

## 2019-03-21 NOTE — Telephone Encounter (Signed)
Requested medication (s) are due for refill today: Yes  Requested medication (s) are on the active medication list: Yes  Last refill:  02/07/19  Future visit scheduled: No  Notes to clinic:  Unable to refill, cannot delegate     Requested Prescriptions  Pending Prescriptions Disp Refills   methylphenidate (RITALIN LA) 20 MG 24 hr capsule 30 capsule 0    Sig: Take 1 capsule (20 mg total) by mouth daily.     Not Delegated - Psychiatry:  Stimulants/ADHD Failed - 03/21/2019  3:55 PM      Failed - This refill cannot be delegated      Failed - Urine Drug Screen completed in last 360 days.      Passed - Valid encounter within last 3 months    Recent Outpatient Visits          2 months ago Attention deficit disorder (ADD) without hyperactivity   [facility] Gerilyn Pilgrim, [place] L, MD   4 months ago Moderate recurrent major depression [facility])   [facility] [place], [place] L, MD   5 months ago Moderate recurrent major depression [facility])   [facility] Gerilyn Pilgrim, Karie Fetch, MD   6 months ago Moderate recurrent major depression [facility])   [facility] [place], Karie Fetch, MD   7 months ago Moderate recurrent major depression [facility])   [facility] [place], Karie Fetch, MD

## 2019-03-21 NOTE — Telephone Encounter (Signed)
See note

## 2019-03-21 NOTE — Telephone Encounter (Signed)
methylphenidate (RITALIN LA) 20 MG 24 hr capsule  Send to [facility]

## 2019-03-23 ENCOUNTER — Other Ambulatory Visit: Payer: Self-pay | Admitting: *Deleted

## 2019-03-23 MED ORDER — METHYLPHENIDATE HCL ER (LA) 20 MG PO CP24: 20.0000 mg | ORAL_CAPSULE | Freq: Every day | ORAL | 0 refills | Status: DC

## 2019-03-23 MED FILL — METHYLPHENIDATE ER 20 MG CA: 20 | 30 days supply | Qty: 30 | Fill #0

## 2019-05-31 ENCOUNTER — Ambulatory Visit: Payer: No Typology Code available for payment source | Attending: Internal Medicine

## 2019-05-31 DIAGNOSIS — Z20822 Contact with and (suspected) exposure to covid-19: Secondary | ICD-10-CM

## 2019-06-01 LAB — NOVEL CORONAVIRUS, NAA: SARS-CoV-2, NAA: DETECTED — AB

## 2019-06-02 ENCOUNTER — Telehealth: Payer: Self-pay | Admitting: Infectious Diseases

## 2019-06-02 ENCOUNTER — Other Ambulatory Visit: Payer: Self-pay | Admitting: Infectious Diseases

## 2019-06-02 DIAGNOSIS — U071 COVID-19: Secondary | ICD-10-CM

## 2019-06-02 NOTE — Telephone Encounter (Signed)
I connected by phone with Donny Pique on 06/02/2019 at 3:35 PM to discuss the potential use of an new treatment for mild to moderate COVID-19 viral infection in non-hospitalized patients.  This patient is a 38 y.o. female that meets the FDA criteria for Emergency Use Authorization of bamlanivimab or casirivimab\imdevimab.  Has a (+) direct SARS-CoV-2 viral test result  Has mild or moderate COVID-19   Is ? 37 years of age and weighs ? 40 kg  Is NOT hospitalized due to COVID-19  Is NOT requiring oxygen therapy or requiring an increase in baseline oxygen flow rate due to COVID-19  Is within 10 days of symptom onset  Has at least one of the high risk factor(s) for progression to severe COVID-19 and/or hospitalization as defined in EUA.  Specific high risk criteria : BMI >/= 35   Symptom onset was cough Monday 12/21. She is still feeling poorly and very tired with cough/congestion.   I have spoken and communicated the following to the patient or parent/caregiver:  1. FDA has authorized the emergency use of bamlanivimab and casirivimab\imdevimab for the treatment of mild to moderate COVID-19 in adults and pediatric patients with positive results of direct SARS-CoV-2 viral testing who are 68 years of age and older weighing at least 40 kg, and who are at high risk for progressing to severe COVID-19 and/or hospitalization.  2. The significant known and potential risks and benefits of bamlanivimab and casirivimab\imdevimab, and the extent to which such potential risks and benefits are unknown.  3. Information on available alternative treatments and the risks and benefits of those alternatives, including clinical trials.  4. Patients treated with bamlanivimab and casirivimab\imdevimab should continue to self-isolate and use infection control measures (e.g., wear mask, isolate, social distance, avoid sharing personal items, clean and disinfect "high touch" surfaces, and frequent handwashing)  according to CDC guidelines.   5. The patient or parent/caregiver has the option to accept or refuse bamlanivimab or casirivimab\imdevimab .  After reviewing this information with the patient, The patient agreed to proceed with receiving the bamlanimivab infusion and will be provided a copy of the Fact sheet prior to receiving the infusion.    Janene Madeira 06/02/2019 3:35 PM

## 2019-06-02 NOTE — Telephone Encounter (Signed)
Called to discuss with patient about Covid symptoms and the use of bamlanivimab, a monoclonal antibody infusion for those with mild to moderate Covid symptoms and at a high risk of hospitalization.  Pt is qualified for this infusion at the [facility] infusion center due to BMI>35   Message left to call back  

## 2019-06-02 NOTE — Telephone Encounter (Signed)
Called to discuss with patient about Covid symptoms and the use of bamlanivimab, a monoclonal antibody infusion for those with mild to moderate Covid symptoms and at a high risk of hospitalization.  Pt is qualified for this infusion at the [facility] infusion center due to [employer] sent to call back to clarify sx onset.

## 2019-06-02 NOTE — Progress Notes (Signed)
See phone note from 06/02/2019  

## 2019-06-06 ENCOUNTER — Ambulatory Visit (HOSPITAL_COMMUNITY)
Admission: RE | Admit: 2019-06-06 | Discharge: 2019-06-06 | Disposition: A | Discharge status: Home / Self Care | Payer: No Typology Code available for payment source | Source: Ambulatory Visit | Attending: Pulmonary Disease | Admitting: Pulmonary Disease

## 2019-06-06 DIAGNOSIS — U071 COVID-19: Secondary | ICD-10-CM | POA: Insufficient documentation

## 2019-06-06 DIAGNOSIS — Z6841 Body Mass Index (BMI) 40.0 and over, adult: Secondary | ICD-10-CM | POA: Diagnosis present

## 2019-06-06 MED ORDER — DIPHENHYDRAMINE HCL 50 MG/ML IJ SOLN: 50.0000 mg | Freq: Once | INTRAMUSCULAR | Status: DC | PRN

## 2019-06-06 MED ORDER — ONDANSETRON HCL 4 MG/2ML IJ SOLN
4.0000 mg | Freq: Once | INTRAMUSCULAR | Status: AC
  Administered 2019-06-06: 10:00:00 4 mg via INTRAVENOUS

## 2019-06-06 MED ORDER — METHYLPREDNISOLONE SODIUM SUCC 125 MG IJ SOLR: 125.0000 mg | Freq: Once | INTRAMUSCULAR | Status: DC | PRN

## 2019-06-06 MED ORDER — ONDANSETRON HCL 4 MG/2ML IJ SOLN
INTRAMUSCULAR | Status: AC
  Filled 2019-06-06: qty 2

## 2019-06-06 MED ORDER — SODIUM CHLORIDE 0.9 % IV SOLN
700.0000 mg | Freq: Once | INTRAVENOUS | Status: AC
  Administered 2019-06-06: 700 mg via INTRAVENOUS
  Filled 2019-06-06: qty 20

## 2019-06-06 MED ORDER — SODIUM CHLORIDE 0.9 % IV SOLN
INTRAVENOUS | Status: DC | PRN
  Administered 2019-06-06: 09:00:00 250 mL via INTRAVENOUS

## 2019-06-06 MED ORDER — FAMOTIDINE IN NACL 20-0.9 MG/50ML-% IV SOLN: 20.0000 mg | Freq: Once | INTRAVENOUS | Status: DC | PRN

## 2019-06-06 MED ORDER — EPINEPHRINE 0.3 MG/0.3ML IJ SOAJ: 0.3000 mg | Freq: Once | INTRAMUSCULAR | Status: DC | PRN

## 2019-06-06 MED ORDER — ALBUTEROL SULFATE HFA 108 (90 BASE) MCG/ACT IN AERS: 2.0000 | INHALATION_SPRAY | Freq: Once | RESPIRATORY_TRACT | Status: DC | PRN

## 2019-06-06 NOTE — Progress Notes (Signed)
  Diagnosis: COVID-19  Physician: Dr. Wright  Procedure: [facility]fusion [facility] Med: bamlanivimab infusion - Provided patient with bamlanimivab fact sheet for patients, parents and caregivers prior to infusion.  Complications: No immediate complications noted.  Discharge: Discharged home   [name] 06/06/2019  

## 2019-06-06 NOTE — Progress Notes (Signed)
  Diagnosis: COVID-19  Physician: Dr. Billey Chang  Procedure: [facility] Infusion Clinic Med: bamlanivimab infusion - Provided patient with bamlanimivab fact sheet for patients, parents and caregivers prior to infusion.  Complications: No immediate complications noted.  Discharge: Discharged home   [name] 06/06/2019

## 2019-06-06 NOTE — Discharge Instructions (Signed)
 COVID-19 COVID-19 is a respiratory infection that is caused by a virus called severe acute respiratory syndrome coronavirus 2 (SARS-CoV-2). The disease is also known as coronavirus disease or novel coronavirus. In some people, the virus may not cause any symptoms. In others, it may cause a serious infection. The infection can get worse quickly and can lead to complications, such as:  Pneumonia, or infection of the lungs.  Acute respiratory distress syndrome or ARDS. This is fluid build-up in the lungs.  Acute respiratory failure. This is a condition in which there is not enough oxygen passing from the lungs to the body.  Sepsis or septic shock. This is a serious bodily reaction to an infection.  Blood clotting problems.  Secondary infections due to bacteria or fungus. The virus that causes COVID-19 is contagious. This means that it can spread from person to person through droplets from coughs and sneezes (respiratory secretions). What are the causes? This illness is caused by a virus. You may catch the virus by:  Breathing in droplets from an infected person's cough or sneeze.  Touching something, like a table or a doorknob, that was exposed to the virus (contaminated) and then touching your mouth, nose, or eyes. What increases the risk? Risk for infection You are more likely to be infected with this virus if you:  Live in or travel to an area with a COVID-19 outbreak.  Come in contact with a sick person who recently traveled to an area with a COVID-19 outbreak.  Provide care for or live with a person who is infected with COVID-19. Risk for serious illness You are more likely to become seriously ill from the virus if you:  Are 65 years of age or older.  Have a long-term disease that lowers your body's ability to fight infection (immunocompromised).  Live in a nursing home or long-term care facility.  Have a long-term (chronic) disease such as: ? Chronic lung disease,  including chronic obstructive pulmonary disease or asthma ? Heart disease. ? Diabetes. ? Chronic kidney disease. ? Liver disease.  Are obese. What are the signs or symptoms? Symptoms of this condition can range from mild to severe. Symptoms may appear any time from 2 to 14 days after being exposed to the virus. They include:  A fever.  A cough.  Difficulty breathing.  Chills.  Muscle pains.  A sore throat.  Loss of taste or smell. Some people may also have stomach problems, such as nausea, vomiting, or diarrhea. Other people may not have any symptoms of COVID-19. How is this diagnosed? This condition may be diagnosed based on:  Your signs and symptoms, especially if: ? You live in an area with a COVID-19 outbreak. ? You recently traveled to or from an area where the virus is common. ? You provide care for or live with a person who was diagnosed with COVID-19.  A physical exam.  Lab tests, which may include: ? A nasal swab to take a sample of fluid from your nose. ? A throat swab to take a sample of fluid from your throat. ? A sample of mucus from your lungs (sputum). ? Blood tests.  Imaging tests, which may include, X-rays, CT scan, or ultrasound. How is this treated? At present, there is no medicine to treat COVID-19. Medicines that treat other diseases are being used on a trial basis to see if they are effective against COVID-19. Your health care provider will talk with you about ways to treat your symptoms. For   most people, the infection is mild and can be managed at home with rest, fluids, and over-the-counter medicines. Treatment for a serious infection usually takes places in a hospital intensive care unit (ICU). It may include one or more of the following treatments. These treatments are given until your symptoms improve.  Receiving fluids and medicines through an IV.  Supplemental oxygen. Extra oxygen is given through a tube in the nose, a face mask, or a  hood.  Positioning you to lie on your stomach (prone position). This makes it easier for oxygen to get into the lungs.  Continuous positive airway pressure (CPAP) or bi-level positive airway pressure (BPAP) machine. This treatment uses mild air pressure to keep the airways open. A tube that is connected to a motor delivers oxygen to the body.  Ventilator. This treatment moves air into and out of the lungs by using a tube that is placed in your windpipe.  Tracheostomy. This is a procedure to create a hole in the neck so that a breathing tube can be inserted.  Extracorporeal membrane oxygenation (ECMO). This procedure gives the lungs a chance to recover by taking over the functions of the heart and lungs. It supplies oxygen to the body and removes carbon dioxide. Follow these instructions at home: Lifestyle  If you are sick, stay home except to get medical care. Your health care provider will tell you how long to stay home. Call your health care provider before you go for medical care.  Rest at home as told by your health care provider.  Do not use any products that contain nicotine or tobacco, such as cigarettes, e-cigarettes, and chewing tobacco. If you need help quitting, ask your health care provider.  Return to your normal activities as told by your health care provider. Ask your health care provider what activities are safe for you. General instructions  Take over-the-counter and prescription medicines only as told by your health care provider.  Drink enough fluid to keep your urine pale yellow.  Keep all follow-up visits as told by your health care provider. This is important. How is this prevented?  There is no vaccine to help prevent COVID-19 infection. However, there are steps you can take to protect yourself and others from this virus. To protect yourself:   Do not travel to areas where COVID-19 is a risk. The areas where COVID-19 is reported change often. To identify  high-risk areas and travel restrictions, check the CDC travel website: wwwnc.cdc.gov/travel/notices  If you live in, or must travel to, an area where COVID-19 is a risk, take precautions to avoid infection. ? Stay away from people who are sick. ? Wash your hands often with soap and water for 20 seconds. If soap and water are not available, use an alcohol-based hand sanitizer. ? Avoid touching your mouth, face, eyes, or nose. ? Avoid going out in public, follow guidance from your state and local health authorities. ? If you must go out in public, wear a cloth face covering or face mask. ? Disinfect objects and surfaces that are frequently touched every day. This may include:  Counters and tables.  Doorknobs and light switches.  Sinks and faucets.  Electronics, such as phones, remote controls, keyboards, computers, and tablets. To protect others: If you have symptoms of COVID-19, take steps to prevent the virus from spreading to others.  If you think you have a COVID-19 infection, contact your health care provider right away. Tell your health care team that you think you   may have a COVID-19 infection.  Stay home. Leave your house only to seek medical care. Do not use public transport.  Do not travel while you are sick.  Wash your hands often with soap and water for 20 seconds. If soap and water are not available, use alcohol-based hand sanitizer.  Stay away from other members of your household. Let healthy household members care for children and pets, if possible. If you have to care for children or pets, wash your hands often and wear a mask. If possible, stay in your own room, separate from others. Use a different bathroom.  Make sure that all people in your household wash their hands well and often.  Cough or sneeze into a tissue or your sleeve or elbow. Do not cough or sneeze into your hand or into the air.  Wear a cloth face covering or face mask. Where to find more  information  Centers for Disease Control and Prevention: www.cdc.gov/coronavirus/2019-ncov/index.html  World Health Organization: www.who.int/health-topics/coronavirus Contact a health care provider if:  You live in or have traveled to an area where COVID-19 is a risk and you have symptoms of the infection.  You have had contact with someone who has COVID-19 and you have symptoms of the infection. Get help right away if:  You have trouble breathing.  You have pain or pressure in your chest.  You have confusion.  You have bluish lips and fingernails.  You have difficulty waking from sleep.  You have symptoms that get worse. These symptoms may represent a serious problem that is an emergency. Do not wait to see if the symptoms will go away. Get medical help right away. Call your local emergency services (911 in the [place] Do not drive yourself to the hospital. Let the emergency medical personnel know if you think you have COVID-19. Summary  COVID-19 is a respiratory infection that is caused by a virus. It is also known as coronavirus disease or novel coronavirus. It can cause serious infections, such as pneumonia, acute respiratory distress syndrome, acute respiratory failure, or sepsis.  The virus that causes COVID-19 is contagious. This means that it can spread from person to person through droplets from coughs and sneezes.  You are more likely to develop a serious illness if you are 65 years of age or older, have a weak immunity, live in a nursing home, or have chronic disease.  There is no medicine to treat COVID-19. Your health care provider will talk with you about ways to treat your symptoms.  Take steps to protect yourself and others from infection. Wash your hands often and disinfect objects and surfaces that are frequently touched every day. Stay away from people who are sick and wear a mask if you are sick. This information is not intended to replace advice given to you by  your health care provider. Make sure you discuss any questions you have with your health care provider. Document Released: 07/01/2018 Document Revised: 10/21/2018 Document Reviewed: 07/01/2018 Elsevier Patient Education  2020 [employer] COVID-19 Frequently Asked Questions COVID-19 (coronavirus disease) is an infection that is caused by a large family of viruses. Some viruses cause illness in people and others cause illness in animals like camels, cats, and bats. In some cases, the viruses that cause illness in animals can spread to humans. Where did the coronavirus come from? In December 2019, [place] told the World Health Organization (WHO) of several cases of lung disease (human respiratory illness). These cases were linked to an open   seafood and livestock market in the city of [place]. The link to the seafood and livestock market suggests that the virus may have spread from animals to humans. However, since that first outbreak in December, the virus has also been shown to spread from person to person. What is the name of the disease and the virus? Disease name Early on, this disease was called novel coronavirus. This is because scientists determined that the disease was caused by a new (novel) respiratory virus. The World Health Organization (WHO) has now named the disease COVID-19, or coronavirus disease. Virus name The virus that causes the disease is called severe acute respiratory syndrome coronavirus 2 (SARS-CoV-2). More information on disease and virus naming World Health Organization (WHO): www.who.int/emergencies/diseases/novel-coronavirus-2019/technical-guidance/naming-the-coronavirus-disease-(covid-2019)-and-the-virus-that-causes-it Who is at risk for complications from coronavirus disease? Some people may be at higher risk for complications from coronavirus disease. This includes older adults and people who have chronic diseases, such as heart disease, diabetes, and lung disease. If you  are at higher risk for complications, take these extra precautions:  Avoid close contact with people who are sick or have a fever or cough. Stay at least 3-6 ft (1-2 m) away from them, if possible.  Wash your hands often with soap and water for at least 20 seconds.  Avoid touching your face, mouth, nose, or eyes.  Keep supplies on hand at home, such as food, medicine, and cleaning supplies.  Stay home as much as possible.  Avoid social gatherings and travel. How does coronavirus disease spread? The virus that causes coronavirus disease spreads easily from person to person (is contagious). There are also cases of community-spread disease. This means the disease has spread to:  People who have no known contact with other infected people.  People who have not traveled to areas where there are known cases. It appears to spread from one person to another through droplets from coughing or sneezing. Can I get the virus from touching surfaces or objects? There is still a lot that we do not know about the virus that causes coronavirus disease. Scientists are basing a lot of information on what they know about similar viruses, such as:  Viruses cannot generally survive on surfaces for long. They need a human body (host) to survive.  It is more likely that the virus is spread by close contact with people who are sick (direct contact), such as through: ? Shaking hands or hugging. ? Breathing in respiratory droplets that travel through the air. This can happen when an infected person coughs or sneezes on or near other people.  It is less likely that the virus is spread when a person touches a surface or object that has the virus on it (indirect contact). The virus may be able to enter the body if the person touches a surface or object and then touches his or her face, eyes, nose, or mouth. Can a person spread the virus without having symptoms of the disease? It may be possible for the virus to  spread before a person has symptoms of the disease, but this is most likely not the main way the virus is spreading. It is more likely for the virus to spread by being in close contact with people who are sick and breathing in the respiratory droplets of a sick person's cough or sneeze. What are the symptoms of coronavirus disease? Symptoms vary from person to person and can range from mild to severe. Symptoms may include:  Fever.  Cough.  Tiredness, weakness, or   fatigue.  Fast breathing or feeling short of breath. These symptoms can appear anywhere from 2 to 14 days after you have been exposed to the virus. If you develop symptoms, call your health care provider. People with severe symptoms may need hospital care. If I am exposed to the virus, how long does it take before symptoms start? Symptoms of coronavirus disease may appear anywhere from 2 to 14 days after a person has been exposed to the virus. If you develop symptoms, call your health care provider. Should I be tested for this virus? Your health care provider will decide whether to test you based on your symptoms, history of exposure, and your risk factors. How does a health care provider test for this virus? Health care providers will collect samples to send for testing. Samples may include:  Taking a swab of fluid from the nose.  Taking fluid from the lungs by having you cough up mucus (sputum) into a sterile cup.  Taking a blood sample.  Taking a stool or urine sample. Is there a treatment or vaccine for this virus? Currently, there is no vaccine to prevent coronavirus disease. Also, there are no medicines like antibiotics or antivirals to treat the virus. A person who becomes sick is given supportive care, which means rest and fluids. A person may also relieve his or her symptoms by using over-the-counter medicines that treat sneezing, coughing, and runny nose. These are the same medicines that a person takes for the common  cold. If you develop symptoms, call your health care provider. People with severe symptoms may need hospital care. What can I do to protect myself and my family from this virus?     You can protect yourself and your family by taking the same actions that you would take to prevent the spread of other viruses. Take the following actions:  Wash your hands often with soap and water for at least 20 seconds. If soap and water are not available, use alcohol-based hand sanitizer.  Avoid touching your face, mouth, nose, or eyes.  Cough or sneeze into a tissue, sleeve, or elbow. Do not cough or sneeze into your hand or the air. ? If you cough or sneeze into a tissue, throw it away immediately and wash your hands.  Disinfect objects and surfaces that you frequently touch every day.  Avoid close contact with people who are sick or have a fever or cough. Stay at least 3-6 ft (1-2 m) away from them, if possible.  Stay home if you are sick, except to get medical care. Call your health care provider before you get medical care.  Make sure your vaccines are up to date. Ask your health care provider what vaccines you need. What should I do if I need to travel? Follow travel recommendations from your local health authority, the CDC, and WHO. Travel information and advice  Centers for Disease Control and Prevention (CDC): www.cdc.gov/coronavirus/2019-ncov/travelers/index.html  World Health Organization (WHO): www.who.int/emergencies/diseases/novel-coronavirus-2019/travel-advice Know the risks and take action to protect your health  You are at higher risk of getting coronavirus disease if you are traveling to areas with an outbreak or if you are exposed to travelers from areas with an outbreak.  Wash your hands often and practice good hygiene to lower the risk of catching or spreading the virus. What should I do if I am sick? General instructions to stop the spread of infection  Wash your hands often  with soap and water for at least 20 seconds. If   soap and water are not available, use alcohol-based hand sanitizer.  Cough or sneeze into a tissue, sleeve, or elbow. Do not cough or sneeze into your hand or the air.  If you cough or sneeze into a tissue, throw it away immediately and wash your hands.  Stay home unless you must get medical care. Call your health care provider or local health authority before you get medical care.  Avoid public areas. Do not take public transportation, if possible.  If you can, wear a mask if you must go out of the house or if you are in close contact with someone who is not sick. Keep your home clean  Disinfect objects and surfaces that are frequently touched every day. This may include: ? Counters and tables. ? Doorknobs and light switches. ? Sinks and faucets. ? Electronics such as phones, remote controls, keyboards, computers, and tablets.  Wash dishes in hot, soapy water or use a dishwasher. Air-dry your dishes.  Wash laundry in hot water. Prevent infecting other household members  Let healthy household members care for children and pets, if possible. If you have to care for children or pets, wash your hands often and wear a mask.  Sleep in a different bedroom or bed, if possible.  Do not share personal items, such as razors, toothbrushes, deodorant, combs, brushes, towels, and washcloths. Where to find more information Centers for Disease Control and Prevention (CDC)  Information and news updates: www.cdc.gov/coronavirus/2019-ncov World Health Organization (WHO)  Information and news updates: www.who.int/emergencies/diseases/novel-coronavirus-2019  Coronavirus health topic: www.who.int/health-topics/coronavirus  Questions and answers on COVID-19: www.who.int/news-room/q-a-detail/q-a-coronaviruses  Global tracker: who.sprinklr.com American Academy of Pediatrics (AAP)  Information for families:  www.healthychildren.org/English/health-issues/conditions/chest-lungs/Pages/2019-Novel-Coronavirus.aspx The coronavirus situation is changing rapidly. Check your local health authority website or the CDC and WHO websites for updates and news. When should I contact a health care provider?  Contact your health care provider if you have symptoms of an infection, such as fever or cough, and you: ? Have been near anyone who is known to have coronavirus disease. ? Have come into contact with a person who is suspected to have coronavirus disease. ? Have traveled outside of the country. When should I get emergency medical care?  Get help right away by calling your local emergency services (911 in the [place] if you have: ? Trouble breathing. ? Pain or pressure in your chest. ? Confusion. ? Blue-tinged lips and fingernails. ? Difficulty waking from sleep. ? Symptoms that get worse. Let the emergency medical personnel know if you think you have coronavirus disease. Summary  A new respiratory virus is spreading from person to person and causing COVID-19 (coronavirus disease).  The virus that causes COVID-19 appears to spread easily. It spreads from one person to another through droplets from coughing or sneezing.  Older adults and those with chronic diseases are at higher risk of disease. If you are at higher risk for complications, take extra precautions.  There is currently no vaccine to prevent coronavirus disease. There are no medicines, such as antibiotics or antivirals, to treat the virus.  You can protect yourself and your family by washing your hands often, avoiding touching your face, and covering your coughs and sneezes. This information is not intended to replace advice given to you by your health care provider. Make sure you discuss any questions you have with your health care provider. Document Released: 09/21/2018 Document Revised: 09/21/2018 Document Reviewed: 09/21/2018 Elsevier  Patient Education  2020 [employer] Inc.  

## 2019-06-07 ENCOUNTER — Ambulatory Visit: Payer: Self-pay | Admitting: *Deleted

## 2019-06-07 NOTE — Telephone Encounter (Signed)
See note

## 2019-06-07 NOTE — Telephone Encounter (Signed)
FYI-patient Infusion clinic for further advisement/instructions

## 2019-06-07 NOTE — Telephone Encounter (Signed)
Message from [name] sent at 06/07/2019 3:27 PM EST  Summary: Clinical Advice   Patient would like to speak with nurse in regards to headache,pressure in head, bodyaches and joint pain that she feels came from infusion that she got yesterday. Please advise          Pt has concerns about symptoms she is experiencing after receiving infusion for Covid-19. Infusion Clinic Hotline number given to discuss symptoms with infusion clinic staff member. Reason for Disposition . [1] Caller has URGENT medication question about med that PCP or specialist prescribed AND [2] triager unable to answer question  Answer Assessment - Initial Assessment Questions 1. REASON FOR CALL or QUESTION: "What is your reason for calling today?" or "How can I best help you?" or "What question do you have that I can help answer?"     Patient called in to inquire about symptoms she feels came from Infusion Therapy she received yesterday.  Protocols used: MEDICATION QUESTION CALL-A-AH, INFORMATION ONLY CALL - NO TRIAGE-A-AH

## 2019-06-08 ENCOUNTER — Telehealth: Payer: Self-pay | Admitting: Infectious Diseases

## 2019-06-08 NOTE — Telephone Encounter (Signed)
Adverse Reaction report completed via FDA MedWatch.

## 2019-06-08 NOTE — Telephone Encounter (Signed)
Called her and feels better today. She had a lot of muscle pain/joint pain 24 hours following infusion and increased brain fog.   Her husband has also been experiencing the same symptoms including brain fog and he is awaiting covid test. He started sx 12/24 and likely infected as well. I told her today it may not be possible to arrange infusion for him [employer]) but if he turns up positive we can certainly try for a cancellation spot thursday.   I suspect her symptoms are more due to covid (esp brain fog concern) vs anything related to infusion. Body aches could have been side effect from bamlanivimab but better today. She appreciated my call to check in.

## 2019-06-09 ENCOUNTER — Encounter: Payer: Self-pay | Admitting: *Deleted

## 2019-06-09 NOTE — Telephone Encounter (Signed)
  This encounter was created in error - please disregard.  Jeraldine Primeau, RN 

## 2019-07-07 ENCOUNTER — Encounter: Payer: Self-pay | Admitting: Emergency Medicine

## 2019-07-07 ENCOUNTER — Other Ambulatory Visit: Payer: Self-pay

## 2019-07-07 ENCOUNTER — Emergency Department
Admission: EM | Admit: 2019-07-07 | Discharge: 2019-07-07 | Disposition: A | Discharge status: Home / Self Care | Payer: No Typology Code available for payment source | Source: Home / Self Care

## 2019-07-07 DIAGNOSIS — S0501XA Injury of conjunctiva and corneal abrasion without foreign body, right eye, initial encounter: Secondary | ICD-10-CM | POA: Diagnosis not present

## 2019-07-07 MED ORDER — TOBRAMYCIN 0.3 % OP SOLN: 1.0000 [drp] | OPHTHALMIC | 0 refills | Status: DC

## 2019-07-07 MED ORDER — TOBRAMYCIN 0.3 % OP SOLN: 1.0000 [drp] | OPHTHALMIC | 0 refills | Status: AC
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
## 2019-07-07 NOTE — ED Provider Notes (Signed)
[facility]    CSN: [id number] Arrival date & time: 07/07/19  1923      History   Chief Complaint Chief Complaint  Patient presents with  . Eye Injury    HPI [name] is a 39 y.o. female.   Pt reports she hit herself in the eye with a zip tie.  Pt complains of redness and eye pain  The history is provided by the patient. No language interpreter was used.  Eye Injury This is a new problem. The problem occurs constantly. The problem has not changed since onset.Pertinent negatives include no chest pain. Nothing aggravates the symptoms. Nothing relieves the symptoms. She has tried nothing for the symptoms. The treatment provided no relief.    Past Medical History:  Diagnosis Date  . Anemia   . Anti-Duffy antibodies present   . Deafness    LT ear since birth   . Deafness in left ear   . Depression   . ELECTROCARDIOGRAM, ABNORMAL 12/28/2008   Qualifier: Diagnosis of  By: Johnsie Cancel, MD, Rona Ravens   . Fractured coccyx ([place])   . GERD (gastroesophageal reflux disease)   . Heart murmur   . History of postpartum depression 03/23/2018  . Hx of varicella   . Hypertension   . Personal history of congenital hip dysplasia 03/23/2018   Treated with Pavloc harness  . Subaortic stenosis    repaired    Patient Active Problem List   Diagnosis Date Noted  . Attention deficit disorder (ADD) without hyperactivity 10/29/2018  . Moderate recurrent major depression ([place]) 08/09/2018  . Personal history of congenital hip dysplasia 03/23/2018  . History of postpartum depression 03/23/2018  . 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 Congenital subvalvular aortic stenosis -repaired 03/26/2017  . Deaf - LEFT ear 03/26/2017  . Aortic valve insufficiency 11/25/2016  . 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 Family history of Klippel-Feil syndrome 10/03/2016    Past Surgical History:  Procedure Laterality Date  . CARDIAC SURGERY     open heart surgery   .  CESAREAN SECTION    . CESAREAN SECTION N/A 07/28/2012   Procedure: CESAREAN SECTION;  Surgeon: Lovenia Kim, MD;  Location: [facility];  Service: Obstetrics;  Laterality: N/A;  Repeat C/S  EDD: 07/20/12  . CESAREAN SECTION N/A 03/26/2017   Procedure: Repeat CESAREAN SECTION;  Surgeon: Brien Few, MD;  Location: [facility];  Service: Obstetrics;  Laterality: N/A;  EDD: 03/27/17 Allergy: Codeine  . EYE SURGERY      OB History    Gravida  4   Para  3   Term  3   Preterm      AB  1   Living  3     SAB  1   TAB      Ectopic      Multiple  0   Live Births  3            Home Medications    Prior to Admission medications   Medication Sig Start Date End Date Taking? Authorizing Provider  acetaminophen (TYLENOL) 500 MG tablet Take 500 mg by mouth every 4 (four) hours as needed for mild pain or moderate pain.   Yes [provider]  methylphenidate (RITALIN LA) 20 MG 24 hr capsule Take 1 capsule (20 mg total) by mouth daily. 03/23/19   Leamon Arnt, MD    Family History Family History  Problem Relation Age of Onset  . Peripheral vascular disease Mother   .  Varicose Veins Mother   . Anemia Mother   . Depression Mother   . Anxiety disorder Mother   . GER disease Mother   . Cancer Maternal Grandmother        breast  . Heart attack Father   . Anxiety disorder Son   . ODD Son   . ADD / ADHD Son     Social History Social History   Tobacco Use  . Smoking status: Never Smoker  . Smokeless tobacco: Never Used  Substance Use Topics  . Alcohol use: Yes    Comment: 1 q mth  . Drug use: No     Allergies   Codeine   Review of Systems Review of Systems  Cardiovascular: Negative for chest pain.  All other systems reviewed and are negative.    Physical Exam Triage Vital Signs ED Triage Vitals  Enc Vitals Group     BP 07/07/19 1939 114/70     Pulse Rate 07/07/19 1939 100     Resp --      Temp 07/07/19 1939 98.8 F (37.1 C)      Temp Source 07/07/19 1939 Oral     SpO2 07/07/19 1939 100 %     Weight 07/07/19 1940 284 lb 12 oz (129.2 kg)     Height 07/07/19 1940 5\' 3"  (1.6 m)     Head Circumference --      Peak Flow --      Pain Score 07/07/19 1939 5     Pain Loc --      Pain Edu? --      Excl. in GC? --    No data found.  Updated Vital Signs BP 114/70 (BP Location: Right Arm)   Pulse 100   Temp 98.8 F (37.1 C) (Oral)   Ht 5\' 3"  (1.6 m)   Wt 129.2 kg   LMP 06/17/2019   SpO2 100%   BMI 50.44 kg/m   Visual Acuity Right Eye Distance:   Left Eye Distance:   Bilateral Distance:    Right Eye Near:   Left Eye Near:    Bilateral Near:     Physical Exam Vitals and nursing note reviewed.  Constitutional:      Appearance: She is well-developed.  HENT:     Head: Normocephalic.  Eyes:     Pupils: Pupils are equal, round, and reactive to light.     Comments: Injected right eye,  Fluro, thin line of uptake,    Pulmonary:     Effort: Pulmonary effort is normal.  Abdominal:     General: There is no distension.  Musculoskeletal:        General: Normal range of motion.     Cervical back: Normal range of motion.  Neurological:     Mental Status: She is alert and oriented to person, place, and time.      UC Treatments / Results  Labs (all labs ordered are listed, but only abnormal results are displayed) Labs Reviewed - No data to display  EKG   Radiology No results found.  Procedures Procedures (including critical care time)  Medications Ordered in UC Medications - No data to display  Initial Impression / Assessment and Plan / UC Course  I have reviewed the triage vital signs and the nursing notes.  Pertinent labs & imaging results that were available during my care of the patient were reviewed by me and considered in my medical decision making (see chart for details).  MDM:  Pt counseled on abrasion.  Rx for tobrex.  Final Clinical Impressions(s) / UC Diagnoses   Final  diagnoses:  Abrasion of right cornea, initial encounter   Discharge Instructions   None    ED Prescriptions    None     PDMP not reviewed this encounter.  An After Visit Summary was printed and given to the patient.    Elson Areas, [place] 07/07/19 [phone]

## 2019-07-07 NOTE — Discharge Instructions (Addendum)
Return if any problems. See your eye doctor for recheck

## 2019-11-02 ENCOUNTER — Encounter: Payer: Self-pay | Admitting: Internal Medicine

## 2019-11-02 ENCOUNTER — Other Ambulatory Visit: Payer: Self-pay

## 2019-11-02 ENCOUNTER — Ambulatory Visit (INDEPENDENT_AMBULATORY_CARE_PROVIDER_SITE_OTHER): Payer: No Typology Code available for payment source | Admitting: Internal Medicine

## 2019-11-02 VITALS — BP 112/64 | HR 72 | Temp 98.0°F | Resp 16 | Ht 63.0 in | Wt 248.0 lb

## 2019-11-02 DIAGNOSIS — R1010 Upper abdominal pain, unspecified: Secondary | ICD-10-CM | POA: Diagnosis not present

## 2019-11-02 LAB — COMPREHENSIVE METABOLIC PANEL
ALT: 20 U/L (ref 0–35)
AST: 16 U/L (ref 0–37)
Albumin: 4 g/dL (ref 3.5–5.2)
Alkaline Phosphatase: 68 U/L (ref 39–117)
BUN: 14 mg/dL (ref 6–23)
CO2: 29 mEq/L (ref 19–32)
Calcium: 8.8 mg/dL (ref 8.4–10.5)
Chloride: 102 mEq/L (ref 96–112)
Creatinine, Ser: 0.76 mg/dL (ref 0.40–1.20)
GFR: 85.43 mL/min (ref >60.00)
Glucose, Bld: 99 mg/dL (ref 70–99)
Potassium: 4 mEq/L (ref 3.5–5.1)
Sodium: 137 mEq/L (ref 135–145)
Total Bilirubin: 0.4 mg/dL (ref 0.2–1.2)
Total Protein: 6.9 g/dL (ref 6.0–8.3)

## 2019-11-02 LAB — CBC WITH DIFFERENTIAL/PLATELET
Basophils Absolute: 0 10*3/uL (ref 0.0–0.1)
Basophils Relative: 0.6 % (ref 0.0–3.0)
Eosinophils Absolute: 0.1 10*3/uL (ref 0.0–0.7)
Eosinophils Relative: 0.7 % (ref 0.0–5.0)
HCT: 34.4 % — ABNORMAL LOW (ref 36.0–46.0)
Hemoglobin: 11.2 g/dL — ABNORMAL LOW (ref 12.0–15.0)
Lymphocytes Relative: 26.8 % (ref 12.0–46.0)
Lymphs Abs: 2.2 10*3/uL (ref 0.7–4.0)
MCHC: 32.7 g/dL (ref 30.0–36.0)
MCV: 82.8 fl (ref 78.0–100.0)
Monocytes Absolute: 0.5 10*3/uL (ref 0.1–1.0)
Monocytes Relative: 5.6 % (ref 3.0–12.0)
Neutro Abs: 5.5 10*3/uL (ref 1.4–7.7)
Neutrophils Relative %: 66.3 % (ref 43.0–77.0)
Platelets: 213 10*3/uL (ref 150.0–400.0)
RBC: 4.16 Mil/uL (ref 3.87–5.11)
RDW: 15.8 % — ABNORMAL HIGH (ref 11.5–15.5)
WBC: 8.2 10*3/uL (ref 4.0–10.5)

## 2019-11-02 NOTE — Assessment & Plan Note (Signed)
Acute Severe pain earlier today associated with nausea Know h/o of cholelithiasis Concern for cholecystitis, symptomatic cholelithiasis, less likely related to umbilical hernia Abdomen is not acute so need to go to the hospital Stat cbc, cmp [place] of Abdomen stat If pain worsens stressed she needs to go to the ED

## 2019-11-02 NOTE — Progress Notes (Signed)
Subjective:    Patient ID: [name], female    DOB: 1981/05/22, 39 y.o.   MRN: [id number]  HPI The patient is here for an acute visit.  Last night she started feeling like her stomach had a pulled muscle.  It started about a palm above her umbilicus and went to the left side.  This am the pain was severe and she was nauseous.  As time went on it got better.  She has eaten today and that helped a little.    2/1/2 years ago her umbilicus popped out - never went back - she was worried about a hernia  Right now her pain is 2-3 /10.  No nausea  No unusual food.  No prior episodes  [place] of Abd: 2018:  IMPRESSION: Numerous gravel-like gallstones without other evidence of acute cholecystitis.   Medications and allergies reviewed with patient and updated if appropriate.  Patient Active Problem List   Diagnosis Date Noted  . Attention deficit disorder (ADD) without hyperactivity 10/29/2018  . Moderate recurrent major depression ([place]) 08/09/2018  . Personal history of congenital hip dysplasia 03/23/2018  . History of postpartum depression 03/23/2018  . 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 Congenital subvalvular aortic stenosis -repaired 03/26/2017  . Deaf - LEFT ear 03/26/2017  . Aortic valve insufficiency 11/25/2016  . 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 Family history of Klippel-Feil syndrome 10/03/2016    Current Outpatient Medications on File Prior to Visit  Medication Sig Dispense Refill  . acetaminophen (TYLENOL) 500 MG tablet Take 500 mg by mouth every 4 (four) hours as needed for mild pain or moderate pain.    . methylphenidate (RITALIN LA) 20 MG 24 hr capsule Take 1 capsule (20 mg total) by mouth daily. 30 capsule 0   No current facility-administered medications on file prior to visit.    Past Medical History:  Diagnosis Date  . Anemia   . Anti-Duffy antibodies present   . Deafness    LT ear since birth   . Deafness in left ear   . Depression   .  ELECTROCARDIOGRAM, ABNORMAL 12/28/2008   Qualifier: Diagnosis of  By: Johnsie Cancel, MD, Rona Ravens   . Fractured coccyx ([place])   . GERD (gastroesophageal reflux disease)   . Heart murmur   . History of postpartum depression 03/23/2018  . Hx of varicella   . Hypertension   . Personal history of congenital hip dysplasia 03/23/2018   Treated with Pavloc harness  . Subaortic stenosis    repaired    Past Surgical History:  Procedure Laterality Date  . CARDIAC SURGERY     open heart surgery   . CESAREAN SECTION    . CESAREAN SECTION N/A 07/28/2012   Procedure: CESAREAN SECTION;  Surgeon: Lovenia Kim, MD;  Location: [facility];  Service: Obstetrics;  Laterality: N/A;  Repeat C/S  EDD: 07/20/12  . CESAREAN SECTION N/A 03/26/2017   Procedure: Repeat CESAREAN SECTION;  Surgeon: Brien Few, MD;  Location: [facility];  Service: Obstetrics;  Laterality: N/A;  EDD: 03/27/17 Allergy: Codeine  . EYE SURGERY      Social History   Socioeconomic History  . Marital status: Married    Spouse name: jeff  . Number of children: 3  . Years of education: Not on file  . Highest education level: Not on file  Occupational History  . Not on file  Tobacco Use  . Smoking status: Never Smoker  . Smokeless tobacco: Never Used  Substance and Sexual Activity  . Alcohol use: Yes    Comment: 1 q mth  . Drug use: No  . Sexual activity: Yes    Birth control/protection: Condom  Other Topics Concern  . Not on file  Social History Narrative  . Not on file   Social Determinants of Health   Financial Resource Strain:   . Difficulty of Paying Living Expenses:   Food Insecurity:   . Worried About Programme researcher, broadcasting/film/video in the Last Year:   . Barista in the Last Year:   Transportation Needs:   . Freight forwarder (Medical):   Marland Kitchen Lack of Transportation (Non-Medical):   Physical Activity:   . Days of Exercise per Week:   . Minutes of Exercise per Session:   Stress:   . Feeling of  Stress :   Social Connections:   . Frequency of Communication with Friends and Family:   . Frequency of Social Gatherings with Friends and Family:   . Attends Religious Services:   . Active Member of Clubs or Organizations:   . Attends Banker Meetings:   Marland Kitchen Marital Status:     Family History  Problem Relation Age of Onset  . Peripheral vascular disease Mother   . Varicose Veins Mother   . Anemia Mother   . Depression Mother   . Anxiety disorder Mother   . GER disease Mother   . Cancer Maternal Grandmother        breast  . Heart attack Father   . Anxiety disorder Son   . ODD Son   . ADD / ADHD Son     Review of Systems  Constitutional: Negative for chills and fever.  Gastrointestinal: Positive for abdominal pain and nausea. Negative for blood in stool, constipation, diarrhea and vomiting.       No gerd in past two days       Objective:   Vitals:   11/02/19 1506  BP: 112/64  Pulse: 72  Resp: 16  Temp: 98 F (36.7 C)  SpO2: 98%   BP Readings from Last 3 Encounters:  11/02/19 112/64  07/07/19 114/70  06/06/19 93/65   Wt Readings from Last 3 Encounters:  11/02/19 248 lb (112.5 kg)  07/07/19 284 lb 12 oz (129.2 kg)  10/29/18 275 lb (124.7 kg)   Body mass index is 43.93 kg/m.   Physical Exam Constitutional:      General: She is not in acute distress.    Appearance: She is well-developed. She is obese. She is not ill-appearing.  HENT:     Head: Normocephalic and atraumatic.  Abdominal:     General: Abdomen is protuberant.     Palpations: Abdomen is soft.     Tenderness: There is abdominal tenderness in the right upper quadrant, epigastric area, periumbilical area and left upper quadrant. There is no guarding or rebound. Positive signs include Murphy's sign (inc pain in RUQ).     Hernia: A hernia is present. Hernia is present in the umbilical area (no erythema, mild tenderness). There is no hernia in the ventral area.  Skin:    General: Skin is  warm and dry.     Findings: No erythema.  Neurological:     Mental Status: She is alert.            Assessment & Plan:    See Problem List for Assessment and Plan of chronic medical problems.    This visit occurred during the  SARS-CoV-2 public health emergency.  Safety protocols were in place, including screening questions prior to the visit, additional usage of staff PPE, and extensive cleaning of exam room while observing appropriate contact time as indicated for disinfecting solutions.

## 2019-11-02 NOTE — Patient Instructions (Addendum)
  Blood work was ordered.     Medications reviewed and updated.  Changes include :   none   An Ultrasound of your abdomen was ordered.

## 2019-11-03 ENCOUNTER — Encounter: Payer: Self-pay | Admitting: Internal Medicine

## 2019-11-03 ENCOUNTER — Ambulatory Visit (HOSPITAL_COMMUNITY)
Admission: RE | Admit: 2019-11-03 | Discharge: 2019-11-03 | Disposition: A | Discharge status: Home / Self Care | Payer: No Typology Code available for payment source | Source: Ambulatory Visit | Attending: Internal Medicine | Admitting: Internal Medicine

## 2019-11-03 DIAGNOSIS — R1010 Upper abdominal pain, unspecified: Secondary | ICD-10-CM | POA: Diagnosis present

## 2019-11-03 DIAGNOSIS — K8020 Calculus of gallbladder without cholecystitis without obstruction: Secondary | ICD-10-CM | POA: Insufficient documentation

## 2020-02-10 ENCOUNTER — Other Ambulatory Visit: Payer: Self-pay

## 2020-02-10 ENCOUNTER — Encounter: Payer: Self-pay | Admitting: Family Medicine

## 2020-02-10 ENCOUNTER — Other Ambulatory Visit: Payer: Self-pay | Admitting: Family Medicine

## 2020-02-10 ENCOUNTER — Ambulatory Visit (INDEPENDENT_AMBULATORY_CARE_PROVIDER_SITE_OTHER): Payer: No Typology Code available for payment source | Admitting: Family Medicine

## 2020-02-10 VITALS — BP 122/70 | HR 56 | Temp 97.9°F | Resp 18 | Ht 63.0 in | Wt 231.4 lb

## 2020-02-10 DIAGNOSIS — F988 Other specified behavioral and emotional disorders with onset usually occurring in childhood and adolescence: Secondary | ICD-10-CM | POA: Diagnosis not present

## 2020-02-10 DIAGNOSIS — F331 Major depressive disorder, recurrent, moderate: Secondary | ICD-10-CM

## 2020-02-10 DIAGNOSIS — F430 Acute stress reaction: Secondary | ICD-10-CM

## 2020-02-10 MED ORDER — METHYLPHENIDATE HCL ER (LA) 20 MG PO CP24: 20.0000 mg | ORAL_CAPSULE | Freq: Every day | ORAL | 0 refills | Status: DC

## 2020-02-10 MED ORDER — ALPRAZOLAM 0.5 MG PO TABS: 0.5000 mg | ORAL_TABLET | Freq: Every day | ORAL | 0 refills | Status: DC | PRN

## 2020-02-10 MED ORDER — ESCITALOPRAM OXALATE 10 MG PO TABS: 10.0000 mg | ORAL_TABLET | Freq: Every day | ORAL | 2 refills | Status: DC

## 2020-02-10 MED FILL — ESCITALOPRAM 10 MG TABLET: 10 | 30 days supply | Qty: 30 | Fill #0

## 2020-02-10 MED FILL — METHYLPHENIDATE HCL ER (LA): 20 | 30 days supply | Qty: 30 | Fill #0

## 2020-02-10 MED FILL — ALPRAZolam 0.5 MG TABS: 0.5 | 30 days supply | Qty: 30 | Fill #0

## 2020-02-10 NOTE — Progress Notes (Signed)
Subjective  CC:  Chief Complaint  Patient presents with   Anxiety and Depression    In the last 6 months her social anxety has gotten worse. Her depression has gotten worse recently due to her current situation. She has been crying alot.   ADD    She has been focusing on things that don't matter at all. She stated that she will research a subject for hours instead of doing what she needs to do. She stated that it feels like her "wheels are stuck in the mud" and she cant get out.   Health Maintenance    Patient would like to do her pap and flu shot at her next office visit     HPI: [name] is a 39 y.o. female who presents to the office today to address the problems listed above in the chief complaint, mood problems.  [name] presents due to worsening anxiety depression ADD symptoms.  I reviewed our notes from last year.  He had multiple visits due to worsening depression anxiety and ADD.  She is finally well controlled on Lexapro and ritalin la.  She stopped Lexapro in July since her symptoms have been stable.  We did discuss the rare possibility of relapse and to restart medications if that happens.  She ran out of her ADD medicines.  She reports that she did well until about October or November 2020 when she had a decision to extend her treatment to school.  The Covid pandemic has been very difficult for her.  Her children and her family all contracted Covid earlier this year.  Fortunately everyone has recovered well.  She and her husband are vaccinated.  Her mood has progressively declined.  She now is anxious, having panic symptoms and is very depressed.  No suicidal ideation.  She also has the added stress of her mother and father being sick with Covid right now, both are unvaccinated.  Her father was admitted to the hospital last night.  She knows she has not been doing well but has been trying to take care of everyone else.  Health maintenance: She is overdue for complete  physical.  She defers a flu shot for now.  Depression screen [facility] 2/9 02/10/2020 10/29/2018 10/01/2018  Decreased Interest 2 2 1   Down, Depressed, Hopeless 1 1 1   PHQ - 2 Score 3 3 2   Altered sleeping 0 1 2  Tired, decreased energy 1 1 3   Change in appetite 1 0 1  Feeling bad or failure about yourself  2 0 1  Trouble concentrating 3 0 3  Moving slowly or fidgety/restless 1 1 0  Suicidal thoughts 0 0 0  PHQ-9 Score 11 6 12   Difficult doing work/chores Very difficult Not difficult at all Not difficult at all   GAD 7 : Generalized Anxiety Score 02/10/2020 06/23/2018  Nervous, Anxious, on Edge 3 2  Control/stop worrying 3 2  Worry too much - different things 3 2  Trouble relaxing 2 2  Restless 0 0  Easily annoyed or irritable 2 3  Afraid - awful might happen 3 1  Total GAD 7 Score 16 12  Anxiety Difficulty Very difficult Very difficult    Assessment  1. Attention deficit disorder (ADD) without hyperactivity   2. Moderate recurrent major depression (HCC)   3. Stress reaction      Plan   Depression/anxiety: Moderate to severe symptoms.  Counseling and education done.  Need to restart medications.  Restart Lexapro 10 and  add Xanax for as needed use.  Close follow-up.  ADD: Restart stimulants.  Discussed keeping regular care to monitor her symptoms.   Reviewed concept of mood problems caused by biochemical imbalance of neurotransmitters and rationale for treatment with medications and therapy.   Counseling given: pt was instructed to contact office, on-call physician or crisis Hotline if symptoms worsen significantly. If patient develops any suicidal or homicidal thoughts, she is directed to the ER immediately.   Follow up: 6 weeks for mood recheck No orders of the defined types were placed in this encounter.  Meds ordered this encounter  Medications   escitalopram (LEXAPRO) 10 MG tablet    Sig: Take 1 tablet (10 mg total) by mouth daily.    Dispense:  30 tablet    Refill:  2     methylphenidate (RITALIN LA) 20 MG 24 hr capsule    Sig: Take 1 capsule (20 mg total) by mouth daily.    Dispense:  30 capsule    Refill:  0   ALPRAZolam (XANAX) 0.5 MG tablet    Sig: Take 1 tablet (0.5 mg total) by mouth daily as needed for anxiety.    Dispense:  30 tablet    Refill:  0      I reviewed the patients updated PMH, FH, and SocHx.    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    Allergies: Patient is allergic to codeine. Family history:  Patient family history includes ADD / ADHD in her son; Anemia in her mother; Anxiety disorder in her mother and son; Cancer in her maternal grandmother; Depression in her mother; GER disease in her mother; Heart attack in her father; ODD in her son; Peripheral vascular disease in her mother; Varicose Veins in her mother. Social History   Socioeconomic History   Marital status: Married    Spouse name: jeff   Number of children: 3   Years of education: Not on file   Highest education level: Not on file  Occupational History   Not on file  Tobacco Use   Smoking status: Never Smoker   Smokeless tobacco: Never Used  Vaping Use   Vaping Use: Never used  Substance and Sexual Activity   Alcohol use: Yes    Comment: 1 q mth   Drug use: No   Sexual activity: Yes    Birth control/protection: Condom  Other Topics Concern    Not on file  Social History Narrative   Not on file   Social Determinants of Health   Financial Resource Strain:    Difficulty of Paying Living Expenses: Not on file  Food Insecurity:    Worried About Programme researcher, broadcasting/film/video in the Last Year: Not on file   [employer] of Food in the Last Year: Not on file  Transportation Needs:    Lack of Transportation (Medical): Not on file   Lack of Transportation (Non-Medical): Not on file  Physical Activity:  Days of Exercise per Week: Not on file   Minutes of Exercise per Session: Not on file  Stress:    Feeling of Stress : Not on file  Social Connections:    Frequency of Communication with Friends and Family: Not on file   Frequency of Social Gatherings with Friends and Family: Not on file   Attends Religious Services: Not on file   Active Member of Clubs or Organizations: Not on file   Attends Banker Meetings: Not on file   Marital Status: Not on file     Review of Systems: Constitutional: Negative for fever malaise or anorexia Cardiovascular: negative for chest pain Respiratory: negative for SOB or persistent cough Gastrointestinal: negative for abdominal pain  Objective  Vitals: BP 122/70    Pulse (!) 56    Temp 97.9 F (36.6 C) (Temporal)    Resp 18    Ht 5\' 3"  (1.6 m)    Wt 231 lb 6.4 oz (105 kg)    SpO2 99%    BMI 40.99 kg/m  General: no acute distress, well appearing, no apparent distress, well groomed Psych:  Alert and oriented x 3,depressed. depressed affect tearful, good insight    Commons side effects, risks, benefits, and alternatives for medications and treatment plan prescribed today were discussed, and the patient expressed understanding of the given instructions. Patient is instructed to call or message via MyChart if he/she has any questions or concerns regarding our treatment plan. No barriers to understanding were identified. We discussed Red Flag symptoms and signs in detail. Patient  expressed understanding regarding what to do in case of urgent or emergency type symptoms.   Medication list was reconciled, printed and provided to the patient in AVS. Patient instructions and summary information was reviewed with the patient as documented in the AVS. This note was prepared with assistance of Dragon voice recognition software. Occasional wrong-word or sound-a-like substitutions may have occurred due to the inherent limitations of voice recognition software

## 2020-02-10 NOTE — Patient Instructions (Signed)
Please return in 6 weeks for recheck.   Restart medications as prescribed.  Send a refill request for ADD med in 4 weeks if helpful.   Take care of yourself! And I'm hoping your father and mother recover well.   If you have any questions or concerns, please don't hesitate to send me a message via MyChart or call the office at [phone]. Thank you for visiting with [place] today! It's our pleasure caring for you.

## 2020-02-23 ENCOUNTER — Encounter: Payer: Self-pay | Admitting: Family Medicine

## 2020-03-25 MED FILL — ESCITALOPRAM 10 MG TABLET: 10 | 30 days supply | Qty: 30 | Fill #1

## 2020-04-03 ENCOUNTER — Ambulatory Visit: Payer: No Typology Code available for payment source | Admitting: Family Medicine

## 2020-04-13 ENCOUNTER — Other Ambulatory Visit: Payer: Self-pay

## 2020-04-13 ENCOUNTER — Encounter: Payer: Self-pay | Admitting: Family Medicine

## 2020-04-13 ENCOUNTER — Other Ambulatory Visit: Payer: Self-pay | Admitting: Family Medicine

## 2020-04-13 ENCOUNTER — Ambulatory Visit (INDEPENDENT_AMBULATORY_CARE_PROVIDER_SITE_OTHER): Payer: No Typology Code available for payment source | Admitting: Family Medicine

## 2020-04-13 VITALS — BP 108/62 | HR 80 | Temp 98.3°F | Ht 63.0 in | Wt 241.8 lb

## 2020-04-13 DIAGNOSIS — Z23 Encounter for immunization: Secondary | ICD-10-CM

## 2020-04-13 DIAGNOSIS — F331 Major depressive disorder, recurrent, moderate: Secondary | ICD-10-CM | POA: Diagnosis not present

## 2020-04-13 DIAGNOSIS — F988 Other specified behavioral and emotional disorders with onset usually occurring in childhood and adolescence: Secondary | ICD-10-CM

## 2020-04-13 MED ORDER — ESCITALOPRAM OXALATE 20 MG PO TABS: 20.0000 mg | ORAL_TABLET | Freq: Every day | ORAL | 1 refills | Status: DC

## 2020-04-13 MED ORDER — LISDEXAMFETAMINE DIMESYLATE 30 MG PO CAPS: 30.0000 mg | ORAL_CAPSULE | Freq: Every day | ORAL | 0 refills | Status: DC

## 2020-04-13 MED FILL — ESCITALOPRAM 20 MG TABLET: 20 | 90 days supply | Qty: 90 | Fill #0

## 2020-04-13 MED FILL — VYVANSE 30 MG CAPSULE: 30 | 30 days supply | Qty: 30 | Fill #0

## 2020-04-13 NOTE — Progress Notes (Signed)
Subjective  CC:  Chief Complaint  Patient presents with  . ADHD  . Anxiety    some improvement since starting Lexapro   . PMDD    recently started seeing therapist this week, this was brought up during her session    HPI: [name] is a 39 y.o. female who presents to the office today to address the problems listed above in the chief complaint, mood problems.  6-week follow-up after starting Lexapro and restarting ADD medications.  Unfortunately, Fairy continues to struggle.  She feels like her depression is mildly improved but she continues to have cyclical anxiety symptoms may be related to PMDD.  The Adderall is not helped her ADD symptoms at all.  Her symptom complex is complicated.  She denies true panic attacks but admits to chest tightening week before her cycle starts.  She has not had a Xanax because she is crying noted.  She does feel the Lexapro has improved her mood.  She feels motivated but cannot get things done.  She wants to clean the house and get chores done etc. but feels stuck.  She started seeing a therapist last week.  Her husband takes Vyvanse and she thinks maybe it would be better for her.  No adverse effects from medications.   Depression screen [facility] 2/9 04/13/2020 02/10/2020 10/29/2018  Decreased Interest 1 2 2   Down, Depressed, Hopeless 2 1 1   PHQ - 2 Score 3 3 3   Altered sleeping 0 0 1  Tired, decreased energy 2 1 1   Change in appetite 1 1 0  Feeling bad or failure about yourself  3 2 0  Trouble concentrating 3 3 0  Moving slowly or fidgety/restless 1 1 1   Suicidal thoughts 0 0 0  PHQ-9 Score 13 11 6   Difficult doing work/chores Extremely dIfficult Very difficult Not difficult at all   GAD 7 : Generalized Anxiety Score 04/13/2020 02/10/2020 06/23/2018  Nervous, Anxious, on Edge 2 3 2   Control/stop worrying 2 3 2   Worry too much - different things 1 3 2   Trouble relaxing 1 2 2   Restless 0 0 0  Easily annoyed or irritable 2 2 3   Afraid - awful might  happen 2 3 1   Total GAD 7 Score 10 16 12   Anxiety Difficulty Extremely difficult Very difficult Very difficult     Assessment  1. Moderate recurrent major depression (HCC)   2. Attention deficit disorder (ADD) without hyperactivity   3. Need for immunization against influenza      Plan   Mood disorder: Difficult, has anxiety symptoms, depressive symptoms, ADD symptoms and they are cyclical.  Mild improvement with Lexapro.  Increase Lexapro to 20 mg daily.  Recommend Xanax for panic symptoms.  Change to Vyvanse for ADD symptoms and recheck 3 months.  If still uncontrolled, recommend more formal evaluation for ADD and neurocognitive function.  Patient agrees  Reviewed concept of mood problems caused by biochemical imbalance of neurotransmitters and rationale for treatment with medications and therapy.   Counseling given: pt was instructed to contact office, on-call physician or crisis Hotline if symptoms worsen significantly. If patient develops any suicidal or homicidal thoughts, she is directed to the ER immediately.   Follow up: No follow-ups on file.  Orders Placed This Encounter  Procedures  . Flu Vaccine QUAD 36+ mos IM   Meds ordered this encounter  Medications  . escitalopram (LEXAPRO) 20 MG tablet    Sig: Take 1 tablet (20 mg total) by  mouth daily.    Dispense:  90 tablet    Refill:  1  . lisdexamfetamine (VYVANSE) 30 MG capsule    Sig: Take 1 capsule (30 mg total) by mouth daily.    Dispense:  30 capsule    Refill:  0  . lisdexamfetamine (VYVANSE) 30 MG capsule    Sig: Take 1 capsule (30 mg total) by mouth daily.    Dispense:  30 capsule    Refill:  0      I reviewed the patients updated PMH, FH, and SocHx.    Patient Active Problem List   Diagnosis Date Noted  . Cholelithiasis 11/03/2019  . Attention deficit disorder (ADD) without hyperactivity 10/29/2018  . Moderate recurrent major depression (HCC) 08/09/2018  . Personal history of congenital hip dysplasia  03/23/2018  . History of postpartum depression 03/23/2018  . 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 Congenital subvalvular aortic stenosis -repaired 03/26/2017  . Deaf - LEFT ear 03/26/2017  . Aortic valve insufficiency 11/25/2016  . 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 ALPRAZolam (XANAX) 0.5 MG tablet Take 1 tablet (0.5 mg total) by mouth daily as needed for anxiety.  Marland Kitchen escitalopram (LEXAPRO) 20 MG tablet Take 1 tablet (20 mg total) by mouth daily.  . [DISCONTINUED] escitalopram (LEXAPRO) 10 MG tablet Take 1 tablet (10 mg total) by mouth daily.  . [DISCONTINUED] methylphenidate (RITALIN LA) 20 MG 24 hr capsule Take 1 capsule (20 mg total) by mouth daily.    Allergies: Patient is allergic to codeine. Family history:  Patient family history includes ADD / ADHD in her son; Anemia in her mother; Anxiety disorder in her mother and son; Cancer in her maternal grandmother; Depression in her mother; GER disease in her mother; Heart attack in her father; ODD in her son; Peripheral vascular disease in her mother; Varicose Veins in her mother. Social History   Socioeconomic History  . Marital status: Married    Spouse name: jeff  . Number of children: 3  . Years of education: Not on file  . Highest education level: Not on file  Occupational History  . Not on file  Tobacco Use  . Smoking status: Never Smoker  . Smokeless tobacco: Never Used  Vaping Use  . Vaping Use: Never used  Substance and Sexual Activity  . Alcohol use: Yes    Comment: 1 q mth  . Drug use: No  . Sexual activity: Yes    Birth control/protection: Condom  Other Topics Concern  . Not on file  Social History Narrative  . Not on file   Social Determinants of Health   Financial Resource Strain:   . Difficulty of Paying Living Expenses: Not on file  Food Insecurity:   . Worried About Programme researcher, broadcasting/film/video in the Last Year: Not on file  . Ran Out of Food in the Last Year: Not on  file  Transportation Needs:   . Lack of Transportation (Medical): Not on file  . Lack of Transportation (Non-Medical): Not on file  Physical Activity:   . Days of Exercise per Week: Not on file  . Minutes of Exercise per Session: Not on file  Stress:   . Feeling of Stress : Not on file  Social Connections:   . Frequency of Communication with Friends and Family: Not on file  . Frequency of Social Gatherings with Friends and Family: Not on file  . Attends Religious Services: Not on file  . Active Member  of Clubs or Organizations: Not on file  . Attends Banker Meetings: Not on file  . Marital Status: Not on file     Review of Systems: Constitutional: Negative for fever malaise or anorexia Cardiovascular: negative for chest pain Respiratory: negative for SOB or persistent cough Gastrointestinal: negative for abdominal pain  Objective  Vitals: BP 108/62   Pulse 80   Temp 98.3 F (36.8 C) (Temporal)   Ht 5\' 3"  (1.6 m)   Wt 241 lb 12.8 oz (109.7 kg)   SpO2 99%   BMI 42.83 kg/m  General: no acute distress, well appearing, no apparent distress, well groomed Psych:  Alert and oriented x 3,normal mood, behavior, speech, dress, and thought processes.     Commons side effects, risks, benefits, and alternatives for medications and treatment plan prescribed today were discussed, and the patient expressed understanding of the given instructions. Patient is instructed to call or message via MyChart if he/she has any questions or concerns regarding our treatment plan. No barriers to understanding were identified. We discussed Red Flag symptoms and signs in detail. Patient expressed understanding regarding what to do in case of urgent or emergency type symptoms.   Medication list was reconciled, printed and provided to the patient in AVS. Patient instructions and summary information was reviewed with the patient as documented in the AVS. This note was prepared with assistance of  Dragon voice recognition software. Occasional wrong-word or sound-a-like substitutions may have occurred due to the inherent limitations of voice recognition software

## 2020-04-13 NOTE — Patient Instructions (Signed)
Please return in 3 months for your annual complete physical; please come fasting and follow up mood.   Continue counseling and let's switch the meds a bit: increase dose of lexapro and change to vyvanse.   If you have any questions or concerns, please don't hesitate to send me a message via MyChart or call the office at [phone]. Thank you for visiting with [place] today! It's our pleasure caring for you.

## 2020-05-14 MED FILL — VYVANSE 30 MG CAPSULE: 30 | 30 days supply | Qty: 30 | Fill #0

## 2020-06-12 ENCOUNTER — Other Ambulatory Visit: Payer: Self-pay | Admitting: Family Medicine

## 2020-06-13 ENCOUNTER — Other Ambulatory Visit: Payer: Self-pay | Admitting: Family Medicine

## 2020-06-13 MED FILL — VYVANSE 30 MG CAPSULE: 30 | 30 days supply | Qty: 30 | Fill #0

## 2020-06-13 NOTE — Telephone Encounter (Signed)
Last refill: 05/13/20 #30, 0 Last OV: 04/13/20 dx. Depression

## 2020-07-04 MED FILL — ESCITALOPRAM 20 MG TABLET: 20 | 90 days supply | Qty: 90 | Fill #1

## 2020-07-12 ENCOUNTER — Other Ambulatory Visit: Payer: Self-pay | Admitting: Family Medicine

## 2020-07-12 NOTE — Telephone Encounter (Signed)
Last refill: 06/13/20 #30,0 Last OV: 04/13/20 dx. Depression, ADD f/u

## 2020-07-13 ENCOUNTER — Other Ambulatory Visit: Payer: Self-pay | Admitting: Family Medicine

## 2020-07-13 MED FILL — VYVANSE 30 MG CAPSULE: 30 | 30 days supply | Qty: 30 | Fill #0

## 2020-07-31 ENCOUNTER — Other Ambulatory Visit (HOSPITAL_COMMUNITY): Payer: Self-pay | Admitting: Family Medicine

## 2020-07-31 ENCOUNTER — Ambulatory Visit (INDEPENDENT_AMBULATORY_CARE_PROVIDER_SITE_OTHER): Payer: No Typology Code available for payment source | Admitting: Family Medicine

## 2020-07-31 ENCOUNTER — Encounter: Payer: Self-pay | Admitting: Family Medicine

## 2020-07-31 ENCOUNTER — Other Ambulatory Visit: Payer: Self-pay

## 2020-07-31 ENCOUNTER — Ambulatory Visit (INDEPENDENT_AMBULATORY_CARE_PROVIDER_SITE_OTHER): Payer: No Typology Code available for payment source

## 2020-07-31 VITALS — BP 124/78 | HR 68 | Temp 97.6°F | Resp 15 | Ht 63.0 in | Wt 253.6 lb

## 2020-07-31 DIAGNOSIS — F988 Other specified behavioral and emotional disorders with onset usually occurring in childhood and adolescence: Secondary | ICD-10-CM

## 2020-07-31 DIAGNOSIS — F331 Major depressive disorder, recurrent, moderate: Secondary | ICD-10-CM | POA: Diagnosis not present

## 2020-07-31 DIAGNOSIS — R197 Diarrhea, unspecified: Secondary | ICD-10-CM

## 2020-07-31 DIAGNOSIS — F430 Acute stress reaction: Secondary | ICD-10-CM | POA: Diagnosis not present

## 2020-07-31 DIAGNOSIS — Z0000 Encounter for general adult medical examination without abnormal findings: Secondary | ICD-10-CM

## 2020-07-31 LAB — COMPREHENSIVE METABOLIC PANEL
ALT: 21 U/L (ref 0–35)
AST: 19 U/L (ref 0–37)
Albumin: 4 g/dL (ref 3.5–5.2)
Alkaline Phosphatase: 75 U/L (ref 39–117)
BUN: 17 mg/dL (ref 6–23)
CO2: 30 mEq/L (ref 19–32)
Calcium: 9.1 mg/dL (ref 8.4–10.5)
Chloride: 100 mEq/L (ref 96–112)
Creatinine, Ser: 0.64 mg/dL (ref 0.40–1.20)
GFR: 112.3 mL/min (ref >60.00)
Glucose, Bld: 95 mg/dL (ref 70–99)
Potassium: 4.5 mEq/L (ref 3.5–5.1)
Sodium: 136 mEq/L (ref 135–145)
Total Bilirubin: 0.4 mg/dL (ref 0.2–1.2)
Total Protein: 7.4 g/dL (ref 6.0–8.3)

## 2020-07-31 LAB — CBC WITH DIFFERENTIAL/PLATELET
Basophils Absolute: 0 10*3/uL (ref 0.0–0.1)
Basophils Relative: 0.5 % (ref 0.0–3.0)
Eosinophils Absolute: 0 10*3/uL (ref 0.0–0.7)
Eosinophils Relative: 0.7 % (ref 0.0–5.0)
HCT: 35.5 % — ABNORMAL LOW (ref 36.0–46.0)
Hemoglobin: 11.8 g/dL — ABNORMAL LOW (ref 12.0–15.0)
Lymphocytes Relative: 25.4 % (ref 12.0–46.0)
Lymphs Abs: 1.9 10*3/uL (ref 0.7–4.0)
MCHC: 33.2 g/dL (ref 30.0–36.0)
MCV: 83.6 fl (ref 78.0–100.0)
Monocytes Absolute: 0.4 10*3/uL (ref 0.1–1.0)
Monocytes Relative: 5.2 % (ref 3.0–12.0)
Neutro Abs: 5.1 10*3/uL (ref 1.4–7.7)
Neutrophils Relative %: 68.2 % (ref 43.0–77.0)
Platelets: 255 10*3/uL (ref 150.0–400.0)
RBC: 4.24 Mil/uL (ref 3.87–5.11)
RDW: 14.8 % (ref 11.5–15.5)
WBC: 7.4 10*3/uL (ref 4.0–10.5)

## 2020-07-31 LAB — LIPID PANEL
Cholesterol: 174 mg/dL (ref 0–200)
HDL: 57.2 mg/dL (ref >39.00)
LDL Cholesterol: 103 mg/dL — ABNORMAL HIGH (ref 0–99)
NonHDL: 116.45
Total CHOL/HDL Ratio: 3
Triglycerides: 69 mg/dL (ref 0.0–149.0)
VLDL: 13.8 mg/dL (ref 0.0–40.0)

## 2020-07-31 LAB — TSH: TSH: 2.64 u[IU]/mL (ref 0.35–4.50)

## 2020-07-31 MED ORDER — LISDEXAMFETAMINE DIMESYLATE 30 MG PO CAPS: 30.0000 mg | ORAL_CAPSULE | Freq: Every day | ORAL | 0 refills | Status: DC

## 2020-07-31 NOTE — Progress Notes (Signed)
Subjective  Chief Complaint  Patient presents with  . Annual Exam    Fasting   . ADD    Switched to Vyvanse 30 mg at last visit, states she has noticed improvement   . Depression    Uncontrolled currently   . Diarrhea    States she has not had a solid bowel movement since christmas, c/o bloating and mucus  . Anxiety    Improvement since started Lexapro 20 mg daily and Xanax 0.5 mg PRN    HPI: [name] is a 40 y.o. female who presents to [facility] today for a Female Wellness Visit.  She also has the concerns and/or needs as listed above in the chief complaint. These will be addressed in addition to the Health Maintenance Visit.   Wellness Visit: annual visit with health maintenance review and exam without Pap   Health maintenance: 40 year old female, visiting mother, presents for physical.  Overdue for female wellness and Pap smear.  She will schedule with her gynecologist.  Immunizations are up-to-date. Chronic disease management visit and/or acute problem visit:  ADD follow-up: We switched from Adderall to Vyvanse at her last visit in November.  Fortunately she has had a good response.  She feels that her attention and focus are much improved without adverse effects.  She continues to feel overwhelmed by numerous things that she has to do in her day, but definitely feels the medication change has been effective.  Major depression with anxiety and stress reaction: See last visit.  Fortunately, she is improved significantly.  We increase Lexapro from 10-20 within the first 2 weeks she noticed much less anxiety.  She is not needing Xanax at all.  She is sleeping better.  Much less worried overall.  No adverse effects.  She started seeing a therapist virtually but this was not effective.  She still would like to see a therapist.  She continues to struggle with managing her daily responsibilities and is not sure if this is related to ADD, anxiety or  something else.  She would like help with management of her time.  She reports she has loose stool for the last 2 months.  She describes small amounts of soft brown stool but sometimes associated with gas and mucus.  She denies blood in the stool.  Occasional abdominal cramping but no significant pain.  No history of IBS or inflammatory bowel disease patient or family members.  Possibly stress related.  No fevers or chills.  She is on a probiotic without improvement in symptoms.  She has never had a colonoscopy   Assessment  1. Annual physical exam   2. Moderate recurrent major depression (HCC)   3. Attention deficit disorder (ADD) without hyperactivity   4. Stress reaction   5. Diarrhea, unspecified type      Plan  Female Wellness Visit:  Age appropriate Health Maintenance and Prevention measures were discussed with patient. Included topics are cancer screening recommendations, ways to keep healthy (see AVS) including dietary and exercise recommendations, regular eye and dental care, use of seat belts, and avoidance of moderate alcohol use and tobacco use.  Patient will schedule with GYN for Pap smear that is due  BMI: discussed patient's BMI and encouraged positive lifestyle modifications to help get to or maintain a target BMI.  HM needs and immunizations were addressed and ordered. See below for orders. See HM and immunization section for updates.  Routine labs and screening tests ordered including cmp,  cbc and lipids where appropriate.  Discussed recommendations regarding Vit D and calcium supplementation (see AVS)  Chronic disease f/u and/or acute problem visit: (deemed necessary to be done in addition to the wellness visit):  Depression with anxiety reactions: Much improved on Lexapro 20 mg daily.  Continue and recheck in 3 months.  Do recommend counseling.  Will refer to Dr. Earnest ConroyFlores.  Assailants of being overwhelmed could be due to her problems with ADD anxiety but also could be to  trying to manage too much.  Discussed these possibilities.  She will reassess her responsibilities.  ADD: Much improved on Vyvanse daily.  Refill for the next 3 months.  Reassess in 3 months.  Loose stool: Unclear etiology.  Check KUB to ensure not due to chronic constipation and overflow.  Check GI pathogen to rule out infectious possibilities.  If etiology remains unclear will refer to GI due to chronicity and mucus in the stool.  Follow up: 3 months to recheck mood and ADD  Orders Placed This Encounter  Procedures  . DG Abd 1 View  . Clostridium difficile Toxin B, Qualitative, Real-Time PCR  . CBC with Differential/Platelet  . Comprehensive metabolic panel  . Lipid panel  . TSH  . Gastrointestinal Pathogen Panel PCR  . Hepatitis C antibody   Meds ordered this encounter  Medications  . lisdexamfetamine (VYVANSE) 30 MG capsule    Sig: Take 1 capsule (30 mg total) by mouth daily.    Dispense:  30 capsule    Refill:  0  . lisdexamfetamine (VYVANSE) 30 MG capsule    Sig: Take 1 capsule (30 mg total) by mouth daily.    Dispense:  30 capsule    Refill:  0  . lisdexamfetamine (VYVANSE) 30 MG capsule    Sig: Take 1 capsule (30 mg total) by mouth daily.    Dispense:  30 capsule    Refill:  0       Body mass index is 44.92 kg/m. Wt Readings from Last 3 Encounters:  07/31/20 253 lb 9.6 oz (115 kg)  04/13/20 241 lb 12.8 oz (109.7 kg)  02/10/20 231 lb 6.4 oz (105 kg)     Patient Active Problem List   Diagnosis Date Noted  . Cholelithiasis 11/03/2019  . Attention deficit disorder (ADD) without hyperactivity 10/29/2018  . Moderate recurrent major depression (HCC) 08/09/2018  . Personal history of congenital hip dysplasia 03/23/2018    Treated with Pavloc harness   . History of postpartum depression 03/23/2018  . 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 Congenital subvalvular aortic stenosis -repaired 03/26/2017  . Deaf - LEFT ear 03/26/2017  . Aortic valve  insufficiency 11/25/2016    Overview:  Mild   . 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 Hepatitis C Screening  Never done  . PAP SMEAR-Modifier  06/10/2019  . TETANUS/TDAP  12/08/2026  . INFLUENZA VACCINE  Completed  . COVID-19 Vaccine  Completed  . HIV Screening  Completed   Immunization History  Administered Date(s) Administered  . Influenza Split 07/30/2012  . Influenza,inj,Quad PF,6+ Mos 04/13/2020  . Moderna Sars-Covid-2 Vaccination 05/25/2020  . PFIZER(Purple Top)SARS-COV-2 Vaccination 09/08/2019, 10/03/2019   We updated and reviewed the patient's past history in detail and it is documented below. Allergies: Patient  reports current alcohol use. Past Medical History Patient  has a past medical history of Anemia, Anti-Duffy antibodies present, Deafness, Deafness in left ear, Depression, ELECTROCARDIOGRAM, ABNORMAL (12/28/2008), Fractured coccyx (HCC), GERD (gastroesophageal reflux disease),  Heart murmur, History of postpartum depression (03/23/2018), varicella, Personal history of congenital hip dysplasia (03/23/2018), and Subaortic stenosis. Past Surgical History Patient  has a past surgical history that includes Cardiac surgery; Cesarean section; Cesarean section (N/A, 07/28/2012); Cesarean section (N/A, 03/26/2017); and Eye surgery. Social History   Socioeconomic History  . Marital status: Married    Spouse name: jeff  . Number of children: 3  . Years of education: Not on file  . Highest education level: Not on file  Occupational History  . Not on file  Tobacco Use  . Smoking status: Never Smoker  . Smokeless tobacco: Never Used  Vaping Use  . Vaping Use: Never used  Substance and Sexual Activity  . Alcohol use: Yes    Comment: 1 q mth  . Drug use: No  . Sexual activity: Yes    Birth control/protection: Condom  Other Topics Concern  . Not on file  Social History Narrative  . Not on file   Social Determinants of Health    Financial Resource Strain: Not on file  Food Insecurity: Not on file  Transportation Needs: Not on file  Physical Activity: Not on file  Stress: Not on file  Social Connections: Not on file   Family History  Problem Relation Age of Onset  . Peripheral vascular disease Mother   . Varicose Veins Mother   . Anemia Mother   . Depression Mother   . Anxiety disorder Mother   . GER disease Mother   . Cancer Maternal Grandmother        breast  . Heart attack Father   . Anxiety disorder Son   . ODD Son   . ADD / ADHD Son     Review of Systems: Constitutional: negative for fever or malaise Ophthalmic: negative for photophobia, double vision or loss of vision Cardiovascular: negative for chest pain, dyspnea on exertion, or new LE swelling Respiratory: negative for SOB or persistent cough Gastrointestinal: negative for abdominal pain, change in bowel habits or melena Genitourinary: negative for dysuria or gross hematuria, no abnormal uterine bleeding or disharge Musculoskeletal: negative for new gait disturbance or muscular weakness Integumentary: negative for new or persistent rashes, no breast lumps Neurological: negative for TIA or stroke symptoms Psychiatric: negative for SI or delusions Allergic/Immunologic: negative for hives  Patient Care Team    Relationship Specialty Notifications Start End  Willow Ora, MD PCP - General Family Medicine  03/23/18   Olivia Mackie, MD Consulting Physician Obstetrics and Gynecology  03/23/18   Alfredo Martinez, MD Consulting Physician Urology  06/23/18     Objective  Vitals: BP 124/78   Pulse 68   Temp 97.6 F (36.4 C) (Temporal)   Resp 15   Ht 5\' 3"  (1.6 m)   Wt 253 lb 9.6 oz (115 kg)   LMP 07/12/2020 (Exact Date)   SpO2 98%   BMI 44.92 kg/m  General:  Well developed, well nourished, no acute distress  Psych:  Alert and orientedx3,normal mood and affect HEENT:  Normocephalic, atraumatic, non-icteric sclera, PERRL, supple  neck without adenopathy, mass or thyromegaly Cardiovascular:  Normal S1, S2, RRR without gallop, rub +murmur Respiratory:  Good breath sounds bilaterally, CTAB with normal respiratory effort Gastrointestinal: normal bowel sounds, soft, non-tender, no noted masses. No HSM MSK: no deformities, contusions. Joints are without erythema or swelling.  Skin:  Warm, no rashes or suspicious lesions noted Neurologic:    Mental status is normal. Gross motor and sensory exams are normal. Normal gait. Slight  tremor     Commons side effects, risks, benefits, and alternatives for medications and treatment plan prescribed today were discussed, and the patient expressed understanding of the given instructions. Patient is instructed to call or message via MyChart if he/she has any questions or concerns regarding our treatment plan. No barriers to understanding were identified. We discussed Red Flag symptoms and signs in detail. Patient expressed understanding regarding what to do in case of urgent or emergency type symptoms.   Medication list was reconciled, printed and provided to the patient in AVS. Patient instructions and summary information was reviewed with the patient as documented in the AVS. This note was prepared with assistance of Dragon voice recognition software. Occasional wrong-word or sound-a-like substitutions may have occurred due to the inherent limitations of voice recognition software  This visit occurred during the SARS-CoV-2 public health emergency.  Safety protocols were in place, including screening questions prior to the visit, additional usage of staff PPE, and extensive cleaning of exam room while observing appropriate contact time as indicated for disinfecting solutions.

## 2020-07-31 NOTE — Patient Instructions (Signed)
Please return in 3 months to recheck ADD and mood.  I will release your lab results to you on your MyChart account with further instructions. Please reply with any questions.   Please call  Behavioral Health Office to schedule an appointment with Dr. Colen Darling; she is a therapist here at our [place] office.  The phone number is: [phone]  Please schedule an appointment with your GYN to have your pap smear done.   If you have any questions or concerns, please don't hesitate to send me a message via MyChart or call the office at [phone]. Thank you for visiting with [name] today! It's our pleasure caring for you.

## 2020-08-01 LAB — HEPATITIS C ANTIBODY
Hepatitis C Ab: NONREACTIVE
SIGNAL TO CUT-OFF: 0.11 (ref <1.00)

## 2020-08-08 LAB — CLOSTRIDIUM DIFFICILE TOXIN B, QUALITATIVE, REAL-TIME PCR: Toxigenic C. Difficile by PCR: NOT DETECTED

## 2020-08-08 LAB — GASTROINTESTINAL PATHOGEN PANEL PCR

## 2020-09-28 ENCOUNTER — Other Ambulatory Visit (HOSPITAL_COMMUNITY): Payer: Self-pay

## 2020-09-28 MED FILL — Lisdexamfetamine Dimesylate Cap 30 MG: ORAL | 30 days supply | Qty: 30 | Fill #0 | Status: AC

## 2020-09-29 ENCOUNTER — Other Ambulatory Visit (HOSPITAL_COMMUNITY): Payer: Self-pay

## 2020-11-06 ENCOUNTER — Ambulatory Visit: Payer: No Typology Code available for payment source | Admitting: Family Medicine

## 2020-11-09 ENCOUNTER — Encounter: Payer: Self-pay | Admitting: Family Medicine

## 2020-11-09 ENCOUNTER — Other Ambulatory Visit: Payer: Self-pay

## 2020-11-09 ENCOUNTER — Ambulatory Visit (INDEPENDENT_AMBULATORY_CARE_PROVIDER_SITE_OTHER): Payer: No Typology Code available for payment source | Admitting: Family Medicine

## 2020-11-09 VITALS — BP 124/82 | HR 66 | Temp 98.0°F | Ht 63.0 in | Wt 262.0 lb

## 2020-11-09 DIAGNOSIS — F430 Acute stress reaction: Secondary | ICD-10-CM | POA: Diagnosis not present

## 2020-11-09 DIAGNOSIS — F988 Other specified behavioral and emotional disorders with onset usually occurring in childhood and adolescence: Secondary | ICD-10-CM

## 2020-11-09 DIAGNOSIS — F331 Major depressive disorder, recurrent, moderate: Secondary | ICD-10-CM | POA: Diagnosis not present

## 2020-11-09 DIAGNOSIS — R197 Diarrhea, unspecified: Secondary | ICD-10-CM

## 2020-11-09 NOTE — Patient Instructions (Addendum)
Please return in 6 months for ADD and mood recheck.   Send me a refill request when needed for vyvanse.  We will call you with information regarding your referral appointment. Gastroenterology due to your persistent diarrhea. If you do not hear from [place] within the next 2 weeks, please let me know. It can take 1-2 weeks to get appointments set up with the specialists.    If you have any questions or concerns, please don't hesitate to send me a message via MyChart or call the office at [phone]. Thank you for visiting with [place] today! It's our pleasure caring for you.  6 months for mood recheck and ADD recheck.  Sooner if problems

## 2020-11-12 NOTE — Progress Notes (Signed)
Subjective  CC:  Chief Complaint  Patient presents with  . ADD  . Anxiety    HPI: [name] is a 40 y.o. female who presents to the office today to address the problems listed above in the chief complaint, mood problems.  ADD follow-up: Patient reports that she is doing well on her Vyvanse 30 mg daily.  Last month she stopped all of her medications abruptly.  There were no clear indications for this.  After doing this, she realized how much her Vyvanse helped her with her attention and focus.  It helps her with task completion.  She has no adverse side effects.  She reports she is keeping up with her responsibilities well while on the medications.  She is not needed refill because she has medications at home.  Depression and stress reaction. She continues on Lexapro 20 mg although she did take a brief hiatus as noted above.  Once back on the medications, she quickly notices improvement.  She continues to work with her counselor.  No new symptoms.  Diarrhea, chronic: Started back in December.  Loose frequent bowel movements without mucus or blood.  Although she does report that she had a few stools with mucus at the very beginning.  She denies abdominal pain or fevers.  No history of irritable bowel syndrome.  She has frequent bowel movements, up to 5/day.  She does not use over-the-counter medications like Imodium.  Appetite is fine.   Depression screen [facility] 2/9 11/09/2020 07/31/2020 04/13/2020  Decreased Interest 2 1 1   Down, Depressed, Hopeless 1 1 2   PHQ - 2 Score 3 2 3   Altered sleeping 2 3 0  Tired, decreased energy 2 3 2   Change in appetite 1 1 1   Feeling bad or failure about yourself  2 1 3   Trouble concentrating 1 1 3   Moving slowly or fidgety/restless 0 0 1  Suicidal thoughts 0 0 0  PHQ-9 Score 11 11 13   Difficult doing work/chores Very difficult Somewhat difficult Extremely dIfficult  Some recent data might be hidden   GAD 7 : Generalized Anxiety Score 07/31/2020  04/13/2020 02/10/2020 06/23/2018  Nervous, Anxious, on Edge 1 2 3 2   Control/stop worrying 1 2 3 2   Worry too much - different things 1 1 3 2   Trouble relaxing 1 1 2 2   Restless 0 0 0 0  Easily annoyed or irritable 1 2 2 3   Afraid - awful might happen 1 2 3 1   Total GAD 7 Score 6 10 16 12   Anxiety Difficulty Somewhat difficult Extremely difficult Very difficult Very difficult     Assessment  1. Attention deficit disorder (ADD) without hyperactivity   2. Diarrhea, unspecified type   3. Moderate recurrent major depression (HCC)   4. Stress reaction      Plan   ADD: This medical condition is well controlled. There are no signs of complications, medication side effects, or red flags. Patient is instructed to continue the current treatment plan without change in therapies or medications.  Vyvanse 30 mg daily, she will let me know if she needs a refill.  Encouraged her to take regularly.  Depression and stress reaction: Improved again since restarting Lexapro 20 mg daily.  She has been on this steadily for about a month now.  Continue with same dose and recheck at next visit.  Encouraged her to stay on this long-term.  She agrees.  Chronic diarrhea: Needs further evaluation.  Additional studies including pathogen panel  and stool cultures are negative.  Refer to GI  Follow up: 6 months for follow-up mood and ADD, sooner if problems Orders Placed This Encounter  Procedures  . Ambulatory referral to Gastroenterology   No orders of the defined types were placed in this encounter.     I reviewed the patients updated PMH, FH, and SocHx.    Patient Active Problem List   Diagnosis Date Noted  . Cholelithiasis 11/03/2019  . Attention deficit disorder (ADD) without hyperactivity 10/29/2018  . Moderate recurrent major depression (HCC) 08/09/2018  . Personal history of congenital hip dysplasia 03/23/2018  . History of postpartum depression 03/23/2018  . 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 Congenital subvalvular aortic stenosis -repaired 03/26/2017  . Deaf - LEFT ear 03/26/2017  . Aortic valve insufficiency 11/25/2016  . 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 ALPRAZolam (XANAX) 0.5 MG tablet Take 1 tablet (0.5 mg total) by mouth daily as needed for anxiety. (Patient taking differently: Take 0.5 mg by mouth daily as needed for anxiety. Has never taken, but has it just in case)  . escitalopram (LEXAPRO) 20 MG tablet TAKE 1 TABLET BY MOUTH DAILY.  Marland Kitchen lisdexamfetamine (VYVANSE) 30 MG capsule TAKE 1 CAPSULE BY MOUTH DAILY DO NOT FILL UNTIL 10/09/20  . lisdexamfetamine (VYVANSE) 30 MG capsule TAKE 1 CAPSULE BY MOUTH DAILY DO NOT FILL UNTIL 09/09/20  . [DISCONTINUED] lisdexamfetamine (VYVANSE) 30 MG capsule Take 1 capsule (30 mg total) by mouth daily.  . [DISCONTINUED] lisdexamfetamine (VYVANSE) 30 MG capsule Take 1 capsule (30 mg total) by mouth daily.  . [DISCONTINUED] lisdexamfetamine (VYVANSE) 30 MG capsule Take 1 capsule (30 mg total) by mouth daily.  . [DISCONTINUED] lisdexamfetamine (VYVANSE) 30 MG capsule TAKE 1 CAPSULE BY MOUTH ONCE A DAY    Allergies: Patient is allergic to codeine. Family history:  Patient family history includes ADD / ADHD in her son; Anemia in her mother; Anxiety disorder in her mother and son; Cancer in her maternal grandmother; Depression in her mother; GER disease in her mother; Heart attack in her father; ODD in her son; Peripheral vascular disease in her mother; Varicose Veins in her mother. Social History   Socioeconomic History  . Marital status: Married    Spouse name: jeff  . Number of children: 3  . Years of education: Not on file  . Highest education level: Not on file  Occupational History  . Not on file  Tobacco Use  . Smoking status: Never Smoker  . Smokeless tobacco: Never Used  Vaping Use  . Vaping Use: Never used  Substance and Sexual Activity  . Alcohol use: Yes    Comment: 1 q mth   . Drug use: No  . Sexual activity: Yes    Birth control/protection: Condom  Other Topics Concern  . Not on file  Social History Narrative  . Not on file   Social Determinants of Health   Financial Resource Strain: Not on file  Food Insecurity: Not on file  Transportation Needs: Not on file  Physical Activity: Not on file  Stress: Not on file  Social Connections: Not on file     Review of Systems: Constitutional: Negative for fever malaise or anorexia Cardiovascular: negative for chest pain Respiratory: negative for SOB or persistent cough Gastrointestinal: negative for abdominal pain  Objective  Vitals: BP 124/82   Pulse 66   Temp 98 F (36.7 C) (Temporal)   Ht 5\' 3"  (1.6 m)   Wt 262  lb (118.8 kg)   LMP 11/07/2020 (Exact Date)   SpO2 98%   BMI 46.41 kg/m  General: no acute distress, well appearing, no apparent distress, well groomed Psych:  Alert and oriented x 3,normal mood, behavior, speech, dress, and thought processes.     Commons side effects, risks, benefits, and alternatives for medications and treatment plan prescribed today were discussed, and the patient expressed understanding of the given instructions. Patient is instructed to call or message via MyChart if he/she has any questions or concerns regarding our treatment plan. No barriers to understanding were identified. We discussed Red Flag symptoms and signs in detail. Patient expressed understanding regarding what to do in case of urgent or emergency type symptoms.   Medication list was reconciled, printed and provided to the patient in AVS. Patient instructions and summary information was reviewed with the patient as documented in the AVS. This note was prepared with assistance of Dragon voice recognition software. Occasional wrong-word or sound-a-like substitutions may have occurred due to the inherent limitations of voice recognition software

## 2020-12-05 ENCOUNTER — Other Ambulatory Visit (HOSPITAL_COMMUNITY): Payer: Self-pay

## 2020-12-05 MED FILL — Lisdexamfetamine Dimesylate Cap 30 MG: ORAL | 30 days supply | Qty: 30 | Fill #0 | Status: AC

## 2020-12-07 ENCOUNTER — Other Ambulatory Visit (HOSPITAL_COMMUNITY): Payer: Self-pay

## 2020-12-17 ENCOUNTER — Ambulatory Visit: Payer: No Typology Code available for payment source | Admitting: Psychology

## 2021-01-03 ENCOUNTER — Ambulatory Visit: Payer: No Typology Code available for payment source | Admitting: Psychology

## 2021-01-17 ENCOUNTER — Other Ambulatory Visit: Payer: Self-pay | Admitting: Family Medicine

## 2021-01-17 ENCOUNTER — Other Ambulatory Visit (HOSPITAL_COMMUNITY): Payer: Self-pay

## 2021-01-17 MED ORDER — ESCITALOPRAM OXALATE 20 MG PO TABS
ORAL_TABLET | Freq: Every day | ORAL | 1 refills | Status: DC
  Filled 2021-01-17: qty 90, 90d supply, fill #0
  Filled 2021-06-22: qty 90, 90d supply, fill #1

## 2021-01-17 MED FILL — Lisdexamfetamine Dimesylate Cap 30 MG: ORAL | 30 days supply | Qty: 30 | Fill #0 | Status: AC

## 2021-01-18 ENCOUNTER — Other Ambulatory Visit (HOSPITAL_COMMUNITY): Payer: Self-pay

## 2021-01-22 ENCOUNTER — Telehealth (INDEPENDENT_AMBULATORY_CARE_PROVIDER_SITE_OTHER): Payer: No Typology Code available for payment source | Admitting: Family Medicine

## 2021-01-22 ENCOUNTER — Other Ambulatory Visit: Payer: Self-pay

## 2021-01-22 ENCOUNTER — Other Ambulatory Visit (HOSPITAL_COMMUNITY): Payer: Self-pay

## 2021-01-22 ENCOUNTER — Ambulatory Visit
Admission: EM | Admit: 2021-01-22 | Discharge: 2021-01-22 | Disposition: A | Discharge status: Home / Self Care | Payer: No Typology Code available for payment source | Attending: Urgent Care | Admitting: Urgent Care

## 2021-01-22 DIAGNOSIS — L255 Unspecified contact dermatitis due to plants, except food: Secondary | ICD-10-CM | POA: Diagnosis not present

## 2021-01-22 DIAGNOSIS — R600 Localized edema: Secondary | ICD-10-CM | POA: Diagnosis not present

## 2021-01-22 DIAGNOSIS — L299 Pruritus, unspecified: Secondary | ICD-10-CM

## 2021-01-22 DIAGNOSIS — R21 Rash and other nonspecific skin eruption: Secondary | ICD-10-CM

## 2021-01-22 MED ORDER — PREDNISONE 20 MG PO TABS
ORAL_TABLET | ORAL | 0 refills | Status: DC
  Filled 2021-01-22: qty 30, 15d supply, fill #0

## 2021-01-22 MED ORDER — HYDROXYZINE HCL 25 MG PO TABS
12.5000 mg | ORAL_TABLET | Freq: Three times a day (TID) | ORAL | 0 refills | Status: DC | PRN
  Filled 2021-01-22: qty 30, 10d supply, fill #0

## 2021-01-22 NOTE — Patient Instructions (Signed)
Seek prompt in person medical evaluation at the urgent care or the med center as we discussed.  If symptoms are worsening or you are having severe or life-threatening symptoms please call 911.   I hope you are feeling better soon!    It was nice to meet you today. I help [facility] out with telemedicine visits on Tuesdays and Thursdays and am available for visits on those days. If you have any concerns or questions following this visit please schedule a follow up visit with your Primary Care doctor or seek care at a local urgent care clinic to avoid delays in care.

## 2021-01-22 NOTE — ED Triage Notes (Signed)
Two day h/o facial swelling upon waking up. This morning patient reports when she woke up her left eye was swollen shut. Has been taking claritin and benadryl without a decrease in sxs. Pt also complains of itching and burning all over her face, with the most sensations occurring around her bilateral ears and jaw line. Pt thinks that she may have been exposed to posion ivy  while doing yard work last week.

## 2021-01-22 NOTE — Progress Notes (Signed)
Virtual Visit via Video Note  I connected with [name]  on 01/22/21 at  4:00 PM EDT by a video enabled telemedicine application and verified that I am speaking with the correct person using two identifiers.  Location patient: home, [place] Location provider:work or home office Persons participating in the virtual visit: patient, provider  I discussed the limitations of evaluation and management by telemedicine and the availability of in person appointments. The patient expressed understanding and agreed to proceed.   HPI:  Acute telemedicine visit for facial swelling: -Onset: thinks started Sunday but is worsening -Symptoms include: swelling of the face eyes, face and lips, also feels itchy on ears, neck, feels like face feels warm and is bright red and has a rash and little bumps on the face - fine rash, face also feels like it is burning, nausea -she thinks she maybe got into poison but she is not sure - can't think of other exposures -Denies: sob, CP, unsure if fevers, malaise -Has tried:claritin and benadryl but not helping  ROS: See pertinent positives and negatives per HPI.  Past Medical History:  Diagnosis Date   Anemia    Anti-Duffy antibodies present    Deafness    LT ear since birth    Deafness in left ear    Depression    ELECTROCARDIOGRAM, ABNORMAL 12/28/2008   Qualifier: Diagnosis of  By: [name], MD, Harrington Challenger    Fractured coccyx [facility])    GERD (gastroesophageal reflux disease)    Heart murmur    History of postpartum depression 03/23/2018   Hx of varicella    Personal history of congenital hip dysplasia 03/23/2018   Treated with Pavloc harness   Subaortic stenosis    repaired    Past Surgical History:  Procedure Laterality Date   CARDIAC SURGERY     open heart surgery    CESAREAN SECTION     CESAREAN SECTION N/A 07/28/2012   Procedure: CESAREAN SECTION;  Surgeon: Lenoard Aden, MD;  Location: [facility];  Service: Obstetrics;  Laterality: N/A;  Repeat  C/S  EDD: 07/20/12   CESAREAN SECTION N/A 03/26/2017   Procedure: Repeat CESAREAN SECTION;  Surgeon: Olivia Mackie, MD;  Location: [facility];  Service: Obstetrics;  Laterality: N/A;  EDD: 03/27/17 Allergy: Codeine   EYE SURGERY       Current Outpatient Medications:    ALPRAZolam (XANAX) 0.5 MG tablet, Take 1 tablet (0.5 mg total) by mouth daily as needed for anxiety. (Patient taking differently: Take 0.5 mg by mouth daily as needed for anxiety. Has never taken, but has it just in case), Disp: 30 tablet, Rfl: 0   escitalopram (LEXAPRO) 20 MG tablet, TAKE 1 TABLET BY MOUTH DAILY., Disp: 90 tablet, Rfl: 1   lisdexamfetamine (VYVANSE) 30 MG capsule, TAKE 1 CAPSULE BY MOUTH DAILY DO NOT FILL UNTIL 10/09/20, Disp: 30 capsule, Rfl: 0   lisdexamfetamine (VYVANSE) 30 MG capsule, TAKE 1 CAPSULE BY MOUTH DAILY, Disp: 30 capsule, Rfl: 0  EXAM:  VITALS per patient if applicable:  GENERAL: alert, oriented, appears well and in no acute distress  HEENT: there is significant swelling around the eyes/nose and ? Upper lip, video quality is poor so I can not see a rash  NECK: normal movements of the head and neck  LUNGS: on inspection no signs of respiratory distress, breathing rate appears normal, no obvious gross SOB, gasping or wheezing  CV: no obvious cyanosis  MS: moves all visible extremities without noticeable abnormality  PSYCH/NEURO: pleasant  and cooperative, no obvious depression or anxiety, speech and thought processing grossly intact  ASSESSMENT AND PLAN:  Discussed the following assessment and plan:  Facial edema  -we discussed possible serious and likely etiologies, options for evaluation and workup, limitations of telemedicine visit vs in person visit, treatment, treatment risks and precautions. Pt prefers to treat via telemedicine empirically rather than in person at this moment. Query angioedema, cellulitis vs significant toxicodendron reaction vs other. Given the degree  of swelling, the fact she feels is worsening and unclear dx due to inability to exam well over video visit advised prompt inperson evaluation. PCP office is not available so discussed other options for in person medical evaluation.  She has opted to seek care at an urgent care or med center this evening right away.  She prefers to go by private vehicle.     I discussed the assessment and treatment plan with the patient. The patient was provided an opportunity to ask questions and all were answered. The patient agreed with the plan and demonstrated an understanding of the instructions.     Terressa Koyanagi, DO

## 2021-01-22 NOTE — ED Provider Notes (Signed)
[facility]   MRN: [id number] DOB: July 30, 1980  Subjective:   [name] is a 40 y.o. female presenting for 3 day history of acute onset worsening pruritic rash over the neck, face with associated itching and intermittent stinging.  Patient states that she was doing yard work, feels like there was poison ivy that she had to clear through.  She did this over the weekend and her rash developed shortly thereafter.  Has not been responding to Benadryl and over-the-counter creams.  Denies much pain at all, no vision change, redness, drainage, runny or stuffy nose, sore throat, cough.  No current facility-administered medications for this encounter.  Current Outpatient Medications:    ALPRAZolam (XANAX) 0.5 MG tablet, Take 1 tablet (0.5 mg total) by mouth daily as needed for anxiety. (Patient taking differently: Take 0.5 mg by mouth daily as needed for anxiety. Has never taken, but has it just in case), Disp: 30 tablet, Rfl: 0   escitalopram (LEXAPRO) 20 MG tablet, TAKE 1 TABLET BY MOUTH DAILY., Disp: 90 tablet, Rfl: 1   lisdexamfetamine (VYVANSE) 30 MG capsule, TAKE 1 CAPSULE BY MOUTH DAILY DO NOT FILL UNTIL 10/09/20, Disp: 30 capsule, Rfl: 0   lisdexamfetamine (VYVANSE) 30 MG capsule, TAKE 1 CAPSULE BY MOUTH DAILY, Disp: 30 capsule, Rfl: 0   Allergies  Allergen Reactions   Codeine Hives    Past Medical History:  Diagnosis Date   Anemia    Anti-Duffy antibodies present    Deafness    LT ear since birth    Deafness in left ear    Depression    ELECTROCARDIOGRAM, ABNORMAL 12/28/2008   Qualifier: Diagnosis of  By: [name], MD, Harrington Challenger    Fractured coccyx [facility])    GERD (gastroesophageal reflux disease)    Heart murmur    History of postpartum depression 03/23/2018   Hx of varicella    Personal history of congenital hip dysplasia 03/23/2018   Treated with Pavloc harness   Subaortic stenosis    repaired     Past Surgical History:  Procedure Laterality  Date   CARDIAC SURGERY     open heart surgery    CESAREAN SECTION     CESAREAN SECTION N/A 07/28/2012   Procedure: CESAREAN SECTION;  Surgeon: Lenoard Aden, MD;  Location: [facility];  Service: Obstetrics;  Laterality: N/A;  Repeat C/S  EDD: 07/20/12   CESAREAN SECTION N/A 03/26/2017   Procedure: Repeat CESAREAN SECTION;  Surgeon: Olivia Mackie, MD;  Location: [facility] SU[facility];  Service: Obstetrics;  Laterality: N/A;  EDD: 03/27/17 Allergy: Codeine   EYE SURGERY      Family History  Problem Relation Age of Onset   Peripheral vascular disease Mother    Varicose Veins Mother    Anemia Mother    Depression Mother    Anxiety disorder Mother    GER disease Mother    Cancer Maternal Grandmother        breast   Heart attack Father    Anxiety disorder Son    ODD Son    ADD / ADHD Son     Social History   Tobacco Use   Smoking status: Never   Smokeless tobacco: Never  Vaping Use   Vaping Use: Never used  Substance Use Topics   Alcohol use: Yes    Comment: 1 q mth   Drug use: No    ROS   Objective:   Vitals: BP (!) 88/57 (BP Location: Left Arm) Comment: Pt reports  BP normallty reads low  Pulse 70   Temp 98.2 F (36.8 C) (Oral)   Resp 18   SpO2 97%   Breastfeeding No   Physical Exam Constitutional:      General: She is not in acute distress.    Appearance: Normal appearance. She is well-developed. She is not ill-appearing, toxic-appearing or diaphoretic.  HENT:     Head: Normocephalic and atraumatic.      Right Ear: External ear normal.     Left Ear: External ear normal.     Nose: Nose normal. No congestion or rhinorrhea.     Mouth/Throat:     Mouth: Mucous membranes are moist.     Pharynx: Oropharynx is clear. No oropharyngeal exudate or posterior oropharyngeal erythema.  Eyes:     General: Lids are everted, no foreign bodies appreciated. No scleral icterus.       Right eye: No foreign body, discharge or hordeolum.        Left eye: No foreign body,  discharge or hordeolum.     Extraocular Movements: Extraocular movements intact.     Left eye: Normal extraocular motion and no nystagmus.     Conjunctiva/sclera: Conjunctivae normal.     Right eye: Right conjunctiva is not injected. No chemosis, exudate or hemorrhage.    Left eye: Left conjunctiva is not injected. No chemosis, exudate or hemorrhage.    Pupils: Pupils are equal, round, and reactive to light.  Cardiovascular:     Rate and Rhythm: Normal rate.  Pulmonary:     Effort: Pulmonary effort is normal.  Skin:    General: Skin is warm and dry.  Neurological:     General: No focal deficit present.     Mental Status: She is alert and oriented to person, place, and time.  Psychiatric:        Mood and Affect: Mood normal.        Behavior: Behavior normal.        Thought Content: Thought content normal.        Judgment: Judgment normal.    Assessment and Plan :   PDMP not reviewed this encounter.  1. Rhus dermatitis   2. Facial rash   3. Itching     We will use a 15-day steroid course to address Rhus dermatitis.  Use Vistaril for severe itching.  Low suspicion for preseptal cellulitis, erysipelas, shingles.  Counseled patient on potential for adverse effects with medications prescribed/recommended today, ER and return-to-clinic precautions discussed, patient verbalized understanding.    Wallis Bamberg, PA-C 01/22/21 1732

## 2021-01-23 ENCOUNTER — Other Ambulatory Visit (HOSPITAL_COMMUNITY): Payer: Self-pay

## 2021-04-09 ENCOUNTER — Other Ambulatory Visit: Payer: Self-pay | Admitting: Family Medicine

## 2021-04-09 ENCOUNTER — Other Ambulatory Visit (HOSPITAL_COMMUNITY): Payer: Self-pay

## 2021-04-09 NOTE — Telephone Encounter (Signed)
Last OV- 11/09/20 Last refill- 07/31/2020 Disp- 30 capsules refills-0

## 2021-04-10 ENCOUNTER — Other Ambulatory Visit (HOSPITAL_COMMUNITY): Payer: Self-pay

## 2021-04-10 MED ORDER — LISDEXAMFETAMINE DIMESYLATE 30 MG PO CAPS
ORAL_CAPSULE | ORAL | 0 refills | Status: DC
  Filled 2021-04-10: qty 30, 30d supply, fill #0

## 2021-05-16 ENCOUNTER — Encounter: Payer: Self-pay | Admitting: Family Medicine

## 2021-05-16 ENCOUNTER — Other Ambulatory Visit (HOSPITAL_COMMUNITY): Payer: Self-pay

## 2021-05-16 ENCOUNTER — Other Ambulatory Visit: Payer: Self-pay

## 2021-05-16 ENCOUNTER — Ambulatory Visit (INDEPENDENT_AMBULATORY_CARE_PROVIDER_SITE_OTHER): Payer: No Typology Code available for payment source | Admitting: Family Medicine

## 2021-05-16 VITALS — BP 98/80 | HR 83 | Temp 97.9°F | Ht 63.0 in | Wt 265.0 lb

## 2021-05-16 DIAGNOSIS — F331 Major depressive disorder, recurrent, moderate: Secondary | ICD-10-CM

## 2021-05-16 DIAGNOSIS — Z23 Encounter for immunization: Secondary | ICD-10-CM | POA: Diagnosis not present

## 2021-05-16 DIAGNOSIS — Z6841 Body Mass Index (BMI) 40.0 and over, adult: Secondary | ICD-10-CM

## 2021-05-16 DIAGNOSIS — F988 Other specified behavioral and emotional disorders with onset usually occurring in childhood and adolescence: Secondary | ICD-10-CM | POA: Diagnosis not present

## 2021-05-16 MED ORDER — BUPROPION HCL ER (XL) 150 MG PO TB24
150.0000 mg | ORAL_TABLET | Freq: Every day | ORAL | 3 refills | Status: DC
  Filled 2021-05-16: qty 90, 90d supply, fill #0
  Filled 2021-08-18: qty 90, 90d supply, fill #1
  Filled 2021-08-27: qty 90, 90d supply, fill #2
  Filled 2021-12-03: qty 30, 30d supply, fill #2
  Filled 2022-01-06: qty 30, 30d supply, fill #3
  Filled 2022-02-06: qty 30, 30d supply, fill #4
  Filled 2022-02-24 – 2022-02-28 (×2): qty 30, 30d supply, fill #5
  Filled 2022-04-10: qty 30, 30d supply, fill #6

## 2021-05-16 MED ORDER — LISDEXAMFETAMINE DIMESYLATE 30 MG PO CAPS
30.0000 mg | ORAL_CAPSULE | Freq: Every day | ORAL | 0 refills | Status: DC
  Filled 2021-08-09: qty 30, 30d supply, fill #0

## 2021-05-16 MED ORDER — LISDEXAMFETAMINE DIMESYLATE 30 MG PO CAPS
30.0000 mg | ORAL_CAPSULE | Freq: Every day | ORAL | 0 refills | Status: DC
  Filled 2021-07-01: qty 30, 30d supply, fill #0

## 2021-05-16 MED ORDER — LISDEXAMFETAMINE DIMESYLATE 30 MG PO CAPS
30.0000 mg | ORAL_CAPSULE | Freq: Every day | ORAL | 0 refills | Status: DC
  Filled 2021-05-16: qty 30, 30d supply, fill #0

## 2021-05-16 NOTE — Patient Instructions (Signed)
Please return in 6 weeks for recheck mood.   If you have any questions or concerns, please don't hesitate to send me a message via MyChart or call the office at [phone]. Thank you for visiting with [place] today! It's our pleasure caring for you.

## 2021-05-16 NOTE — Progress Notes (Signed)
Subjective  CC:  Chief Complaint  Patient presents with   ADD   Depression    Moods have worse she would say    HPI: [name] is a 40 y.o. female who presents to the office today to address the problems listed above in the chief complaint, mood problems. Depression / anxiety: 6 month f/u. Feeling overwhelmed again. Had been pretty well controlled on lexapro 6 months ago. However, now struggling due to high demands at home and work. Little support at home. See screens below, positive.  Depression screen [facility] 2/9 05/16/2021 11/09/2020 07/31/2020  Decreased Interest 2 2 1   Down, Depressed, Hopeless 2 1 1   PHQ - 2 Score 4 3 2   Altered sleeping 2 2 3   Tired, decreased energy 2 2 3   Change in appetite 1 1 1   Feeling bad or failure about yourself  2 2 1   Trouble concentrating 2 1 1   Moving slowly or fidgety/restless 0 0 0  Suicidal thoughts - 0 0  PHQ-9 Score 13 11 11   Difficult doing work/chores Very difficult Very difficult Somewhat difficult  Some recent data might be hidden   GAD 7 : Generalized Anxiety Score 07/31/2020 04/13/2020 02/10/2020 06/23/2018  Nervous, Anxious, on Edge 1 2 3 2   Control/stop worrying 1 2 3 2   Worry too much - different things 1 1 3 2   Trouble relaxing 1 1 2 2   Restless 0 0 0 0  Easily annoyed or irritable 1 2 2 3   Afraid - awful might happen 1 2 3 1   Total GAD 7 Score 6 10 16 12   Anxiety Difficulty Somewhat difficult Extremely difficult Very difficult Very difficult   ADD: stable on meds. Still helpful.   Assessment  1. Attention deficit disorder (ADD) without hyperactivity   2. Moderate recurrent major depression (HCC)   3. Need for immunization against influenza   4. Class 3 severe obesity with body mass index (BMI) of 40.0 to 44.9 in adult, unspecified obesity type, unspecified whether serious comorbidity present (HCC)      Plan  Depression/anxiety:  moderate and worsening. Counseling at length. Add wellbutrin 150 to lexapro 20. Education  given. Close f/u. Will reconsider therapy once improving. Self care discussed.  ADD: stable refilled x 3 months.  Reviewed concept of mood problems caused by biochemical imbalance of neurotransmitters and rationale for treatment with medications and therapy.  Counseling given: pt was instructed to contact office, on-call physician or crisis Hotline if symptoms worsen significantly. If patient develops any suicidal or homicidal thoughts, she is directed to the ER immediately.   Follow up: Return in about 6 weeks (around 06/27/2021) for mood follow up.  Orders Placed This Encounter  Procedures   Flu Vaccine QUAD 33mo+IM (Fluarix, Fluzone & Alfiuria Quad PF)   Meds ordered this encounter  Medications   lisdexamfetamine (VYVANSE) 30 MG capsule    Sig: Take 1 capsule (30 mg total) by mouth daily.    Dispense:  30 capsule    Refill:  0   buPROPion (WELLBUTRIN XL) 150 MG 24 hr tablet    Sig: Take 1 tablet (150 mg total) by mouth daily.    Dispense:  90 tablet    Refill:  3   lisdexamfetamine (VYVANSE) 30 MG capsule    Sig: Take 1 capsule (30 mg total) by mouth daily.    Dispense:  30 capsule    Refill:  0   lisdexamfetamine (VYVANSE) 30 MG capsule    Sig: Take  1 capsule (30 mg total) by mouth daily.    Dispense:  30 capsule    Refill:  0      I reviewed the patients updated PMH, FH, and SocHx.    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 (Patient taking differently: Take 0.5 mg by mouth daily as needed for anxiety. Has never taken, but has it just in case)   buPROPion (WELLBUTRIN XL) 150 MG 24 hr tablet Take 1 tablet (150 mg total) by mouth daily.   escitalopram (LEXAPRO) 20 MG tablet TAKE 1 TABLET BY MOUTH DAILY.   [DISCONTINUED] lisdexamfetamine (VYVANSE) 30 MG capsule TAKE 1 CAPSULE BY MOUTH DAILY    Allergies: Patient is allergic to codeine. Family history:  Patient family history includes ADD / ADHD in her son; Anemia in her mother; Anxiety disorder in her mother and son; Cancer in her maternal grandmother; Depression in her mother; GER disease in her mother; Heart attack in her father; ODD in her son; Peripheral vascular disease in her mother; Varicose Veins in her mother. Social History   Socioeconomic History   Marital status: Married    Spouse name: jeff   Number of children: 3   Years of education: Not on file   Highest education level: Not on file  Occupational History   Not on file  Tobacco Use   Smoking status: Never   Smokeless tobacco: Never  Vaping Use   Vaping Use: Never used  Substance and Sexual Activity   Alcohol use: Yes    Comment: 1 q mth   Drug use: No   Sexual activity: Yes    Birth control/protection: Condom  Other Topics Concern   Not on file  Social History Narrative   Not on file   Social Determinants of Health   Financial Resource Strain: Not on file  Food Insecurity: Not on file  Transportation Needs: Not on file  Physical Activity: Not on file  Stress: Not on file  Social Connections: Not on file     Review of Systems: Constitutional: Negative for fever malaise or anorexia Cardiovascular: negative for chest pain Respiratory: negative for SOB or  persistent cough Gastrointestinal: negative for abdominal pain  Objective  Vitals: BP 98/80   Pulse 83   Temp 97.9 F (36.6 C) (Temporal)   Ht 5\' 3"  (1.6 m)   Wt 265 lb (120.2 kg)   LMP 05/13/2021 (Exact Date)   SpO2 97%   BMI 46.94 kg/m  General: no acute distress, well appearing, no apparent distress, well groomed Psych:  Alert and oriented x 3,depressed. depressed affect   Commons side effects, risks, benefits, and alternatives for medications and treatment plan prescribed today were discussed, and the patient expressed understanding of the given instructions. Patient is instructed to  call or message via MyChart if he/she has any questions or concerns regarding our treatment plan. No barriers to understanding were identified. We discussed Red Flag symptoms and signs in detail. Patient expressed understanding regarding what to do in case of urgent or emergency type symptoms.  Medication list was reconciled, printed and provided to the patient in AVS. Patient instructions and summary information was reviewed with the patient as documented in the AVS. This note was prepared with assistance of Dragon voice recognition software. Occasional wrong-word or sound-a-like substitutions may have occurred due to the inherent limitations of voice recognition software

## 2021-06-22 ENCOUNTER — Other Ambulatory Visit (HOSPITAL_COMMUNITY): Payer: Self-pay

## 2021-07-01 ENCOUNTER — Other Ambulatory Visit (HOSPITAL_COMMUNITY): Payer: Self-pay

## 2021-07-03 ENCOUNTER — Other Ambulatory Visit: Payer: Self-pay

## 2021-07-03 ENCOUNTER — Ambulatory Visit (INDEPENDENT_AMBULATORY_CARE_PROVIDER_SITE_OTHER): Payer: No Typology Code available for payment source | Admitting: Family Medicine

## 2021-07-03 ENCOUNTER — Encounter: Payer: Self-pay | Admitting: Family Medicine

## 2021-07-03 ENCOUNTER — Other Ambulatory Visit (HOSPITAL_COMMUNITY): Payer: Self-pay

## 2021-07-03 VITALS — BP 100/58 | HR 96 | Temp 97.7°F | Ht 63.0 in | Wt 264.0 lb

## 2021-07-03 DIAGNOSIS — F988 Other specified behavioral and emotional disorders with onset usually occurring in childhood and adolescence: Secondary | ICD-10-CM

## 2021-07-03 DIAGNOSIS — F331 Major depressive disorder, recurrent, moderate: Secondary | ICD-10-CM | POA: Diagnosis not present

## 2021-07-03 MED ORDER — LISDEXAMFETAMINE DIMESYLATE 30 MG PO CAPS
30.0000 mg | ORAL_CAPSULE | Freq: Every day | ORAL | 0 refills | Status: DC
  Filled 2021-09-16: qty 30, 30d supply, fill #0

## 2021-07-03 MED ORDER — ESCITALOPRAM OXALATE 20 MG PO TABS
20.0000 mg | ORAL_TABLET | Freq: Every day | ORAL | 3 refills | Status: DC
  Filled 2021-07-03 – 2021-09-16 (×2): qty 90, 90d supply, fill #0
  Filled 2021-12-13: qty 30, 30d supply, fill #1
  Filled 2022-01-30: qty 30, 30d supply, fill #2
  Filled 2022-02-24: qty 30, 30d supply, fill #3
  Filled 2022-04-10: qty 30, 30d supply, fill #4
  Filled 2022-05-17: qty 30, 30d supply, fill #5

## 2021-07-03 MED ORDER — LISDEXAMFETAMINE DIMESYLATE 30 MG PO CAPS: 30.0000 mg | ORAL_CAPSULE | Freq: Every day | ORAL | 0 refills | Status: DC

## 2021-07-03 NOTE — Progress Notes (Signed)
Subjective  CC:  Chief Complaint  Patient presents with   Depression    HPI: [name] is a 41 y.o. female who presents to the office today to address the problems listed above in the chief complaint, mood problems. 6-week follow-up for depression.  Progress note please.  Significant depression and anxiety symptoms were active, added Wellbutrin to Lexapro.  Today she returns and reports she is feeling significantly better.  Her mood screens below show improvement.  No longer feeling overwhelmed down or unmotivated or anxious.  No adverse effects from medications.  Coping with work stress and parenting well.  Has no mood concerns  [facility] 2/9 07/03/2021 05/16/2021 11/09/2020  Decreased Interest 1 2 2   Down, Depressed, Hopeless 0 2 1  PHQ - 2 Score 1 4 3   Altered sleeping 1 2 2   Tired, decreased energy 0 2 2  Change in appetite 0 1 1  Feeling bad or failure about yourself  0 2 2  Trouble concentrating 1 2 1   Moving slowly or fidgety/restless 0 0 0  Suicidal thoughts 0 - 0  PHQ-9 Score 3 13 11   Difficult doing work/chores Not difficult at all Very difficult Very difficult  Some recent data might be hidden   GAD 7 : Generalized Anxiety Score 07/03/2021 07/31/2020 04/13/2020 02/10/2020  Nervous, Anxious, on Edge 1 1 2 3   Control/stop worrying 0 1 2 3   Worry too much - different things 0 1 1 3   Trouble relaxing 0 1 1 2   Restless 0 0 0 0  Easily annoyed or irritable 1 1 2 2   Afraid - awful might happen 0 1 2 3   Total GAD 7 Score 2 6 10 16   Anxiety Difficulty Not difficult at all Somewhat difficult Extremely difficult Very difficult   ADD follow-up: Continues to do well with medications.  No adverse effects.  Sleep is okay.  Assessment  1. Moderate recurrent major depression (HCC)   2. Attention deficit disorder (ADD) without hyperactivity      Plan  Depression: Depression anxiety are much improved.  Continue Lexapro 20 and Wellbutrin XR 150 mg daily.  She will  continue to monitor her symptoms. Reviewed concept of mood problems caused by biochemical imbalance of neurotransmitters and rationale for treatment with medications and therapy.  Counseling given: pt was instructed to contact office, on-call physician or crisis Hotline if symptoms worsen significantly. If patient develops any suicidal or homicidal thoughts, she is directed to the ER immediately.   ADD remains well controlled.  Refill Vyvanse, enough to get her through until her next physical  Follow up: 3 months for cpe and pap  No orders of the defined types were placed in this encounter.  Meds ordered this encounter  Medications   lisdexamfetamine (VYVANSE) 30 MG capsule    Sig: Take 1 capsule (30 mg total) by mouth daily.  **08/12/21**    Dispense:  30 capsule    Refill:  0   lisdexamfetamine (VYVANSE) 30 MG capsule    Sig: Take 1 capsule (30 mg total) by mouth daily.    Dispense:  30 capsule    Refill:  0   escitalopram (LEXAPRO) 20 MG tablet    Sig: Take 1 tablet (20 mg total) by mouth daily.    Dispense:  90 tablet    Refill:  3      I reviewed the patients updated PMH, FH, and SocHx.    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   buPROPion (WELLBUTRIN XL) 150 MG 24 hr tablet Take 1 tablet (150 mg total) by mouth daily.   [START ON 07/15/2021] lisdexamfetamine (VYVANSE) 30 MG capsule Take 1 capsule (30 mg total) by mouth daily.   [DISCONTINUED] escitalopram (LEXAPRO) 20 MG tablet TAKE 1 TABLET BY MOUTH DAILY.   [DISCONTINUED] lisdexamfetamine (VYVANSE) 30 MG capsule Take 1 capsule (30 mg total) by mouth daily.   [DISCONTINUED] lisdexamfetamine (VYVANSE) 30 MG capsule Take 1 capsule by mouth daily.    Allergies: Patient is allergic to codeine. Family history:  Patient family history includes ADD / ADHD in her son; Anemia in her mother; Anxiety disorder in her mother and son; Cancer in her maternal grandmother; Depression in her mother; GER disease in her mother; Heart attack in her father; ODD in her son; Peripheral vascular disease in her mother; Varicose Veins in her mother. Social History   Socioeconomic History   Marital status: Married    Spouse name: jeff   Number of children: 3   Years of education: Not on file   Highest education level: Not on file  Occupational History   Not on file  Tobacco Use   Smoking status: Never   Smokeless tobacco: Never  Vaping Use   Vaping Use: Never used  Substance and Sexual Activity   Alcohol use: Yes    Comment: 1 q mth   Drug use: No   Sexual activity: Yes    Birth control/protection: Condom  Other Topics Concern   Not on file  Social History Narrative   Not on file   Social Determinants of Health   Financial Resource Strain: Not on file  Food Insecurity: Not on file  Transportation Needs: Not on file  Physical Activity: Not on file  Stress: Not on file  Social Connections: Not on file     Review of Systems: Constitutional: Negative for fever malaise or anorexia Cardiovascular: negative for chest pain Respiratory: negative for SOB or persistent cough Gastrointestinal: negative for abdominal pain  Objective  Vitals: BP (!)  100/58    Pulse 96    Temp 97.7 F (36.5 C) (Temporal)    Ht 5\' 3"  (1.6 m)    Wt 264 lb (119.7 kg)    SpO2 96%    BMI 46.77 kg/m  General: no acute distress, well appearing, no apparent distress, well groomed Psych:  Alert and oriented x 3,normal mood, behavior, speech, dress, and thought processes. Looks happy and bright    Commons side effects, risks, benefits, and alternatives for medications and treatment plan prescribed today were discussed, and the patient expressed understanding of the given instructions. Patient is instructed to call or message via MyChart if he/she has any questions or concerns regarding our treatment plan. No barriers to  understanding were identified. We discussed Red Flag symptoms and signs in detail. Patient expressed understanding regarding what to do in case of urgent or emergency type symptoms.  Medication list was reconciled, printed and provided to the patient in AVS. Patient instructions and summary information was reviewed with the patient as documented in the AVS. This note was prepared with assistance of Dragon voice recognition software. Occasional wrong-word or sound-a-like substitutions may have occurred due to the inherent limitations of voice recognition software

## 2021-07-03 NOTE — Patient Instructions (Signed)
Please return in 3-4 months for your complete physical.   I"m so gald you are feeling better.  I refilled your lexapro and sent in a few more months of the vyvanse.   If you have any questions or concerns, please don't hesitate to send me a message via MyChart or call the office at [phone]. Thank you for visiting with [place] today! It's our pleasure caring for you.

## 2021-08-09 ENCOUNTER — Other Ambulatory Visit (HOSPITAL_COMMUNITY): Payer: Self-pay

## 2021-08-10 ENCOUNTER — Other Ambulatory Visit (HOSPITAL_COMMUNITY): Payer: Self-pay

## 2021-08-18 ENCOUNTER — Other Ambulatory Visit (HOSPITAL_COMMUNITY): Payer: Self-pay

## 2021-08-19 ENCOUNTER — Other Ambulatory Visit (HOSPITAL_COMMUNITY): Payer: Self-pay

## 2021-08-27 ENCOUNTER — Other Ambulatory Visit (HOSPITAL_COMMUNITY): Payer: Self-pay

## 2021-08-27 MED ORDER — CARESTART COVID-19 HOME TEST VI KIT
PACK | 0 refills | Status: DC
  Filled 2021-08-27: qty 2, 4d supply, fill #0

## 2021-09-16 ENCOUNTER — Other Ambulatory Visit (HOSPITAL_COMMUNITY): Payer: Self-pay

## 2021-10-20 ENCOUNTER — Other Ambulatory Visit: Payer: Self-pay | Admitting: Family Medicine

## 2021-10-21 ENCOUNTER — Other Ambulatory Visit (HOSPITAL_COMMUNITY): Payer: Self-pay

## 2021-10-21 MED ORDER — LISDEXAMFETAMINE DIMESYLATE 30 MG PO CAPS
30.0000 mg | ORAL_CAPSULE | Freq: Every day | ORAL | 0 refills | Status: DC
  Filled 2021-10-21: qty 30, 30d supply, fill #0

## 2021-10-29 ENCOUNTER — Other Ambulatory Visit (HOSPITAL_COMMUNITY): Payer: Self-pay

## 2021-10-29 ENCOUNTER — Encounter: Payer: Self-pay | Admitting: Family Medicine

## 2021-10-29 ENCOUNTER — Ambulatory Visit (INDEPENDENT_AMBULATORY_CARE_PROVIDER_SITE_OTHER): Payer: Commercial Managed Care - PPO | Admitting: Family Medicine

## 2021-10-29 VITALS — BP 110/70 | HR 78 | Temp 98.1°F | Ht 63.0 in | Wt 261.6 lb

## 2021-10-29 DIAGNOSIS — I351 Nonrheumatic aortic (valve) insufficiency: Secondary | ICD-10-CM | POA: Diagnosis not present

## 2021-10-29 DIAGNOSIS — Z0000 Encounter for general adult medical examination without abnormal findings: Secondary | ICD-10-CM

## 2021-10-29 DIAGNOSIS — H9193 Unspecified hearing loss, bilateral: Secondary | ICD-10-CM | POA: Diagnosis not present

## 2021-10-29 DIAGNOSIS — F988 Other specified behavioral and emotional disorders with onset usually occurring in childhood and adolescence: Secondary | ICD-10-CM | POA: Diagnosis not present

## 2021-10-29 DIAGNOSIS — F331 Major depressive disorder, recurrent, moderate: Secondary | ICD-10-CM

## 2021-10-29 LAB — TSH: TSH: 1.9 u[IU]/mL (ref 0.35–5.50)

## 2021-10-29 LAB — COMPREHENSIVE METABOLIC PANEL
ALT: 15 U/L (ref 0–35)
AST: 15 U/L (ref 0–37)
Albumin: 4 g/dL (ref 3.5–5.2)
Alkaline Phosphatase: 71 U/L (ref 39–117)
BUN: 20 mg/dL (ref 6–23)
CO2: 29 mEq/L (ref 19–32)
Calcium: 8.6 mg/dL (ref 8.4–10.5)
Chloride: 102 mEq/L (ref 96–112)
Creatinine, Ser: 0.76 mg/dL (ref 0.40–1.20)
GFR: 98.71 mL/min (ref >60.00)
Glucose, Bld: 94 mg/dL (ref 70–99)
Potassium: 3.8 mEq/L (ref 3.5–5.1)
Sodium: 139 mEq/L (ref 135–145)
Total Bilirubin: 0.4 mg/dL (ref 0.2–1.2)
Total Protein: 7.2 g/dL (ref 6.0–8.3)

## 2021-10-29 LAB — LIPID PANEL
Cholesterol: 156 mg/dL (ref 0–200)
HDL: 47.1 mg/dL (ref >39.00)
LDL Cholesterol: 89 mg/dL (ref 0–99)
NonHDL: 109.27
Total CHOL/HDL Ratio: 3
Triglycerides: 102 mg/dL (ref 0.0–149.0)
VLDL: 20.4 mg/dL (ref 0.0–40.0)

## 2021-10-29 LAB — CBC WITH DIFFERENTIAL/PLATELET
Basophils Absolute: 0 10*3/uL (ref 0.0–0.1)
Basophils Relative: 0.5 % (ref 0.0–3.0)
Eosinophils Absolute: 0.1 10*3/uL (ref 0.0–0.7)
Eosinophils Relative: 1.1 % (ref 0.0–5.0)
HCT: 32.9 % — ABNORMAL LOW (ref 36.0–46.0)
Hemoglobin: 10.6 g/dL — ABNORMAL LOW (ref 12.0–15.0)
Lymphocytes Relative: 25.2 % (ref 12.0–46.0)
Lymphs Abs: 1.6 10*3/uL (ref 0.7–4.0)
MCHC: 32.3 g/dL (ref 30.0–36.0)
MCV: 81.6 fl (ref 78.0–100.0)
Monocytes Absolute: 0.4 10*3/uL (ref 0.1–1.0)
Monocytes Relative: 6.4 % (ref 3.0–12.0)
Neutro Abs: 4.4 10*3/uL (ref 1.4–7.7)
Neutrophils Relative %: 66.8 % (ref 43.0–77.0)
Platelets: 249 10*3/uL (ref 150.0–400.0)
RBC: 4.04 Mil/uL (ref 3.87–5.11)
RDW: 16.1 % — ABNORMAL HIGH (ref 11.5–15.5)
WBC: 6.5 10*3/uL (ref 4.0–10.5)

## 2021-10-29 MED ORDER — LISDEXAMFETAMINE DIMESYLATE 30 MG PO CAPS
30.0000 mg | ORAL_CAPSULE | Freq: Every day | ORAL | 0 refills | Status: DC
  Filled 2021-11-21: qty 30, 30d supply, fill #0

## 2021-10-29 MED ORDER — LISDEXAMFETAMINE DIMESYLATE 30 MG PO CAPS
30.0000 mg | ORAL_CAPSULE | Freq: Every day | ORAL | 0 refills | Status: DC
  Filled 2022-01-30: qty 30, 30d supply, fill #0

## 2021-10-29 MED ORDER — LISDEXAMFETAMINE DIMESYLATE 30 MG PO CAPS
30.0000 mg | ORAL_CAPSULE | Freq: Every day | ORAL | 0 refills | Status: DC
  Filled 2021-12-30: qty 30, 30d supply, fill #0

## 2021-10-29 NOTE — Patient Instructions (Signed)
Please return in 6 months for ADD recheck  Please schedule with Dr. Billy Coast for your female wellness exam with pap.   We will call you with a new ENT referral appointment.   I will release your lab results to you on your MyChart account with further instructions. You may see the results before I do, but when I review them I will send you a message with my report or have my assistant call you if things need to be discussed. Please reply to my message with any questions. Thank you!   I have sent in 3 months of vyvanse for you. Please message me when you need the next 3 months refills.   If you have any questions or concerns, please don't hesitate to send me a message via MyChart or call the office at [phone]. Thank you for visiting with [place] today! It's our pleasure caring for you.

## 2021-10-29 NOTE — Progress Notes (Addendum)
Subjective  Chief Complaint  Patient presents with   Annual Exam    Pt here for Annual exam and is not currently fasting. Pt will schedule to have pap done with Gyno. Pt would also like to talk about possible getting some hearing aids    HPI: [name] is a 41 y.o. female who presents to [facility] [place] [facility] today for a Female Wellness Visit.  She also has the concerns and/or needs as listed above in the chief complaint. These will be addressed in addition to the Health Maintenance Visit.   Wellness Visit: annual visit with health maintenance review and exam without Pap  Health maintenance: Patient is due for Pap smear and will schedule with her GYN. Chronic disease management visit and/or acute problem visit: ADD: She continues on Vyvanse 30 mg daily and feels it is working very well.  Has no concerns with medications.  Due for refill. Moderate recurrent depression: Remains stable on Lexapro and Wellbutrin.  She continues with a therapist.  They both noticed that her symptoms tend to worsen prior to her menstrual cycle.  Considering PMDD.  She has if there are any hormonal treatments that could be helpful.  Her period was last week and she was a bit lower and mood.  Today she feels great.  Home stressors continue but she is coping well. Bilateral hearing loss: She is deaf in her left ear has hearing loss on her right.  She had seen Dr. Thornell Mule in the past who recommended a possible surgery.  She would like to be reevaluated again but needs to find a new ENT if possible. Morbid obesity: Maintaining a low-fat diet is difficult. 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 Hasn't had echo since 2013. asymptomatic  Assessment  1. Annual physical exam   2. Attention deficit disorder (ADD) without hyperactivity   3. Moderate recurrent major depression ([place])   4. Morbid obesity ([place])   5. Bilateral hearing loss, unspecified hearing loss type   6.  Nonrheumatic aortic valve insufficiency      Plan  Female Wellness Visit: Age appropriate Health Maintenance and Prevention measures were discussed with patient. Included topics are cancer screening recommendations, ways to keep healthy (see AVS) including dietary and exercise recommendations, regular eye and dental care, use of seat belts, and avoidance of moderate alcohol use and tobacco use. Rec pap with gyn soon.  BMI: discussed patient's BMI and encouraged positive lifestyle modifications to help get to or maintain a target BMI. HM needs and immunizations were addressed and ordered. See below for orders. See HM and immunization section for updates. Routine labs and screening tests ordered including cmp, cbc and lipids where appropriate. Discussed recommendations regarding Vit D and calcium supplementation (see AVS)  Chronic disease f/u and/or acute problem visit: (deemed necessary to be done in addition to the wellness visit): Depression and possible pmdd: Well-controlled, continue Lexapro 20 daily and Wellbutrin XL 150 mg daily.  Discussed hormonal treatments for PMDD.  She will discuss with gynecology.  Counseling done on behavioral management strategies for cyclical nature of her mood symptoms. Hearing loss: Refer to ENT, [facility].  Needs another audiologic evaluation. ADD remains well controlled on Vyvanse.  Refills placed for 3 months.  Recommend her messaging me when she needs refills in 3 months. AI: monitor clinically.   Follow up: 6 months for follow-up ADD  Orders Placed This Encounter  Procedures   CBC with Differential/Platelet  Comp Met (CMET)   Lipid Profile   TSH   Ambulatory referral to ENT   Meds ordered this encounter  Medications   lisdexamfetamine (VYVANSE) 30 MG capsule    Sig: Take 1 capsule by mouth daily.    Dispense:  30 capsule    Refill:  0   lisdexamfetamine (VYVANSE) 30 MG capsule    Sig: Take 1 capsule by mouth daily.    Dispense:  30  capsule    Refill:  0   lisdexamfetamine (VYVANSE) 30 MG capsule    Sig: Take 1 capsule by mouth daily.    Dispense:  30 capsule    Refill:  0       Body mass index is 46.34 kg/m. 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   Health Maintenance  Topic Date Due   PAP SMEAR-Modifier  06/10/2019   COVID-19 Vaccine (4 - Booster for Pfizer series) 11/14/2021 (Originally 07/20/2020)   INFLUENZA VACCINE  01/07/2022   TETANUS/TDAP  12/08/2026   Hepatitis C Screening  Completed   HIV Screening  Completed   HPV VACCINES  Aged Out   Immunization History  Administered Date(s) Administered   Influenza Split 07/30/2012   Influenza,inj,Quad PF,6+ Mos 04/13/2020, 05/16/2021   Moderna Sars-Covid-2 Vaccination 05/25/2020   PFIZER(Purple Top)SARS-COV-2 Vaccination 09/08/2019, 10/03/2019   We updated and reviewed the patient's past history in detail and it is documented below. Allergies: Patient  reports current alcohol use. Past Medical History Patient  has a past medical history of Anemia, Anti-Duffy antibodies present, Deafness, Deafness in left ear, Depression, ELECTROCARDIOGRAM, ABNORMAL  (12/28/2008), Fractured coccyx ([place]), GERD (gastroesophageal reflux disease), Heart murmur, History of postpartum depression (03/23/2018), varicella, Personal history of congenital hip dysplasia (03/23/2018), and Subaortic stenosis. Past Surgical History Patient  has a past surgical history that includes Cardiac surgery; Cesarean section; Cesarean section (N/A, 07/28/2012); Cesarean section (N/A, 03/26/2017); and Eye surgery. Social History   Socioeconomic History   Marital status: Married    Spouse name: jeff   Number of children: 3   Years of education: Not on file   Highest education level: Not on file  Occupational History   Not on file  Tobacco Use   Smoking status: Never   Smokeless tobacco: Never  Vaping Use   Vaping Use: Never used  Substance and Sexual Activity   Alcohol use: Yes    Comment: 1 q mth   Drug use: No   Sexual activity: Yes    Birth control/protection: Condom  Other Topics Concern   Not on file  Social History Narrative   Not on file   Social Determinants of Health   Financial Resource Strain: Not on file  Food  Insecurity: Not on file  Transportation Needs: Not on file  Physical Activity: Not on file  Stress: Not on file  Social Connections: Not on file   Family History  Problem Relation Age of Onset   Peripheral vascular disease Mother    Varicose Veins Mother    Anemia Mother    Depression Mother    Anxiety disorder Mother    GER disease Mother    Cancer Maternal Grandmother        breast   Heart attack Father    Anxiety disorder Son    ODD Son    ADD / ADHD Son     Review of Systems: Constitutional: negative for fever or malaise Ophthalmic: negative for photophobia, double vision or loss of vision Cardiovascular: negative for chest pain, dyspnea on exertion, or new LE swelling Respiratory: negative for SOB or persistent cough Gastrointestinal: negative for abdominal pain, change in bowel habits or melena Genitourinary: negative for  dysuria or gross hematuria, no abnormal uterine bleeding or disharge Musculoskeletal: negative for new gait disturbance or muscular weakness Integumentary: negative for new or persistent rashes, no breast lumps Neurological: negative for TIA or stroke symptoms Psychiatric: negative for SI or delusions Allergic/Immunologic: negative for hives  Patient Care Team    Relationship Specialty Notifications Start End  Leamon Arnt, MD PCP - General Family Medicine  03/23/18   Brien Few, MD Consulting Physician Obstetrics and Gynecology  03/23/18   Bjorn Loser, MD Consulting Physician Urology  06/23/18     Objective  Vitals: BP 110/70   Pulse 78   Temp 98.1 F (36.7 C)   Ht _0  (1.6 m)   Wt 261 lb 9.6 oz (118.7 kg)   SpO2 98%   BMI 46.34 kg/m  General:  Well developed, well nourished, no acute distress  Psych:  Alert and orientedx3,normal mood and affect HEENT:  Normocephalic, atraumatic, non-icteric sclera, PERRL, supple neck without adenopathy, mass or thyromegaly Cardiovascular:  Normal S1, S2, RRR without gallop, rub 2/6 systolic murmur present Respiratory:  Good breath sounds bilaterally, CTAB with normal respiratory effort Gastrointestinal: normal bowel sounds, soft, non-tender, no noted masses. No HSM MSK: no deformities, contusions. Joints are without erythema or swelling.  Skin:  Warm, no rashes or suspicious lesions noted Neurologic:    Mental status is normal. Gross motor and sensory exams are normal. Normal gait. No tremor    Commons side effects, risks, benefits, and alternatives for medications and treatment plan prescribed today were discussed, and the patient expressed understanding of the given instructions. Patient is instructed to call or message via MyChart if he/she has any questions or concerns regarding our treatment plan. No barriers to understanding were identified. We discussed Red Flag symptoms and signs in detail. Patient expressed understanding  regarding what to do in case of urgent or emergency type symptoms.  Medication list was reconciled, printed and provided to the patient in AVS. Patient instructions and summary information was reviewed with the patient as documented in the AVS. This note was prepared with assistance of Dragon voice recognition software. Occasional wrong-word or sound-a-like substitutions may have occurred due to the inherent limitations of voice recognition software  This visit occurred during the SARS-CoV-2 public health emergency.  Safety protocols were in place, including screening questions prior to the visit, additional usage of staff PPE, and extensive cleaning of exam room while observing appropriate contact time as indicated for disinfecting solutions.

## 2021-11-21 ENCOUNTER — Other Ambulatory Visit (HOSPITAL_COMMUNITY): Payer: Self-pay

## 2021-12-03 ENCOUNTER — Other Ambulatory Visit (HOSPITAL_COMMUNITY): Payer: Self-pay

## 2021-12-14 ENCOUNTER — Other Ambulatory Visit (HOSPITAL_COMMUNITY): Payer: Self-pay

## 2021-12-16 ENCOUNTER — Other Ambulatory Visit (HOSPITAL_COMMUNITY): Payer: Self-pay

## 2021-12-30 ENCOUNTER — Other Ambulatory Visit (HOSPITAL_COMMUNITY): Payer: Self-pay

## 2022-01-06 ENCOUNTER — Other Ambulatory Visit (HOSPITAL_COMMUNITY): Payer: Self-pay

## 2022-01-09 IMAGING — US US ABDOMEN LIMITED
1 series · 14 of 25 positions shown · non-contrast
Comparison: 12/27/2016.

CLINICAL DATA: Upper abdominal pain.

EXAM:
ULTRASOUND ABDOMEN LIMITED RIGHT UPPER QUADRANT

[Series 1: us abdomen limited ruq · 14 of 38 slices shown]
[im 1/38]
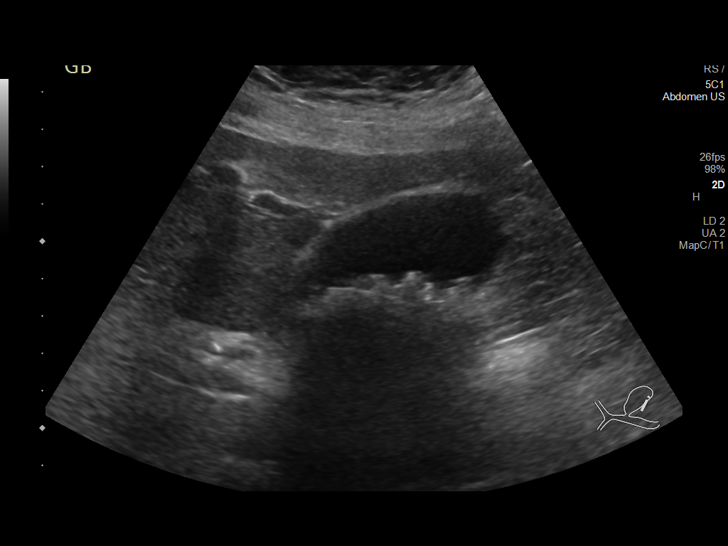
[im 4/38]
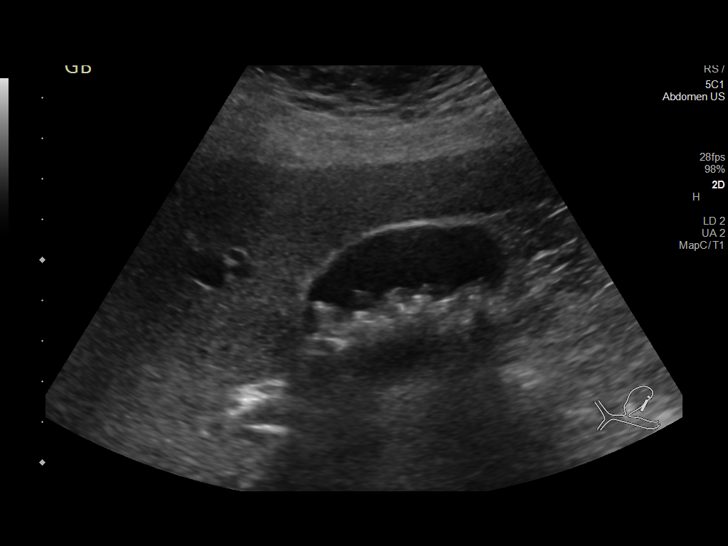
[im 7/38]
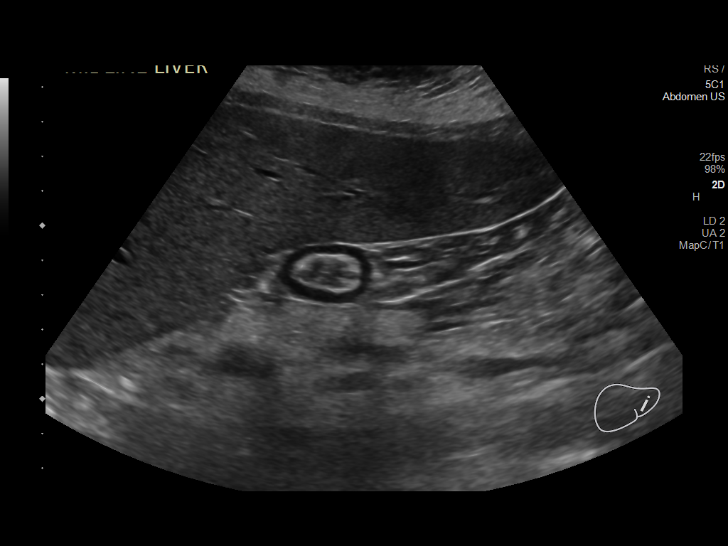
[im 10/38]
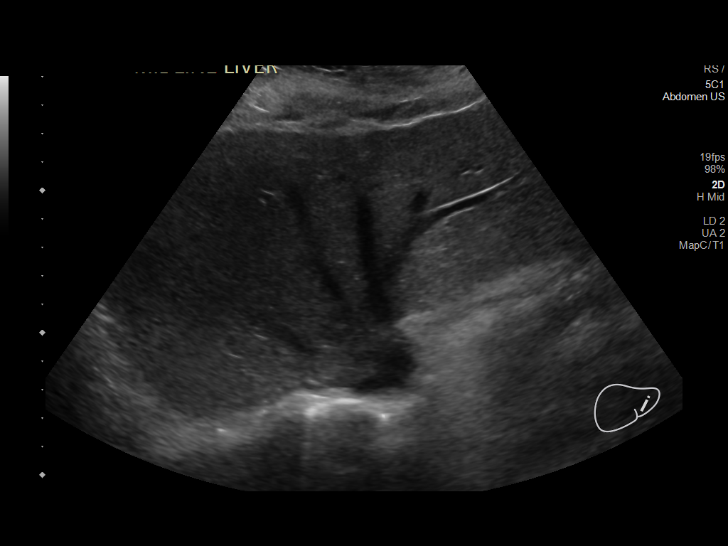
[im 13/38]
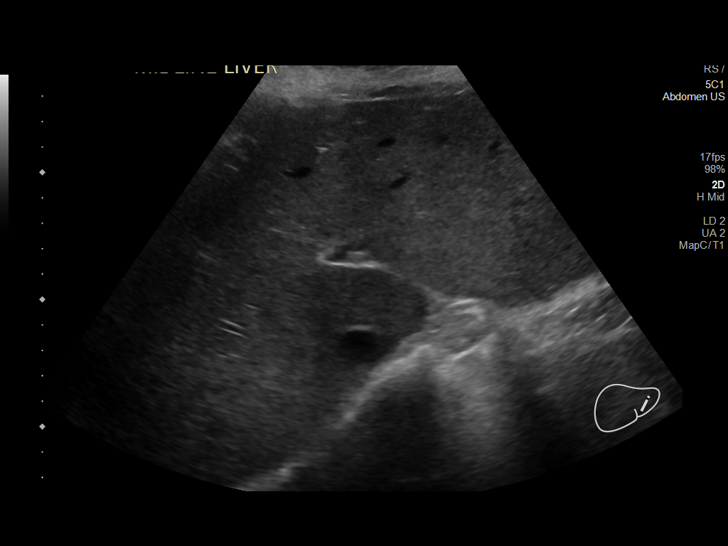
[im 14/38]
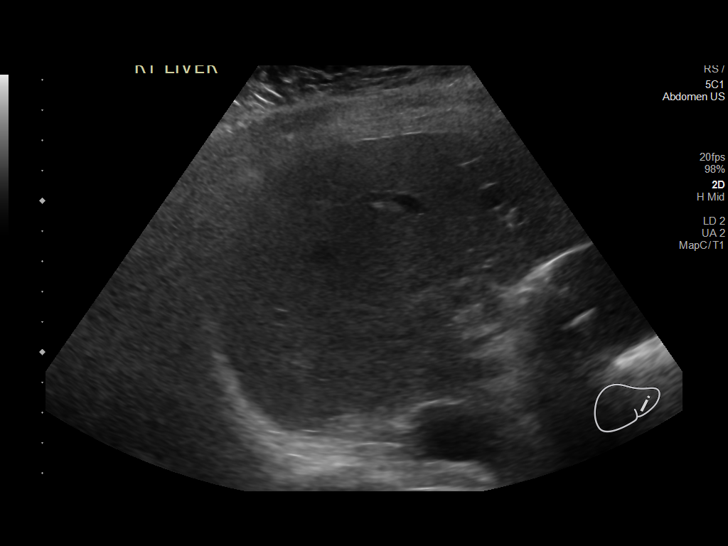
[im 17/38]
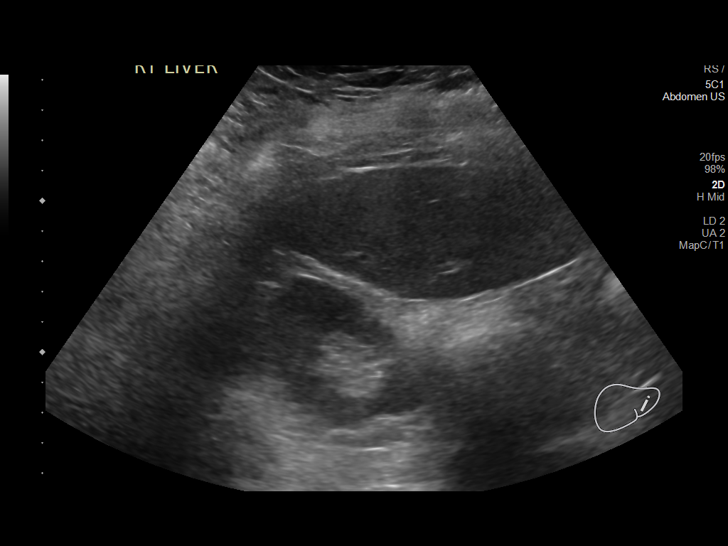
[im 21/38]
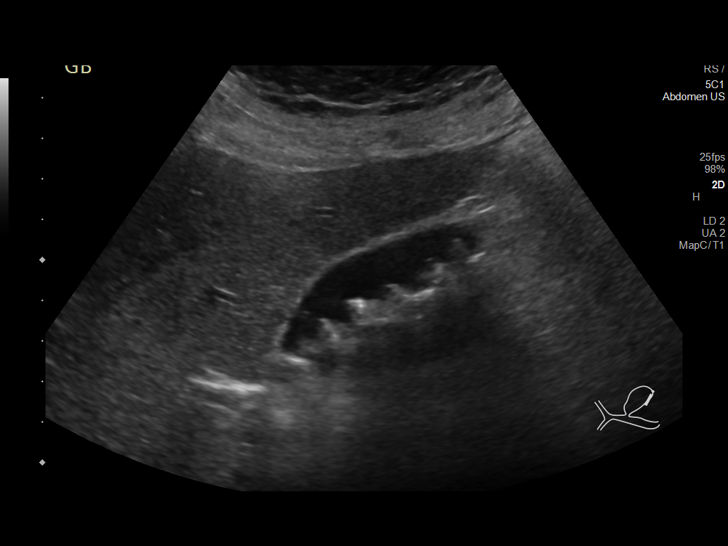
[im 24/38]
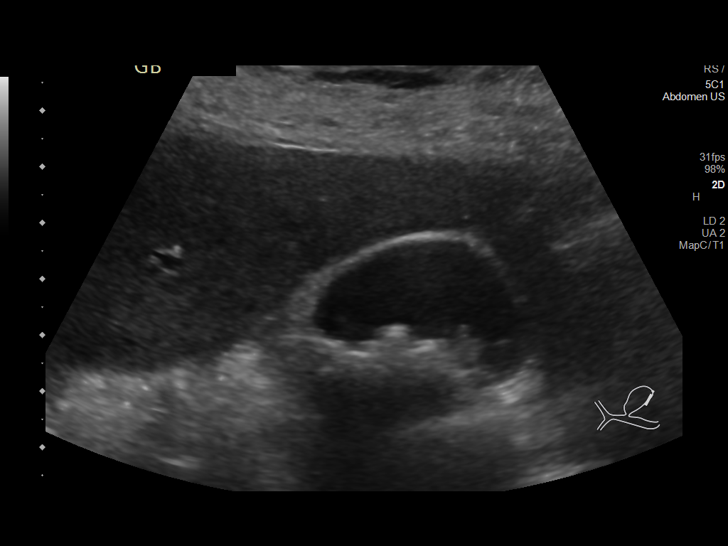
[im 25/38]
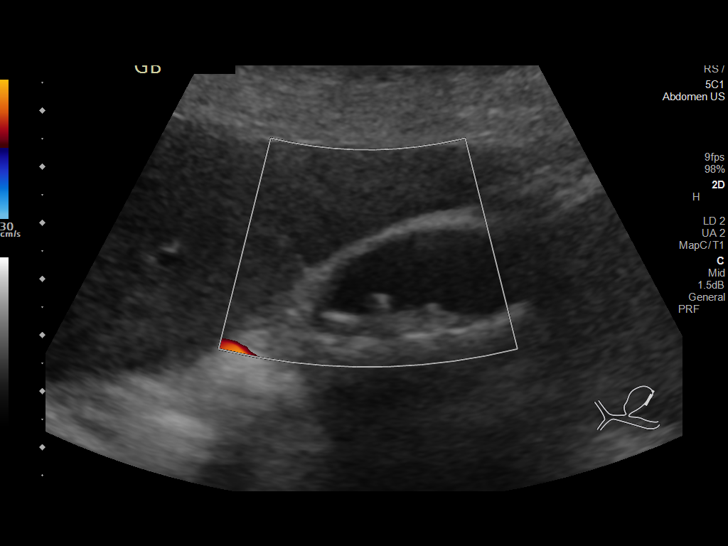
[im 28/38]
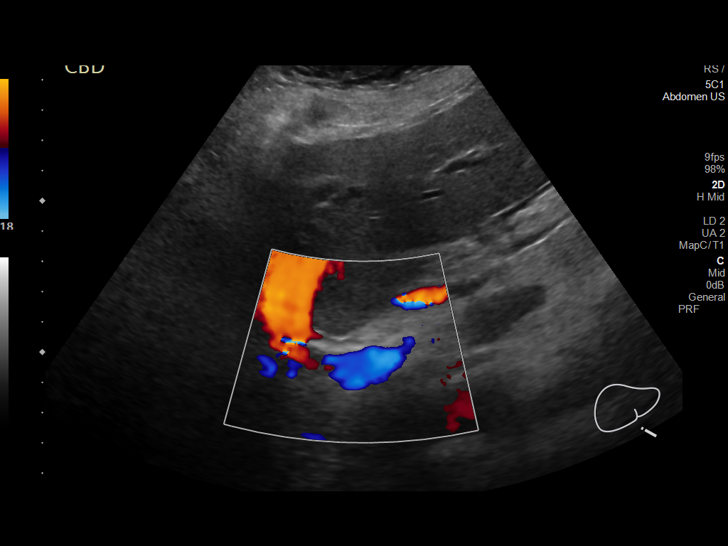
[im 31/38]
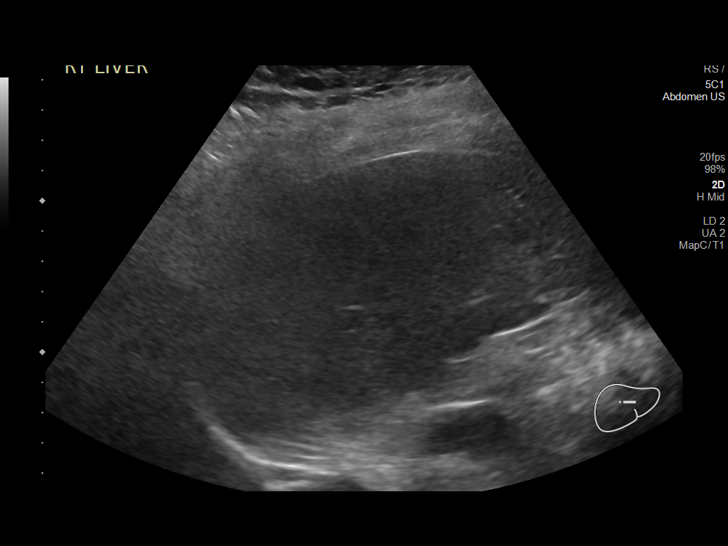
[im 34/38]
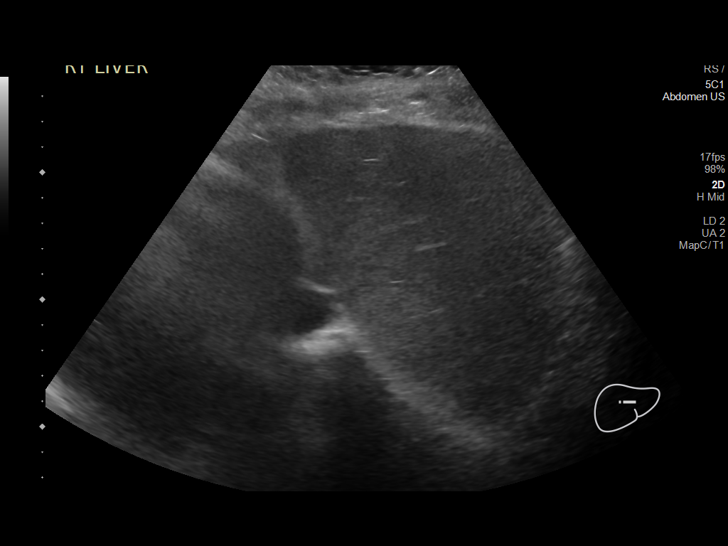
[im 38/38]
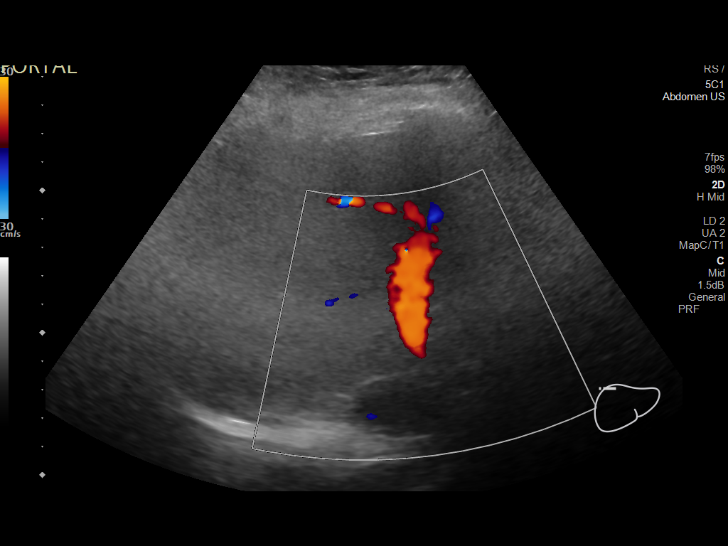

[14 of 25 positions shown; findings below may reference images not displayed]

FINDINGS: Gallbladder:

Multiple tiny gallstones again noted. Gallbladder wall thickness
normal. Negative Murphy sign.

Common bile duct:

Diameter: 3.2 mm

Liver:

No focal lesion identified. Increased echogenicity consistent with
fatty infiltration. Portal vein is patent on color Doppler imaging
with normal direction of blood flow towards the liver.

Other: Exam limited due to patient's body habitus.
IMPRESSION: Multiple tiny gallstones again noted. No evidence of cholecystitis
or biliary distention. 2. Increased hepatic echogenicity consistent
fatty infiltration or hepatocellular disease.

## 2022-01-30 ENCOUNTER — Other Ambulatory Visit (HOSPITAL_COMMUNITY): Payer: Self-pay

## 2022-02-06 ENCOUNTER — Other Ambulatory Visit (HOSPITAL_COMMUNITY): Payer: Self-pay

## 2022-02-24 ENCOUNTER — Other Ambulatory Visit (HOSPITAL_COMMUNITY): Payer: Self-pay

## 2022-02-25 ENCOUNTER — Other Ambulatory Visit (HOSPITAL_COMMUNITY): Payer: Self-pay

## 2022-02-28 ENCOUNTER — Other Ambulatory Visit (HOSPITAL_COMMUNITY): Payer: Self-pay

## 2022-03-03 ENCOUNTER — Encounter: Payer: Self-pay | Admitting: *Deleted

## 2022-03-03 ENCOUNTER — Encounter: Payer: Self-pay | Admitting: Family Medicine

## 2022-03-03 ENCOUNTER — Ambulatory Visit: Payer: Commercial Managed Care - PPO | Admitting: Family Medicine

## 2022-03-03 VITALS — BP 104/70 | HR 84 | Temp 98.7°F | Ht 63.0 in | Wt 260.2 lb

## 2022-03-03 DIAGNOSIS — Z23 Encounter for immunization: Secondary | ICD-10-CM

## 2022-03-03 DIAGNOSIS — R140 Abdominal distension (gaseous): Secondary | ICD-10-CM | POA: Diagnosis not present

## 2022-03-03 NOTE — Progress Notes (Signed)
Subjective  CC:  Chief Complaint  Patient presents with   concerns    Pt comes in today with some questions/concerns with ovarian cancer that she would like to get some answers to to make sure she does not have it.     HPI: [name] is a 41 y.o. female who presents to the office today to address the problems listed above in the chief complaint. 41 year old with concerns for ovarian cancer.  Monitor ovarian cancer awareness month and read many of the symptoms.  Complains of abdominal bloating, weight gain, intermittent abdominal pains.  Symptoms have been ongoing for the last couple of years.  Has been seen for the symptoms in the past and thought related to constipation or gallstones.  However she is concerned that she could have ovarian cancer.  She would like to be screened.  No family history.  Regular menstrual cycles.  Assessment  1. Abdominal bloating   2. Need for immunization against influenza      Plan  Abdominal bloating: Reassuring since symptoms are ongoing, intermittent and mild over the last several years.  Education given.  We will check ovarian/pelvic ultrasound and CA125.  She does have a gynecologist but he cannot see her until December.  If these tests are normal, it is unlikely that she has a problem and can treat her symptoms as GI related.  Patient understands and agrees with care plan.  Follow up:  as needed 05/05/2022  Orders Placed This Encounter  Procedures   US PELVIC COMPLETE WITH TRANSVAGINAL   Flu Vaccine QUAD 72mo+IM (Fluarix, Fluzone & Alfiuria Quad PF)   CA 125   No orders of the defined types were placed in this encounter.     I reviewed the patients updated PMH, FH, and SocHx.    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   escitalopram (LEXAPRO) 20 MG tablet Take 1 tablet by mouth daily.   lisdexamfetamine (VYVANSE) 30 MG capsule Take 1 capsule by mouth daily.   lisdexamfetamine (VYVANSE) 30 MG capsule Take 1 capsule by mouth daily.   lisdexamfetamine (VYVANSE) 30 MG capsule Take 1 capsule by mouth daily.    Allergies: Patient is allergic to codeine. Family History: Patient family history includes ADD / ADHD in her son; Anemia in her mother; Anxiety disorder in her mother and son; Cancer in her maternal grandmother; Depression in her mother; GER disease in her mother; Heart attack in her father; ODD in her son; Peripheral vascular disease in her mother; Varicose Veins in her mother. Social History:  Patient  reports that she has never smoked. She has never used smokeless tobacco. She reports current alcohol use. She reports that she does not use drugs.  Review of Systems: Constitutional: Negative for fever malaise or anorexia Cardiovascular: negative for chest pain Respiratory: negative for SOB or persistent cough Gastrointestinal: negative for abdominal pain  Objective  Vitals: BP 104/70   Pulse 84   Temp 98.7 F (37.1 C)   Ht 5\' 3"  (1.6 m)   Wt 260 lb 3.2 oz (118 kg)   SpO2 97%   BMI 46.09 kg/m  General: no acute distress , A&Ox3   Commons side effects, risks, benefits, and alternatives for medications and treatment plan prescribed today were discussed, and the patient expressed understanding of the given instructions. Patient is instructed to call or message via MyChart if  he/she has any questions or concerns regarding our treatment plan. No barriers to understanding were identified. We discussed Red Flag symptoms and signs in detail. Patient expressed understanding regarding what to do in case of urgent or emergency type symptoms.  Medication list was reconciled, printed and provided to the patient in AVS. Patient instructions and summary information was reviewed with the patient as documented in the AVS. This note was prepared with assistance of Dragon voice recognition software. Occasional wrong-word or sound-a-like substitutions may have occurred due to the inherent limitations of voice recognition software  This visit occurred during the SARS-CoV-2 public health emergency.  Safety protocols were in place, including screening questions prior to the visit, additional usage of staff PPE, and extensive cleaning of exam room while observing appropriate contact time as indicated for disinfecting solutions.

## 2022-03-03 NOTE — Patient Instructions (Signed)
Please follow up if symptoms do not improve or as needed.    We will call you to get you set up for a pelvic ultrasound. I will let you know what the blood test shows.

## 2022-03-04 LAB — CA 125: CA 125: 9 U/mL (ref <35)

## 2022-03-06 ENCOUNTER — Ambulatory Visit: Payer: Commercial Managed Care - PPO | Admitting: Family Medicine

## 2022-03-07 ENCOUNTER — Other Ambulatory Visit: Payer: Commercial Managed Care - PPO

## 2022-03-07 ENCOUNTER — Other Ambulatory Visit (HOSPITAL_COMMUNITY): Payer: Self-pay

## 2022-03-07 ENCOUNTER — Other Ambulatory Visit: Payer: Self-pay | Admitting: Family Medicine

## 2022-03-07 MED ORDER — LISDEXAMFETAMINE DIMESYLATE 30 MG PO CAPS
30.0000 mg | ORAL_CAPSULE | Freq: Every day | ORAL | 0 refills | Status: DC
  Filled 2022-03-07: qty 30, 30d supply, fill #0

## 2022-04-10 ENCOUNTER — Other Ambulatory Visit (HOSPITAL_COMMUNITY): Payer: Self-pay

## 2022-04-10 ENCOUNTER — Other Ambulatory Visit: Payer: Self-pay | Admitting: Family Medicine

## 2022-04-10 MED ORDER — LISDEXAMFETAMINE DIMESYLATE 30 MG PO CAPS
30.0000 mg | ORAL_CAPSULE | Freq: Every day | ORAL | 0 refills | Status: DC
  Filled 2022-04-10: qty 30, 30d supply, fill #0

## 2022-05-05 ENCOUNTER — Ambulatory Visit: Payer: Commercial Managed Care - PPO | Admitting: Family Medicine

## 2022-05-07 ENCOUNTER — Other Ambulatory Visit (HOSPITAL_COMMUNITY): Payer: Self-pay

## 2022-05-07 ENCOUNTER — Ambulatory Visit: Payer: Commercial Managed Care - PPO | Admitting: Family Medicine

## 2022-05-07 VITALS — BP 100/60 | HR 72 | Temp 98.7°F | Ht 63.0 in | Wt 270.4 lb

## 2022-05-07 DIAGNOSIS — F988 Other specified behavioral and emotional disorders with onset usually occurring in childhood and adolescence: Secondary | ICD-10-CM

## 2022-05-07 DIAGNOSIS — R0683 Snoring: Secondary | ICD-10-CM

## 2022-05-07 DIAGNOSIS — F331 Major depressive disorder, recurrent, moderate: Secondary | ICD-10-CM | POA: Diagnosis not present

## 2022-05-07 MED ORDER — LISDEXAMFETAMINE DIMESYLATE 30 MG PO CAPS
30.0000 mg | ORAL_CAPSULE | Freq: Every day | ORAL | 0 refills | Status: DC
  Filled 2022-05-07 – 2022-05-12 (×2): qty 30, 30d supply, fill #0

## 2022-05-07 MED ORDER — BUPROPION HCL ER (XL) 150 MG PO TB24
150.0000 mg | ORAL_TABLET | Freq: Every day | ORAL | 3 refills | Status: DC
  Filled 2022-05-07: qty 30, 30d supply, fill #0
  Filled 2022-07-14: qty 30, 30d supply, fill #1
  Filled 2022-08-13: qty 30, 30d supply, fill #2
  Filled 2022-10-03: qty 30, 30d supply, fill #3
  Filled 2022-11-12: qty 30, 30d supply, fill #4
  Filled 2022-12-15: qty 30, 30d supply, fill #5
  Filled 2023-02-02: qty 30, 30d supply, fill #6
  Filled 2023-03-09: qty 30, 30d supply, fill #7
  Filled 2023-04-07: qty 30, 30d supply, fill #8
  Filled ????-??-??: fill #1

## 2022-05-07 MED ORDER — LISDEXAMFETAMINE DIMESYLATE 30 MG PO CAPS
30.0000 mg | ORAL_CAPSULE | Freq: Every day | ORAL | 0 refills | Status: DC
  Filled 2022-10-03: qty 30, 30d supply, fill #0

## 2022-05-07 MED ORDER — LISDEXAMFETAMINE DIMESYLATE 30 MG PO CAPS: 30.0000 mg | ORAL_CAPSULE | Freq: Every day | ORAL | 0 refills | Status: DC

## 2022-05-07 NOTE — Patient Instructions (Signed)
Please return in 6 months for your annual complete physical; please come fasting.   Message me in 3 months for next Vyvanse refills.   Good luck with exercise and diet changes.   If you have any questions or concerns, please don't hesitate to send me a message via MyChart or call the office at [phone]. Thank you for visiting with [place] today! It's our pleasure caring for you.

## 2022-05-07 NOTE — Progress Notes (Signed)
Subjective  CC:  Chief Complaint  Patient presents with   ADHD    Pt here to F/U with ADD and also schedule for pap on 05/12/2022    HPI: [name] is a 41 y.o. female who presents to the office today to address the problems listed above in the chief complaint.  Patient is here today for follow up of ADD/ADHD. She is taking medication as directed and continues to feel it is beneficial. The medications continue to help with focus and attention and task completion. She denies adverse side effects; specifically no headaches, appetite suppression, weight loss, sleeping difficulty, heart palpitations, chest pain or significant weight changes.  She has noted that she is more tired than usual again. Started in September. Does correlate with increased stressors with children's needs/neurodivergent and son with high anxiety making schooling difficult. She feels her mood is mostly stable although she does feel the increased stress. Also had familial loss: sister lost pregnancy several months ago. They are all coping. No panic sxs continues on lexapro and wellbutrin. Needs refill  Increased weight and snoring; no h/o OSA, never checked   Wt Readings from Last 3 Encounters:  05/07/22 270 lb 6.4 oz (122.7 kg)  03/03/22 260 lb 3.2 oz (118 kg)  10/29/21 261 lb 9.6 oz (118.7 kg)      Assessment  1. Attention deficit disorder (ADD) without hyperactivity   2. Moderate recurrent major depression (HCC)   3. Morbid obesity (HCC)   4. Snoring      Plan  ADD:  continue vyvanse 30 daily. Fairly well controlled. I reviewed patient's records from the PMP aware controlled substance registry today.  Depression: fairly well controlled. Increased fatigue could be related to increase stressors. Pt to monitor and return if not improving.  Will work on exercise and weight loss to help manage depression, fatigue and mood Snoring: consider sleep apnea; will work on weight loss first. Can consider  testing if not improving.   Follow up: 6 mo for cpe, sooner if needed for fatigue or mood  No orders of the defined types were placed in this encounter.  Meds ordered this encounter  Medications   buPROPion (WELLBUTRIN XL) 150 MG 24 hr tablet    Sig: Take 1 tablet (150 mg total) by mouth daily.    Dispense:  90 tablet    Refill:  3   lisdexamfetamine (VYVANSE) 30 MG capsule    Sig: Take 1 capsule by mouth daily.    Dispense:  30 capsule    Refill:  0   lisdexamfetamine (VYVANSE) 30 MG capsule    Sig: Take 1 capsule by mouth daily.    Dispense:  30 capsule    Refill:  0   lisdexamfetamine (VYVANSE) 30 MG capsule    Sig: Take 1 capsule by mouth daily.    Dispense:  30 capsule    Refill:  0      I reviewed the patients updated PMH, FH, and SocHx.    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   [DISCONTINUED] buPROPion (WELLBUTRIN XL) 150 MG 24 hr tablet Take 1 tablet (150 mg total) by mouth daily.   [DISCONTINUED] lisdexamfetamine (VYVANSE) 30 MG capsule Take 1 capsule by mouth daily.   [DISCONTINUED] lisdexamfetamine (VYVANSE) 30 MG capsule Take 1 capsule by mouth daily.   [DISCONTINUED] lisdexamfetamine (VYVANSE) 30 MG capsule Take 1 capsule by mouth daily.    Allergies: Patient is allergic to codeine. Family History: Patient family history includes ADD / ADHD in her  son; Anemia in her mother; Anxiety disorder in her mother and son; Cancer in her maternal grandmother; Depression in her mother; GER disease in her mother; Heart attack in her father; ODD in her son; Peripheral vascular disease in her mother; Varicose Veins in her mother. Social History:  Patient  reports that she has never smoked. She has never used smokeless tobacco. She reports current alcohol use. She reports that she does not use drugs.  Review of Systems: Constitutional: Negative for fever malaise or anorexia Cardiovascular: negative for chest pain Respiratory: negative for SOB or persistent cough Gastrointestinal: negative for abdominal pain  Objective  Vitals: BP 100/60   Pulse 72   Temp 98.7 F (37.1 C)   Ht 5\' 3"  (1.6 m)   Wt 270 lb 6.4 oz (122.7 kg)   SpO2 98%   BMI 47.90 kg/m  General: no acute distress , A&Ox3 Psych: normal affect  Commons side effects, risks, benefits, and alternatives for medications and treatment plan prescribed today were discussed, and the patient expressed understanding of the given instructions. Patient is instructed to call or message via MyChart if he/she has any questions or concerns regarding our treatment plan. No barriers to understanding were identified. We discussed Red Flag symptoms and signs in detail. Patient expressed understanding regarding what to do in case of urgent or emergency type symptoms.  Medication list was reconciled, printed and provided to the patient in AVS. Patient instructions and summary information was reviewed with the patient as documented in the AVS. This note was prepared with assistance of Dragon voice recognition software. Occasional wrong-word or sound-a-like substitutions may have occurred due to the inherent limitations of voice recognition software

## 2022-05-12 ENCOUNTER — Other Ambulatory Visit (HOSPITAL_COMMUNITY): Payer: Self-pay

## 2022-05-17 ENCOUNTER — Other Ambulatory Visit (HOSPITAL_COMMUNITY): Payer: Self-pay

## 2022-05-23 LAB — HM PAP SMEAR: HM Pap smear: NORMAL

## 2022-06-25 ENCOUNTER — Other Ambulatory Visit (HOSPITAL_COMMUNITY): Payer: Self-pay

## 2022-06-25 ENCOUNTER — Other Ambulatory Visit: Payer: Self-pay | Admitting: Family Medicine

## 2022-06-25 MED ORDER — LISDEXAMFETAMINE DIMESYLATE 30 MG PO CAPS
30.0000 mg | ORAL_CAPSULE | Freq: Every day | ORAL | 0 refills | Status: DC
  Filled 2022-06-25: qty 30, 30d supply, fill #0

## 2022-07-14 ENCOUNTER — Other Ambulatory Visit (HOSPITAL_COMMUNITY): Payer: Self-pay

## 2022-07-14 ENCOUNTER — Other Ambulatory Visit: Payer: Self-pay | Admitting: Family Medicine

## 2022-07-14 MED ORDER — ESCITALOPRAM OXALATE 20 MG PO TABS
20.0000 mg | ORAL_TABLET | Freq: Every day | ORAL | 3 refills | Status: DC
  Filled 2022-07-14: qty 30, 30d supply, fill #0
  Filled 2022-08-13: qty 30, 30d supply, fill #1
  Filled 2022-10-03: qty 30, 30d supply, fill #2
  Filled 2022-11-12: qty 30, 30d supply, fill #3
  Filled 2022-12-15: qty 30, 30d supply, fill #4
  Filled 2023-02-02: qty 30, 30d supply, fill #5
  Filled 2023-03-09: qty 30, 30d supply, fill #6
  Filled 2023-04-07: qty 30, 30d supply, fill #7
  Filled 2023-05-14: qty 30, 30d supply, fill #8
  Filled 2023-06-16 – 2023-06-24 (×2): qty 30, 30d supply, fill #9
  Filled ????-??-??: fill #0

## 2022-08-13 ENCOUNTER — Other Ambulatory Visit (HOSPITAL_COMMUNITY): Payer: Self-pay

## 2022-08-13 ENCOUNTER — Other Ambulatory Visit: Payer: Self-pay | Admitting: Family Medicine

## 2022-08-13 ENCOUNTER — Other Ambulatory Visit: Payer: Self-pay

## 2022-08-13 MED ORDER — LISDEXAMFETAMINE DIMESYLATE 30 MG PO CAPS
30.0000 mg | ORAL_CAPSULE | Freq: Every day | ORAL | 0 refills | Status: DC
  Filled 2022-08-13: qty 30, 30d supply, fill #0

## 2022-08-15 ENCOUNTER — Other Ambulatory Visit (HOSPITAL_COMMUNITY): Payer: Self-pay

## 2022-08-18 ENCOUNTER — Other Ambulatory Visit (HOSPITAL_COMMUNITY): Payer: Self-pay

## 2022-08-22 ENCOUNTER — Other Ambulatory Visit (HOSPITAL_COMMUNITY): Payer: Self-pay

## 2022-08-25 ENCOUNTER — Other Ambulatory Visit (HOSPITAL_COMMUNITY): Payer: Self-pay

## 2022-08-28 ENCOUNTER — Other Ambulatory Visit (HOSPITAL_COMMUNITY): Payer: Self-pay

## 2022-09-02 ENCOUNTER — Other Ambulatory Visit (HOSPITAL_COMMUNITY): Payer: Self-pay

## 2022-10-03 ENCOUNTER — Other Ambulatory Visit (HOSPITAL_COMMUNITY): Payer: Self-pay

## 2022-10-07 IMAGING — DX DG ABDOMEN 1V
1 series · 1 of 1 positions shown · non-contrast
Comparison: None.

CLINICAL DATA: Loose stool

EXAM:
ABDOMEN - 1 VIEW

[kub ap]
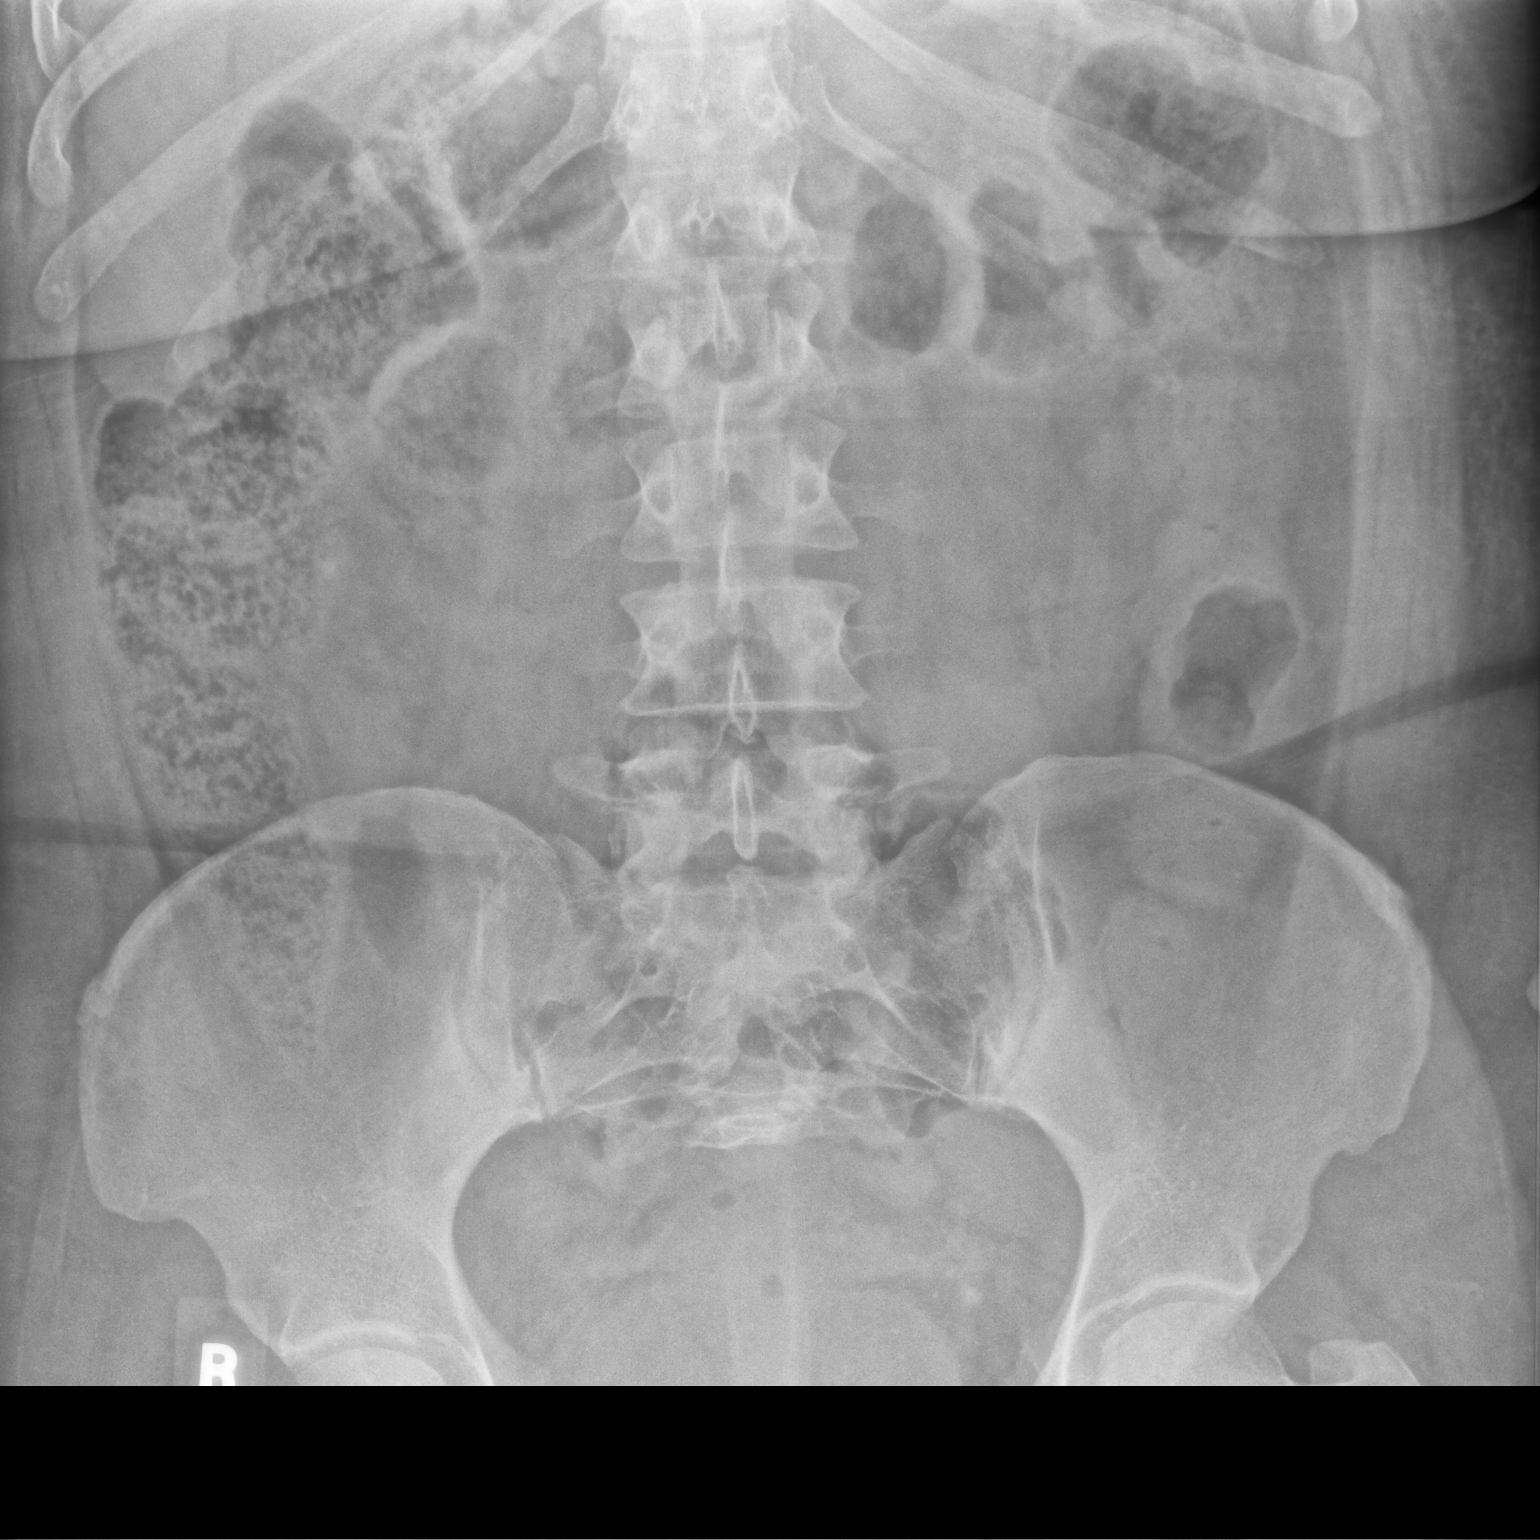

[1 of 1 positions shown; findings below may reference images not displayed]

FINDINGS: The bowel gas pattern is normal. No radio-opaque calculi or other
significant radiographic abnormality are seen.
IMPRESSION: Negative.

## 2022-10-09 ENCOUNTER — Other Ambulatory Visit (HOSPITAL_COMMUNITY): Payer: Self-pay

## 2022-10-09 ENCOUNTER — Other Ambulatory Visit: Payer: Self-pay

## 2022-11-12 ENCOUNTER — Other Ambulatory Visit: Payer: Self-pay

## 2022-11-12 ENCOUNTER — Other Ambulatory Visit: Payer: Self-pay | Admitting: Family Medicine

## 2022-11-12 NOTE — Telephone Encounter (Signed)
LOV: 05/07/2022  Lat Refill Date: 05/07/2022  Qty:0  Refills:0

## 2022-11-15 ENCOUNTER — Other Ambulatory Visit (HOSPITAL_COMMUNITY): Payer: Self-pay

## 2022-11-17 ENCOUNTER — Other Ambulatory Visit (HOSPITAL_COMMUNITY): Payer: Self-pay

## 2022-11-26 ENCOUNTER — Other Ambulatory Visit: Payer: Self-pay | Admitting: Family Medicine

## 2022-11-26 NOTE — Telephone Encounter (Signed)
Please notify pt; can't refill add meds; last visit was in November. Needs to be seen q 6 months for controlled add meds.  Also overdue for cpe which was due in may. Thanks.

## 2022-11-27 ENCOUNTER — Other Ambulatory Visit (HOSPITAL_COMMUNITY): Payer: Self-pay

## 2022-12-01 ENCOUNTER — Other Ambulatory Visit (HOSPITAL_COMMUNITY): Payer: Self-pay

## 2022-12-01 ENCOUNTER — Encounter: Payer: Self-pay | Admitting: Family Medicine

## 2022-12-01 ENCOUNTER — Ambulatory Visit: Payer: Commercial Managed Care - PPO | Admitting: Family Medicine

## 2022-12-01 VITALS — BP 100/60 | HR 88 | Temp 98.5°F | Ht 63.0 in | Wt 272.6 lb

## 2022-12-01 DIAGNOSIS — F331 Major depressive disorder, recurrent, moderate: Secondary | ICD-10-CM

## 2022-12-01 DIAGNOSIS — F988 Other specified behavioral and emotional disorders with onset usually occurring in childhood and adolescence: Secondary | ICD-10-CM | POA: Diagnosis not present

## 2022-12-01 DIAGNOSIS — R0683 Snoring: Secondary | ICD-10-CM

## 2022-12-01 DIAGNOSIS — R5383 Other fatigue: Secondary | ICD-10-CM

## 2022-12-01 MED ORDER — LISDEXAMFETAMINE DIMESYLATE 40 MG PO CAPS
40.0000 mg | ORAL_CAPSULE | Freq: Every day | ORAL | 0 refills | Status: DC
  Filled 2023-01-01: qty 30, 30d supply, fill #0

## 2022-12-01 MED ORDER — LISDEXAMFETAMINE DIMESYLATE 40 MG PO CAPS
40.0000 mg | ORAL_CAPSULE | Freq: Every day | ORAL | 0 refills | Status: DC
  Filled 2022-12-01: qty 30, 30d supply, fill #0

## 2022-12-01 MED ORDER — LISDEXAMFETAMINE DIMESYLATE 40 MG PO CAPS
40.0000 mg | ORAL_CAPSULE | Freq: Every day | ORAL | 0 refills | Status: DC
  Filled 2023-02-06: qty 30, 30d supply, fill #0

## 2022-12-01 NOTE — Progress Notes (Signed)
Subjective  CC:  Chief Complaint  Patient presents with   ADHD    HPI: [name] is a 42 y.o. female who presents to the office today to address the problems listed above in the chief complaint.  ADD/ADHD  follow up:  She is taking medication as directed and continues to feel it is beneficial. The medications continue to help with focus and attention and task completion. She denies adverse side effects; specifically no headaches, appetite suppression, weight loss, sleeping difficulty, heart palpitations, chest pain or significant weight changes.  However, still feels overwhelmed and struggles at times.  When she gets overwhelmed, she shuts down.  She thinks this is her ADD however she has had depression in the past as well.  She remains on Wellbutrin and Lexapro.  She does not feel down or depressed. Still chronically fatigued.  Snores.  Has never been tested for sleep apnea. Wt Readings from Last 3 Encounters:  12/01/22 272 lb 9.6 oz (123.7 kg)  05/07/22 270 lb 6.4 oz (122.7 kg)  03/03/22 260 lb 3.2 oz (118 kg)    Assessment  1. Attention deficit disorder (ADD) without hyperactivity   2. Snoring   3. Morbid obesity (HCC)   4. Other fatigue   5. Moderate recurrent major depression (HCC)      Plan  ADD: Fairly well-controlled but will increase Vyvanse dose from 30 mg to 40 mg to see if we can get better control of her symptoms. Depression: Continue on current medications. Fatigue and snoring: Refer to pulmonology to evaluate for sleep apnea.  I reviewed patient's records from the PMP aware controlled substance registry today.    Follow up: For complete physical Orders Placed This Encounter  Procedures   Ambulatory referral to Pulmonology   Meds ordered this encounter  Medications   lisdexamfetamine (VYVANSE) 40 MG capsule    Sig: Take 1 capsule (40 mg total) by mouth daily.    Dispense:  30 capsule    Refill:  0   lisdexamfetamine (VYVANSE) 40 MG capsule     Sig: Take 1 capsule (40 mg total) by mouth daily.    Dispense:  30 capsule    Refill:  0   lisdexamfetamine (VYVANSE) 40 MG capsule    Sig: Take 1 capsule (40 mg total) by mouth daily.    Dispense:  30 capsule    Refill:  0      I reviewed the patients updated PMH, FH, and SocHx.    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   escitalopram (LEXAPRO) 20 MG tablet Take 1 tablet by mouth daily.   [DISCONTINUED] lisdexamfetamine (VYVANSE) 30 MG capsule Take 1 capsule by mouth daily.   [DISCONTINUED] lisdexamfetamine (VYVANSE) 30 MG capsule Take 1 capsule by mouth daily.   [DISCONTINUED] lisdexamfetamine (VYVANSE) 30 MG capsule Take 1 capsule (30 mg total) by mouth daily.    Allergies:  Patient is allergic to codeine. Family History: Patient family history includes ADD / ADHD in her son; Anemia in her mother; Anxiety disorder in her mother and son; Cancer in her maternal grandmother; Depression in her mother; GER disease in her mother; Heart attack in her father; ODD in her son; Peripheral vascular disease in her mother; Varicose Veins in her mother. Social History:  Patient  reports that she has never smoked. She has never used smokeless tobacco. She reports current alcohol use. She reports that she does not use drugs.  Review of Systems: Constitutional:  Negative for fever malaise or anorexia Cardiovascular: negative for chest pain Respiratory: negative for SOB or persistent cough Gastrointestinal: negative for abdominal pain  Objective  Vitals: BP 100/60   Pulse 88   Temp 98.5 F (36.9 C)   Ht 5\' 3"  (1.6 m)   Wt 272 lb 9.6 oz (123.7 kg)   SpO2 97%   BMI 48.29 kg/m  General: no acute distress , A&Ox3   Commons side effects, risks, benefits, and alternatives for medications and treatment plan prescribed today were discussed, and the patient expressed understanding of the given instructions. Patient is instructed to call or message via MyChart if he/she has any questions or concerns regarding our treatment plan. No barriers to understanding were identified. We discussed Red Flag symptoms and signs in detail. Patient expressed understanding regarding what to do in case of urgent or emergency type symptoms.  Medication list was reconciled, printed and provided to the patient in AVS. Patient instructions and summary information was reviewed with the patient as documented in the AVS. This note was prepared with assistance of Dragon voice recognition software. Occasional wrong-word or sound-a-like substitutions may have occurred due to the inherent limitations of voice recognition software

## 2022-12-05 ENCOUNTER — Other Ambulatory Visit (HOSPITAL_COMMUNITY): Payer: Self-pay

## 2022-12-05 ENCOUNTER — Other Ambulatory Visit: Payer: Self-pay

## 2022-12-06 ENCOUNTER — Other Ambulatory Visit (HOSPITAL_COMMUNITY): Payer: Self-pay

## 2022-12-18 ENCOUNTER — Other Ambulatory Visit (HOSPITAL_COMMUNITY): Payer: Self-pay

## 2022-12-31 ENCOUNTER — Other Ambulatory Visit: Payer: Self-pay | Admitting: Family Medicine

## 2022-12-31 NOTE — Telephone Encounter (Signed)
LOV: 12/01/2022  Last Fill Date: 12/01/2022  Qty: 30  Refills: 0

## 2023-01-01 ENCOUNTER — Other Ambulatory Visit (HOSPITAL_COMMUNITY): Payer: Self-pay

## 2023-01-02 ENCOUNTER — Other Ambulatory Visit (HOSPITAL_COMMUNITY): Payer: Self-pay

## 2023-01-07 ENCOUNTER — Encounter (INDEPENDENT_AMBULATORY_CARE_PROVIDER_SITE_OTHER): Payer: Self-pay

## 2023-01-23 ENCOUNTER — Other Ambulatory Visit: Payer: Self-pay

## 2023-01-23 ENCOUNTER — Ambulatory Visit (INDEPENDENT_AMBULATORY_CARE_PROVIDER_SITE_OTHER): Payer: Commercial Managed Care - PPO | Admitting: Family Medicine

## 2023-01-23 ENCOUNTER — Other Ambulatory Visit (HOSPITAL_COMMUNITY): Payer: Self-pay

## 2023-01-23 ENCOUNTER — Encounter: Payer: Self-pay | Admitting: Family Medicine

## 2023-01-23 ENCOUNTER — Other Ambulatory Visit: Payer: Self-pay | Admitting: *Deleted

## 2023-01-23 VITALS — BP 110/74 | HR 89 | Temp 97.8°F | Ht 63.0 in | Wt 269.8 lb

## 2023-01-23 DIAGNOSIS — Z6841 Body Mass Index (BMI) 40.0 and over, adult: Secondary | ICD-10-CM

## 2023-01-23 DIAGNOSIS — Z0000 Encounter for general adult medical examination without abnormal findings: Secondary | ICD-10-CM

## 2023-01-23 DIAGNOSIS — K8020 Calculus of gallbladder without cholecystitis without obstruction: Secondary | ICD-10-CM

## 2023-01-23 DIAGNOSIS — G250 Essential tremor: Secondary | ICD-10-CM | POA: Insufficient documentation

## 2023-01-23 DIAGNOSIS — F331 Major depressive disorder, recurrent, moderate: Secondary | ICD-10-CM | POA: Diagnosis not present

## 2023-01-23 DIAGNOSIS — I351 Nonrheumatic aortic (valve) insufficiency: Secondary | ICD-10-CM | POA: Diagnosis not present

## 2023-01-23 DIAGNOSIS — F988 Other specified behavioral and emotional disorders with onset usually occurring in childhood and adolescence: Secondary | ICD-10-CM

## 2023-01-23 DIAGNOSIS — Q761 Klippel-Feil syndrome: Secondary | ICD-10-CM

## 2023-01-23 LAB — COMPREHENSIVE METABOLIC PANEL
ALT: 14 U/L (ref 0–35)
AST: 14 U/L (ref 0–37)
Albumin: 4 g/dL (ref 3.5–5.2)
Alkaline Phosphatase: 78 U/L (ref 39–117)
BUN: 17 mg/dL (ref 6–23)
CO2: 25 mEq/L (ref 19–32)
Calcium: 8.6 mg/dL (ref 8.4–10.5)
Chloride: 101 mEq/L (ref 96–112)
Creatinine, Ser: 0.74 mg/dL (ref 0.40–1.20)
GFR: 101.04 mL/min (ref >60.00)
Glucose, Bld: 86 mg/dL (ref 70–99)
Potassium: 4.1 mEq/L (ref 3.5–5.1)
Sodium: 134 mEq/L — ABNORMAL LOW (ref 135–145)
Total Bilirubin: 0.5 mg/dL (ref 0.2–1.2)
Total Protein: 7.3 g/dL (ref 6.0–8.3)

## 2023-01-23 LAB — LIPID PANEL
Cholesterol: 159 mg/dL (ref 0–200)
HDL: 42.6 mg/dL (ref >39.00)
LDL Cholesterol: 92 mg/dL (ref 0–99)
NonHDL: 116.69
Total CHOL/HDL Ratio: 4
Triglycerides: 121 mg/dL (ref 0.0–149.0)
VLDL: 24.2 mg/dL (ref 0.0–40.0)

## 2023-01-23 LAB — CBC WITH DIFFERENTIAL/PLATELET
Basophils Absolute: 0 10*3/uL (ref 0.0–0.1)
Basophils Relative: 0.5 % (ref 0.0–3.0)
Eosinophils Absolute: 0 10*3/uL (ref 0.0–0.7)
Eosinophils Relative: 0.5 % (ref 0.0–5.0)
HCT: 34.8 % — ABNORMAL LOW (ref 36.0–46.0)
Hemoglobin: 11.1 g/dL — ABNORMAL LOW (ref 12.0–15.0)
Lymphocytes Relative: 23.9 % (ref 12.0–46.0)
Lymphs Abs: 2.2 10*3/uL (ref 0.7–4.0)
MCHC: 31.9 g/dL (ref 30.0–36.0)
MCV: 82.9 fl (ref 78.0–100.0)
Monocytes Absolute: 0.4 10*3/uL (ref 0.1–1.0)
Monocytes Relative: 4.8 % (ref 3.0–12.0)
Neutro Abs: 6.4 10*3/uL (ref 1.4–7.7)
Neutrophils Relative %: 70.3 % (ref 43.0–77.0)
Platelets: 301 10*3/uL (ref 150.0–400.0)
RBC: 4.2 Mil/uL (ref 3.87–5.11)
RDW: 16.2 % — ABNORMAL HIGH (ref 11.5–15.5)
WBC: 9.1 10*3/uL (ref 4.0–10.5)

## 2023-01-23 LAB — VITAMIN D 25 HYDROXY (VIT D DEFICIENCY, FRACTURES): VITD: 11.98 ng/mL — ABNORMAL LOW (ref 30.00–100.00)

## 2023-01-23 LAB — TSH: TSH: 2.76 u[IU]/mL (ref 0.35–5.50)

## 2023-01-23 MED ORDER — LISDEXAMFETAMINE DIMESYLATE 40 MG PO CAPS: 40.0000 mg | ORAL_CAPSULE | Freq: Every day | ORAL | 0 refills | Status: DC

## 2023-01-23 NOTE — Addendum Note (Signed)
Addended by: Lorn Junes on: 01/23/2023 02:25 PM   Modules accepted: Orders

## 2023-01-23 NOTE — Progress Notes (Signed)
Subjective  Chief Complaint  Patient presents with   Annual Exam    HPI: [name] is a 42 y.o. female who presents to [facility] [place] today for a Female Wellness Visit.  She also has the concerns and/or needs as listed above in the chief complaint. These will be addressed in addition to the Health Maintenance Visit.   Wellness Visit: annual visit with health maintenance review and exam without Pap  HM: Gyn care current. Normal pap. Home with 3 boys during summer. Hasn't been eating as well. Weight is up. Hoping to be able to improve that once school starts back. Imms current. Fasting. Chronic disease management visit and/or acute problem visit: ADD is well controlled. I reviewed patient's records from the PMP aware controlled substance registry today.  Depression remains mildly active. Left sided temporal headache w/o red flag sxs. On meds. Feels it is stable and she is coping fairly well. No new sxs No light headedness or sob Choleilithiasis remains asymptomatic.  Assessment  1. Annual physical exam   2. Moderate recurrent major depression (HCC)   3. Attention deficit disorder (ADD) without hyperactivity   4. Nonrheumatic aortic valve insufficiency   5. Morbid obesity (HCC)   6. Klippel-Feil syndrome   7. Calculus of gallbladder without cholecystitis without obstruction   8. Essential tremor      Plan  Female Wellness Visit: Age appropriate Health Maintenance and Prevention measures were discussed with patient. Included topics are cancer screening recommendations, ways to keep healthy (see AVS) including dietary and exercise recommendations, regular eye and dental care, use of seat belts, and avoidance of moderate alcohol use and tobacco use. Screens are curent BMI: discussed patient's BMI and encouraged positive lifestyle modifications to help get to or maintain a target BMI. HM needs and immunizations were addressed and ordered. See below for  orders. See HM and immunization section for updates. Routine labs and screening tests ordered including cmp, cbc and lipids where appropriate. Discussed recommendations regarding Vit D and calcium supplementation (see AVS)  Chronic disease f/u and/or acute problem visit: (deemed necessary to be done in addition to the wellness visit): ADD:  controlled. Appropriate med use. Refilled x 3 months. Recheck in 6 months vyvanse 40 daily Depression: at risk but stable on meds. Discussed mgt. Continue wellbutrin xl 150 and lexapro 20 Weight gain: encouraged to improve diet and restart exercise.  AI: remains asymptomatic Asymptomatic gall stones. Continue to monitor and check lfts  Follow up: 6 mo for add  Orders Placed This Encounter  Procedures   VITAMIN D 25 Hydroxy (Vit-D Deficiency, Fractures)   CBC with Differential/Platelet   Comprehensive metabolic panel   Lipid panel   TSH   Meds ordered this encounter  Medications   lisdexamfetamine (VYVANSE) 40 MG capsule    Sig: Take 1 capsule (40 mg) by mouth daily.    Dispense:  30 capsule    Refill:  0   lisdexamfetamine (VYVANSE) 40 MG capsule    Sig: Take 1 capsule (40 mg) by mouth daily.    Dispense:  30 capsule    Refill:  0   lisdexamfetamine (VYVANSE) 40 MG capsule    Sig: Take 1 capsule (40 mg total) by mouth daily.    Dispense:  30 capsule    Refill:  0       Body mass index is 47.79 kg/m. 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 She is asymptomatic    Essential tremor 01/23/2023    Morbid obesity (HCC) 05/16/2021    Health Maintenance  Topic Date Due   COVID-19 Vaccine (4 - 2023-24 season) 02/08/2023 (Originally 02/07/2022)   INFLUENZA VACCINE  03/24/2023 (Originally 01/08/2023)   DTaP/Tdap/Td (2 - Td or Tdap) 03/24/2027   PAP SMEAR-Modifier  05/24/2027   Hepatitis C Screening  Completed   HIV Screening  Completed   HPV VACCINES  Aged Out   Immunization History  Administered Date(s) Administered   Influenza Split 07/30/2012   Influenza,inj,Quad PF,6+ Mos 04/13/2020, 05/16/2021, 03/03/2022   Moderna Sars-Covid-2 Vaccination 05/25/2020   PFIZER(Purple Top)SARS-COV-2 Vaccination 09/08/2019, 10/03/2019   Tdap 03/23/2017   We updated and reviewed the patient's past history in detail and it is documented below. Allergies: Patient  reports current alcohol use. Past Medical History Patient  has a past medical history of Anemia, Anti-Duffy antibodies present, Deafness, Deafness in left ear, Depression, ELECTROCARDIOGRAM, ABNORMAL (12/28/2008), Fractured coccyx (HCC), GERD (gastroesophageal reflux disease), Heart murmur, History of postpartum depression (03/23/2018), varicella, Personal history of congenital hip dysplasia (03/23/2018), and Subaortic stenosis. Past Surgical History Patient  has a past surgical history that includes Cardiac surgery; Cesarean section; Cesarean section (N/A, 07/28/2012); Cesarean section (N/A, 03/26/2017); and  Eye surgery. Social History   Socioeconomic History   Marital status: Married    Spouse name: jeff   Number of children: 3   Years of education: Not on file   Highest education level: Bachelor's degree (e.g., BA, AB, BS)  Occupational History   Not on file  Tobacco Use   Smoking status: Never   Smokeless tobacco: Never  Vaping Use   Vaping status: Never Used  Substance and Sexual Activity   Alcohol use: Yes    Comment: 1 q mth   Drug use: No   Sexual activity: Yes    Birth control/protection: Condom  Other Topics Concern   Not on file  Social History Narrative   Not on file   Social Determinants of Health   Financial Resource Strain: Low Risk  (11/30/2022)   Overall Financial Resource Strain (CARDIA)    Difficulty of Paying Living Expenses: Not very hard  Food Insecurity: No Food Insecurity (11/30/2022)   Hunger Vital Sign    Worried About Running Out of Food in the Last Year: Never true    Ran Out of Food in the Last Year:  Never true  Transportation Needs: No Transportation Needs (11/30/2022)   PRAPARE - Administrator, Civil Service (Medical): No    Lack of Transportation (Non-Medical): No  Physical Activity: Insufficiently Active (11/30/2022)   Exercise Vital Sign    Days of Exercise per Week: 1 day    Minutes of Exercise per Session: 30 min  Stress: No Stress Concern Present (11/30/2022)   [employer] of Occupational Health - Occupational Stress Questionnaire    Feeling of Stress : Not at all  Social Connections: Moderately Isolated (11/30/2022)   Social Connection and Isolation Panel [NHANES]    Frequency of Communication with Friends and Family: Twice a week    Frequency of Social Gatherings with Friends and Family: Once a week    Attends Religious Services: Never    Database administrator or Organizations: No    Attends Engineer, structural: Not on file    Marital Status: Married   Family History  Problem Relation Age of Onset    Peripheral vascular disease Mother    Varicose Veins Mother    Anemia Mother    Depression Mother    Anxiety disorder Mother    GER disease Mother    Cancer Maternal Grandmother        breast   Heart attack Father    Anxiety disorder Son    ODD Son    ADD / ADHD Son     Review of Systems: Constitutional: negative for fever or malaise Ophthalmic: negative for photophobia, double vision or loss of vision Cardiovascular: negative for chest pain, dyspnea on exertion, or new LE swelling Respiratory: negative for SOB or persistent cough Gastrointestinal: negative for abdominal pain, change in bowel habits or melena Genitourinary: negative for dysuria or gross hematuria, no abnormal uterine bleeding or disharge Musculoskeletal: negative for new gait disturbance or muscular weakness Integumentary: negative for new or persistent rashes, no breast lumps Neurological: negative for TIA or stroke symptoms Psychiatric: negative for SI or delusions Allergic/Immunologic: negative for hives  Patient Care Team    Relationship Specialty Notifications Start End  Willow Ora, MD PCP - General Family Medicine  03/23/18   Olivia Mackie, MD Consulting Physician Obstetrics and Gynecology  03/23/18   Alfredo Martinez, MD Consulting Physician Urology  06/23/18     Objective  Vitals: BP 110/74   Pulse 89   Temp 97.8 F (36.6 C)   Ht 5\' 3"  (1.6 m)   Wt 269 lb 12.8 oz (122.4 kg)   SpO2 95%   BMI 47.79 kg/m  General:  Well developed, well nourished, no acute distress  Psych:  Alert and orientedx3,normal mood and affect HEENT:  Normocephalic, atraumatic, non-icteric sclera, PERRL, supple neck without adenopathy, mass or thyromegaly Cardiovascular:  Normal S1, S2, RRR without gallop, rub or murmur Respiratory:  Good breath sounds bilaterally, CTAB with normal respiratory effort Gastrointestinal: normal bowel sounds, soft, non-tender, no noted masses. No HSM MSK: no deformities, contusions.  Joints are without erythema or swelling.  Skin:  Warm, no rashes or suspicious lesions noted Neurologic:    Mental status is normal. Gross motor and sensory exams are normal. Normal gait. Mild essential  tremor    Commons side effects, risks, benefits, and alternatives for medications and treatment plan prescribed today were discussed, and the patient expressed understanding of the given instructions. Patient is instructed to call or message via MyChart if he/she has any questions or concerns regarding our treatment plan. No barriers to understanding were  identified. We discussed Red Flag symptoms and signs in detail. Patient expressed understanding regarding what to do in case of urgent or emergency type symptoms.  Medication list was reconciled, printed and provided to the patient in AVS. Patient instructions and summary information was reviewed with the patient as documented in the AVS. This note was prepared with assistance of Dragon voice recognition software. Occasional wrong-word or sound-a-like substitutions may have occurred due to the inherent limitations of voice recognition software .

## 2023-01-23 NOTE — Addendum Note (Signed)
Addended by: Lorn Junes on: 01/23/2023 02:29 PM   Modules accepted: Orders

## 2023-01-23 NOTE — Addendum Note (Signed)
Addended by: Lorn Junes on: 01/23/2023 11:30 AM   Modules accepted: Orders

## 2023-02-02 ENCOUNTER — Other Ambulatory Visit: Payer: Self-pay | Admitting: Family Medicine

## 2023-02-02 ENCOUNTER — Other Ambulatory Visit: Payer: Self-pay

## 2023-02-06 ENCOUNTER — Other Ambulatory Visit (HOSPITAL_COMMUNITY): Payer: Self-pay

## 2023-02-07 ENCOUNTER — Other Ambulatory Visit (HOSPITAL_COMMUNITY): Payer: Self-pay

## 2023-03-09 ENCOUNTER — Other Ambulatory Visit: Payer: Self-pay

## 2023-03-09 ENCOUNTER — Other Ambulatory Visit (HOSPITAL_COMMUNITY): Payer: Self-pay

## 2023-03-09 ENCOUNTER — Other Ambulatory Visit: Payer: Self-pay | Admitting: Family Medicine

## 2023-03-09 MED ORDER — LISDEXAMFETAMINE DIMESYLATE 40 MG PO CAPS
40.0000 mg | ORAL_CAPSULE | Freq: Every day | ORAL | 0 refills | Status: DC
Start: 2023-03-09 — End: 2023-04-07
  Filled 2023-03-09: qty 30, 30d supply, fill #0

## 2023-03-10 ENCOUNTER — Other Ambulatory Visit (HOSPITAL_COMMUNITY): Payer: Self-pay

## 2023-04-07 ENCOUNTER — Other Ambulatory Visit: Payer: Self-pay | Admitting: Family Medicine

## 2023-04-08 ENCOUNTER — Other Ambulatory Visit (HOSPITAL_COMMUNITY): Payer: Self-pay

## 2023-04-08 ENCOUNTER — Other Ambulatory Visit: Payer: Self-pay

## 2023-04-08 MED ORDER — LISDEXAMFETAMINE DIMESYLATE 40 MG PO CAPS
40.0000 mg | ORAL_CAPSULE | Freq: Every day | ORAL | 0 refills | Status: DC
  Filled 2023-04-08 – 2023-04-13 (×2): qty 30, 30d supply, fill #0

## 2023-04-09 ENCOUNTER — Other Ambulatory Visit (HOSPITAL_COMMUNITY): Payer: Self-pay

## 2023-04-13 ENCOUNTER — Other Ambulatory Visit (HOSPITAL_COMMUNITY): Payer: Self-pay

## 2023-05-14 ENCOUNTER — Other Ambulatory Visit: Payer: Self-pay

## 2023-05-14 ENCOUNTER — Other Ambulatory Visit (HOSPITAL_COMMUNITY): Payer: Self-pay

## 2023-05-14 ENCOUNTER — Other Ambulatory Visit: Payer: Self-pay | Admitting: Family Medicine

## 2023-05-14 MED ORDER — LISDEXAMFETAMINE DIMESYLATE 40 MG PO CAPS
40.0000 mg | ORAL_CAPSULE | Freq: Every day | ORAL | 0 refills | Status: DC
  Filled 2023-05-14: qty 30, 30d supply, fill #0

## 2023-05-14 MED ORDER — BUPROPION HCL ER (XL) 150 MG PO TB24
150.0000 mg | ORAL_TABLET | Freq: Every day | ORAL | 3 refills | Status: DC
  Filled 2023-05-14: qty 30, 30d supply, fill #0
  Filled 2023-06-16 – 2023-06-24 (×2): qty 30, 30d supply, fill #1
  Filled 2023-08-12: qty 30, 30d supply, fill #2
  Filled 2023-09-25: qty 30, 30d supply, fill #3
  Filled 2023-11-01: qty 30, 30d supply, fill #4
  Filled 2023-12-15: qty 30, 30d supply, fill #5
  Filled 2024-01-27: qty 30, 30d supply, fill #6
  Filled 2024-03-01 – 2024-03-22 (×2): qty 30, 30d supply, fill #7

## 2023-05-27 ENCOUNTER — Other Ambulatory Visit (HOSPITAL_COMMUNITY): Payer: Self-pay

## 2023-06-01 ENCOUNTER — Other Ambulatory Visit: Payer: Self-pay

## 2023-06-16 ENCOUNTER — Other Ambulatory Visit: Payer: Self-pay | Admitting: Family Medicine

## 2023-06-17 ENCOUNTER — Other Ambulatory Visit: Payer: Self-pay

## 2023-06-17 ENCOUNTER — Other Ambulatory Visit (HOSPITAL_COMMUNITY): Payer: Self-pay

## 2023-06-17 MED ORDER — LISDEXAMFETAMINE DIMESYLATE 40 MG PO CAPS
40.0000 mg | ORAL_CAPSULE | Freq: Every day | ORAL | 0 refills | Status: DC
  Filled 2023-06-17 – 2023-06-24 (×2): qty 30, 30d supply, fill #0

## 2023-06-18 ENCOUNTER — Other Ambulatory Visit: Payer: Self-pay

## 2023-06-19 ENCOUNTER — Other Ambulatory Visit: Payer: Self-pay

## 2023-06-23 ENCOUNTER — Other Ambulatory Visit: Payer: Self-pay

## 2023-06-24 ENCOUNTER — Other Ambulatory Visit (HOSPITAL_COMMUNITY): Payer: Self-pay

## 2023-07-28 ENCOUNTER — Other Ambulatory Visit (HOSPITAL_COMMUNITY): Payer: Self-pay

## 2023-07-28 ENCOUNTER — Ambulatory Visit: Payer: Commercial Managed Care - PPO | Admitting: Family Medicine

## 2023-07-28 ENCOUNTER — Other Ambulatory Visit: Payer: Self-pay

## 2023-07-28 ENCOUNTER — Encounter: Payer: Self-pay | Admitting: Family Medicine

## 2023-07-28 VITALS — BP 111/74 | HR 100 | Temp 97.8°F | Ht 63.0 in | Wt 277.6 lb

## 2023-07-28 DIAGNOSIS — F988 Other specified behavioral and emotional disorders with onset usually occurring in childhood and adolescence: Secondary | ICD-10-CM | POA: Diagnosis not present

## 2023-07-28 DIAGNOSIS — F331 Major depressive disorder, recurrent, moderate: Secondary | ICD-10-CM | POA: Diagnosis not present

## 2023-07-28 MED ORDER — LISDEXAMFETAMINE DIMESYLATE 40 MG PO CAPS: 40.0000 mg | ORAL_CAPSULE | Freq: Every day | ORAL | 0 refills | Status: DC

## 2023-07-28 MED ORDER — ESCITALOPRAM OXALATE 20 MG PO TABS
20.0000 mg | ORAL_TABLET | Freq: Every day | ORAL | 3 refills | Status: AC
  Filled 2023-07-28: qty 30, 30d supply, fill #0
  Filled 2023-08-31: qty 30, 30d supply, fill #1
  Filled 2023-09-30: qty 30, 30d supply, fill #2
  Filled 2023-11-01: qty 30, 30d supply, fill #3
  Filled 2023-12-15: qty 30, 30d supply, fill #4
  Filled 2024-01-27: qty 30, 30d supply, fill #5
  Filled 2024-03-01 – 2024-03-22 (×2): qty 30, 30d supply, fill #6
  Filled 2024-05-15: qty 30, 30d supply, fill #7

## 2023-07-28 MED ORDER — LISDEXAMFETAMINE DIMESYLATE 40 MG PO CAPS
40.0000 mg | ORAL_CAPSULE | Freq: Every day | ORAL | 0 refills | Status: DC
  Filled 2023-07-28: qty 30, 30d supply, fill #0

## 2023-07-28 NOTE — Patient Instructions (Signed)
Please return in 6 months for your annual complete physical; please come fasting.   If you have any questions or concerns, please don't hesitate to send me a message via MyChart or call the office at [phone]. Thank you for visiting with us today! It's our pleasure caring for you.  

## 2023-07-28 NOTE — Progress Notes (Signed)
 Subjective  CC:  Chief Complaint  Patient presents with   Depression   ADHD    HPI: [name] is a 43 y.o. female who presents to the office today to address the problems listed above in the chief complaint.  ADD/ADHD  follow up:  She is taking medication as directed and continues to feel it is beneficial. The medications continue to help with focus and attention and task completion. She denies adverse side effects; specifically no headaches, appetite suppression, weight loss, sleeping difficulty, heart palpitations, chest pain or significant weight changes. However, still feels overwhelmed and disorganized much of the time. Has 3 boys: oldest is autistic and middle child is being tested now: she had to commit him in November: a very difficult experience. She is hard on herself. Not currently seeing a therapist.  No longer feeling anxious or depressed on lexapro.   Assessment  1. Attention deficit disorder (ADD) without hyperactivity   2. Moderate recurrent major depression (HCC)   3. Morbid obesity (HCC)      Plan  ADD:  continue vyvanse Mood: improved on lexapro.  Recommend finding a therapist to help navigate her complex world with 3 children, 2 are special needs.  I reviewed patient's records from the PMP aware controlled substance registry today.    Follow up: 6 mo for add and cpe  No orders of the defined types were placed in this encounter.  No orders of the defined types were placed in this encounter.     I reviewed the patients updated PMH, FH, and SocHx.    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   lisdexamfetamine (VYVANSE) 40 MG capsule Take 1 capsule (40 mg total) by mouth daily.   [DISCONTINUED] lisdexamfetamine (VYVANSE) 40 MG capsule Take 1 capsule (40 mg total) by mouth daily.   [DISCONTINUED] lisdexamfetamine (VYVANSE) 40 MG capsule Take 1 capsule (40 mg) by mouth daily.   [DISCONTINUED] lisdexamfetamine (VYVANSE) 40 MG capsule Take 1 capsule (40 mg) by mouth daily.   [DISCONTINUED] lisdexamfetamine (VYVANSE) 40 MG capsule Take 1 capsule (40 mg total) by mouth daily.    Allergies: Patient is allergic to codeine. Family History: Patient family history includes ADD / ADHD in her son; Anemia in her mother; Anxiety disorder in her mother and son; Cancer in her maternal grandmother; Depression in her mother; GER disease in her mother; Heart attack in her father; ODD in her son; Peripheral vascular disease in her mother; Varicose Veins in her mother. Social History:  Patient  reports that  she has never smoked. She has never used smokeless tobacco. She reports current alcohol use. She reports that she does not use drugs.  Review of Systems: Constitutional: Negative for fever malaise or anorexia Cardiovascular: negative for chest pain Respiratory: negative for SOB or persistent cough Gastrointestinal: negative for abdominal pain  Objective  Vitals: BP 111/74   Pulse 100   Temp 97.8 F (36.6 C)   Ht 5\' 3"  (1.6 m)   Wt 277 lb 9.6 oz (125.9 kg)   SpO2 99%   BMI 49.17  kg/m  General: no acute distress , A&Ox3 Psych: normal mood Cardiovascular:  RRR without murmur or gallop.     Commons side effects, risks, benefits, and alternatives for medications and treatment plan prescribed today were discussed, and the patient expressed understanding of the given instructions. Patient is instructed to call or message via MyChart if he/she has any questions or concerns regarding our treatment plan. No barriers to understanding were identified. We discussed Red Flag symptoms and signs in detail. Patient expressed understanding regarding what to do in case of urgent or emergency type symptoms.  Medication list was reconciled, printed and provided to the patient in AVS. Patient instructions and summary information was reviewed with the patient as documented in the AVS. This note was prepared with assistance of Dragon voice recognition software. Occasional wrong-word or sound-a-like substitutions may have occurred due to the inherent limitations of voice recognition software

## 2023-08-31 ENCOUNTER — Other Ambulatory Visit: Payer: Self-pay

## 2023-08-31 ENCOUNTER — Other Ambulatory Visit (HOSPITAL_COMMUNITY): Payer: Self-pay

## 2023-08-31 ENCOUNTER — Other Ambulatory Visit: Payer: Self-pay | Admitting: Family Medicine

## 2023-08-31 MED ORDER — LISDEXAMFETAMINE DIMESYLATE 40 MG PO CAPS
40.0000 mg | ORAL_CAPSULE | Freq: Every day | ORAL | 0 refills | Status: DC
  Filled 2023-08-31: qty 30, 30d supply, fill #0

## 2023-08-31 NOTE — Telephone Encounter (Signed)
 07/28/2023 LOV  07/28/2023 fill date  30/0 refills

## 2023-09-01 ENCOUNTER — Other Ambulatory Visit (HOSPITAL_COMMUNITY): Payer: Self-pay

## 2023-09-30 ENCOUNTER — Other Ambulatory Visit: Payer: Self-pay | Admitting: Family Medicine

## 2023-10-01 ENCOUNTER — Other Ambulatory Visit: Payer: Self-pay

## 2023-10-01 ENCOUNTER — Other Ambulatory Visit (HOSPITAL_COMMUNITY): Payer: Self-pay

## 2023-10-01 MED ORDER — LISDEXAMFETAMINE DIMESYLATE 40 MG PO CAPS
40.0000 mg | ORAL_CAPSULE | Freq: Every day | ORAL | 0 refills | Status: DC
  Filled 2023-10-01: qty 30, 30d supply, fill #0

## 2023-10-01 NOTE — Telephone Encounter (Signed)
 07/28/2023 LOV   08/27/2023 fil date  30/0 refills

## 2023-11-01 ENCOUNTER — Other Ambulatory Visit: Payer: Self-pay | Admitting: Family Medicine

## 2023-11-03 ENCOUNTER — Other Ambulatory Visit: Payer: Self-pay

## 2023-11-03 ENCOUNTER — Other Ambulatory Visit (HOSPITAL_COMMUNITY): Payer: Self-pay

## 2023-11-03 MED ORDER — LISDEXAMFETAMINE DIMESYLATE 40 MG PO CAPS
40.0000 mg | ORAL_CAPSULE | Freq: Every day | ORAL | 0 refills | Status: DC
  Filled 2023-11-03: qty 30, 30d supply, fill #0

## 2023-11-03 NOTE — Telephone Encounter (Signed)
 07/28/2023 lov  08/27/2023  30/0 refills

## 2023-11-04 ENCOUNTER — Other Ambulatory Visit (HOSPITAL_COMMUNITY): Payer: Self-pay

## 2023-12-15 ENCOUNTER — Other Ambulatory Visit (HOSPITAL_COMMUNITY): Payer: Self-pay

## 2023-12-15 ENCOUNTER — Other Ambulatory Visit: Payer: Self-pay

## 2023-12-15 ENCOUNTER — Other Ambulatory Visit: Payer: Self-pay | Admitting: Family Medicine

## 2023-12-15 MED ORDER — LISDEXAMFETAMINE DIMESYLATE 40 MG PO CAPS
40.0000 mg | ORAL_CAPSULE | Freq: Every day | ORAL | 0 refills | Status: DC
  Filled 2023-12-15: qty 30, 30d supply, fill #0

## 2023-12-23 ENCOUNTER — Other Ambulatory Visit (HOSPITAL_COMMUNITY): Payer: Self-pay

## 2023-12-23 MED ORDER — CIPROFLOXACIN-DEXAMETHASONE 0.3-0.1 % OT SUSP
OTIC | 0 refills | Status: DC
  Filled 2023-12-23: qty 7.5, 10d supply, fill #0

## 2023-12-24 ENCOUNTER — Other Ambulatory Visit (HOSPITAL_COMMUNITY): Payer: Self-pay

## 2024-01-16 ENCOUNTER — Other Ambulatory Visit (HOSPITAL_COMMUNITY): Payer: Self-pay

## 2024-01-16 ENCOUNTER — Telehealth: Admitting: Physician Assistant

## 2024-01-16 DIAGNOSIS — H1033 Unspecified acute conjunctivitis, bilateral: Secondary | ICD-10-CM

## 2024-01-16 MED ORDER — POLYMYXIN B-TRIMETHOPRIM 10000-0.1 UNIT/ML-% OP SOLN
1.0000 [drp] | Freq: Four times a day (QID) | OPHTHALMIC | 0 refills | Status: DC
  Filled 2024-01-16: qty 10, 25d supply, fill #0

## 2024-01-16 NOTE — Progress Notes (Signed)
 Virtual Visit Consent   [name], you are scheduled for a virtual visit with a [facility] provider today. Just as with appointments in the office, your consent must be obtained to participate. Your consent will be active for this visit and any virtual visit you may have with one of our providers in the next 365 days. If you have a MyChart account, a copy of this consent can be sent to you electronically.  As this is a virtual visit, video technology does not allow for your provider to perform a traditional examination. This may limit your provider's ability to fully assess your condition. If your provider identifies any concerns that need to be evaluated in person or the need to arrange testing (such as labs, EKG, etc.), we will make arrangements to do so. Although advances in technology are sophisticated, we cannot ensure that it will always work on either your end or our end. If the connection with a video visit is poor, the visit may have to be switched to a telephone visit. With either a video or telephone visit, we are not always able to ensure that we have a secure connection.  By engaging in this virtual visit, you consent to the provision of healthcare and authorize for your insurance to be billed (if applicable) for the services provided during this visit. Depending on your insurance coverage, you may receive a charge related to this service.  I need to obtain your verbal consent now. Are you willing to proceed with your visit today? [name] has provided verbal consent on 01/16/2024 for a virtual visit (video or telephone). [name], [place]  Date: 01/16/2024 10:09 AM   Virtual Visit via Video Note   I, [name], connected with  [name]  ([id number], [birth year]) on 01/16/24 at 10:00 AM EDT by a video-enabled telemedicine application and verified that I am speaking with the correct person using two identifiers.  Location: Patient: Virtual  Visit Location Patient: Home Provider: Virtual Visit Location Provider: Home Office   I discussed the limitations of evaluation and management by telemedicine and the availability of in person appointments. The patient expressed understanding and agreed to proceed.    History of Present Illness: [name] is a 43 y.o. who identifies as a female who was assigned female at birth, and is being seen today for around 1 week of bilateral eye irritation, redness and drainage from the eyes with some matting in the morning. Initially thought was allergy to a new makeup product but she has stopped the product with ongoing symptoms. Denies overt vision changes. Denies fever, chills. Is not a contact lens wearer. 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    Allergies:  Allergies  Allergen Reactions   Codeine Hives   Medications:  Current Outpatient Medications:    trimethoprim -polymyxin b  (POLYTRIM ) ophthalmic solution, Apply 1-2 drops into affected eye QID x 5 days., Disp: 10 mL, Rfl: 0   buPROPion  (  WELLBUTRIN  XL) 150 MG 24 hr tablet, Take 1 tablet (150 mg total) by mouth daily., Disp: 90 tablet, Rfl: 3   escitalopram  (LEXAPRO ) 20 MG tablet, Take 1 tablet (20 mg total) by mouth daily., Disp: 90 tablet, Rfl: 3   lisdexamfetamine (VYVANSE ) 40 MG capsule, Take 1 capsule (40 mg total) by mouth daily., Disp: 30 capsule, Rfl: 0    lisdexamfetamine (VYVANSE ) 40 MG capsule, Take 1 capsule (40 mg total) by mouth daily., Disp: 30 capsule, Rfl: 0   lisdexamfetamine (VYVANSE ) 40 MG capsule, Take 1 capsule (40 mg total) by mouth daily., Disp: 30 capsule, Rfl: 0   lisdexamfetamine (VYVANSE ) 40 MG capsule, Take 1 capsule (40 mg total) by mouth daily., Disp: 30 capsule, Rfl: 0   lisdexamfetamine (VYVANSE ) 40 MG capsule, Take 1 capsule (40 mg total) by mouth daily., Disp: 30 capsule, Rfl: 0   lisdexamfetamine (VYVANSE ) 40 MG capsule, Take 1 capsule (40 mg total) by mouth daily., Disp: 30 capsule, Rfl: 0  Observations/Objective: Patient is well-developed, well-nourished in no acute distress.  Resting comfortably at home.  Head is normocephalic, atraumatic.  No labored breathing. Speech is clear and coherent with logical content.  Patient is alert and oriented at baseline.  Bilateral conjunctival injection noted, R>L with drainage and matting of eyelashes. Pupils are equal and round. EOMI.  Assessment and Plan: 1. Acute bacterial conjunctivitis of both eyes (Primary) - trimethoprim -polymyxin b  (POLYTRIM ) ophthalmic solution; Apply 1-2 drops into affected eye QID x 5 days.  Dispense: 10 mL; Refill: 0  Supportive measures and OTC medications reviewed. Continue warm compresses. Polytrim  per orders. In person follow-up for any non-resolving, new or worsening symptoms despite treatment.   Follow Up Instructions: I discussed the assessment and treatment plan with the patient. The patient was provided an opportunity to ask questions and all were answered. The patient agreed with the plan and demonstrated an understanding of the instructions.  A copy of instructions were sent to the patient via MyChart unless otherwise noted below.   The patient was advised to call back or seek an in-person evaluation if the symptoms worsen or if the condition fails to improve as anticipated.    Kristen Velma Lunger, PA-C

## 2024-01-16 NOTE — Patient Instructions (Signed)
 Rosina JONELLE Bers, thank you for joining [name], PA-C for today's virtual visit.  While this provider is not your primary care provider (PCP), if your PCP is located in our provider database this encounter information will be shared with them immediately following your visit.   A [facility] MyChart account gives you access to today's visit and all your visits, tests, and labs performed at [facility]  click here if you don't have a [facility] MyChart account or go to [email or web address]  Consent: (Patient) [name] provided verbal consent for this virtual visit at the beginning of the encounter.  Current Medications:  Current Outpatient Medications:    buPROPion  (WELLBUTRIN  XL) 150 MG 24 hr tablet, Take 1 tablet (150 mg total) by mouth daily., Disp: 90 tablet, Rfl: 3   ciprofloxacin -dexamethasone  (CIPRODEX ) OTIC suspension, Administer 4 drops into the right ear 2 (two) times a day for 10 days., Disp: 7.5 mL, Rfl: 0   escitalopram  (LEXAPRO ) 20 MG tablet, Take 1 tablet (20 mg total) by mouth daily., Disp: 90 tablet, Rfl: 3   lisdexamfetamine (VYVANSE ) 40 MG capsule, Take 1 capsule (40 mg total) by mouth daily., Disp: 30 capsule, Rfl: 0   lisdexamfetamine (VYVANSE ) 40 MG capsule, Take 1 capsule (40 mg total) by mouth daily., Disp: 30 capsule, Rfl: 0   lisdexamfetamine (VYVANSE ) 40 MG capsule, Take 1 capsule (40 mg total) by mouth daily., Disp: 30 capsule, Rfl: 0   lisdexamfetamine (VYVANSE ) 40 MG capsule, Take 1 capsule (40 mg total) by mouth daily., Disp: 30 capsule, Rfl: 0   lisdexamfetamine (VYVANSE ) 40 MG capsule, Take 1 capsule (40 mg total) by mouth daily., Disp: 30 capsule, Rfl: 0   lisdexamfetamine (VYVANSE ) 40 MG capsule, Take 1 capsule (40 mg total) by mouth daily., Disp: 30 capsule, Rfl: 0   Medications ordered in this encounter:  No orders of the defined types were placed in this encounter.    *If you need refills on other medications  prior to your next appointment, please contact your pharmacy*  Follow-Up: Call back or seek an in-person evaluation if the symptoms worsen or if the condition fails to improve as anticipated.  [facility] [phone]  Other Instructions Please keep hands clean and dry. Avoid rubbing the eyes. Apply warm compresses as discussed. Use the antibiotic drop as directed. If you note any non-resolving, new, or worsening symptoms despite treatment, please seek an in-person evaluation ASAP.   Bacterial Conjunctivitis, Adult Bacterial conjunctivitis is an infection of your conjunctiva. This is the clear membrane that covers the white part of your eye and the inner part of your eyelid. This infection can make your eye: Red or pink. Itchy or irritated. This condition spreads easily from person to person (is contagious) and from one eye to the other eye. What are the causes? This condition is caused by germs (bacteria). You may get the infection if you come into close contact with: A person who has the infection. Items that have germs on them (are contaminated), such as face towels, contact lens solution, or eye makeup. What increases the risk? You are more likely to get this condition if: You have contact with people who have the infection. You wear contact lenses. You have a sinus infection. You have had a recent eye injury or surgery. You have a weak body defense system (immune system). You have dry eyes. What are the signs or symptoms?  Thick, yellowish discharge from the eye. Tearing or watery eyes.  Itchy eyes. Burning feeling in your eyes. Eye redness. Swollen eyelids. Blurred vision. How is this treated?  Antibiotic eye drops or ointment. Antibiotic medicine taken by mouth. This is used for infections that do not get better with drops or ointment or that last more than 10 days. Cool, wet cloths placed on the eyes. Artificial tears used 2-6 times a day. Follow  these instructions at home: Medicines Take or apply your antibiotic medicine as told by your doctor. Do not stop using it even if you start to feel better. Take or apply over-the-counter and prescription medicines only as told by your doctor. Do not touch your eyelid with the eye-drop bottle or the ointment tube. Managing discomfort Wipe any fluid from your eye with a warm, wet washcloth or a cotton ball. Place a clean, cool, wet cloth on your eye. Do this for 10-20 minutes, 3-4 times a day. General instructions Do not wear contacts until the infection is gone. Wear glasses until your doctor says it is okay to wear contacts again. Do not wear eye makeup until the infection is gone. Throw away old eye makeup. Change or wash your pillowcase every day. Do not share towels or washcloths. Wash your hands often with soap and water for at least 20 seconds and especially before touching your face or eyes. Use paper towels to dry your hands. Do not touch or rub your eyes. Do not drive or use heavy machinery if your vision is blurred. Contact a doctor if: You have a fever. You do not get better after 10 days. Get help right away if: You have a fever and your symptoms get worse all of a sudden. You have very bad pain when you move your eye. Your face: Hurts. Is red. Is swollen. You have sudden loss of vision. Summary Bacterial conjunctivitis is an infection of your conjunctiva. This infection spreads easily from person to person. Wash your hands often with soap and water for at least 20 seconds and especially before touching your face or eyes. Use paper towels to dry your hands. Take or apply your antibiotic medicine as told by your doctor. Contact a doctor if you have a fever or you do not get better after 10 days. This information is not intended to replace advice given to you by your health care provider. Make sure you discuss any questions you have with your health care provider. Document  Revised: 09/05/2020 Document Reviewed: 09/05/2020 Elsevier Patient Education  2024 [employer]   If you have been instructed to have an in-person evaluation today at a local Urgent Care facility, please use the link below. It will take you to a list of all of our available [facility], including address, phone number and hours of operation. Please do not delay care.  [facility]  If you or a family member do not have a primary care provider, use the link below to schedule a visit and establish care. When you choose a [facility] primary care physician or advanced practice provider, you gain a long-term partner in health. Find a Primary Care Provider  Learn more about [facility]'s in-office and virtual care options: [facility] Get Care Now

## 2024-01-25 ENCOUNTER — Encounter: Payer: Commercial Managed Care - PPO | Admitting: Family Medicine

## 2024-01-27 ENCOUNTER — Other Ambulatory Visit: Payer: Self-pay | Admitting: Family Medicine

## 2024-01-27 ENCOUNTER — Other Ambulatory Visit (HOSPITAL_COMMUNITY): Payer: Self-pay

## 2024-01-27 ENCOUNTER — Other Ambulatory Visit: Payer: Self-pay

## 2024-01-27 MED ORDER — LISDEXAMFETAMINE DIMESYLATE 40 MG PO CAPS
40.0000 mg | ORAL_CAPSULE | Freq: Every day | ORAL | 0 refills | Status: DC
  Filled 2024-01-27: qty 30, 30d supply, fill #0

## 2024-01-27 NOTE — Telephone Encounter (Signed)
 07/28/2023 LOV  08/27/2023 fill date  30/0 refills

## 2024-02-02 ENCOUNTER — Other Ambulatory Visit (HOSPITAL_COMMUNITY): Payer: Self-pay

## 2024-02-02 ENCOUNTER — Ambulatory Visit: Admitting: Family Medicine

## 2024-02-02 ENCOUNTER — Encounter: Payer: Self-pay | Admitting: Family Medicine

## 2024-02-02 VITALS — BP 123/83 | HR 71 | Temp 97.7°F | Ht 63.0 in | Wt 269.6 lb

## 2024-02-02 DIAGNOSIS — E559 Vitamin D deficiency, unspecified: Secondary | ICD-10-CM | POA: Diagnosis not present

## 2024-02-02 DIAGNOSIS — Z87768 Personal history of other specified (corrected) congenital malformations of integument, limbs and musculoskeletal system: Secondary | ICD-10-CM

## 2024-02-02 DIAGNOSIS — Z0000 Encounter for general adult medical examination without abnormal findings: Secondary | ICD-10-CM | POA: Diagnosis not present

## 2024-02-02 DIAGNOSIS — I351 Nonrheumatic aortic (valve) insufficiency: Secondary | ICD-10-CM

## 2024-02-02 DIAGNOSIS — F988 Other specified behavioral and emotional disorders with onset usually occurring in childhood and adolescence: Secondary | ICD-10-CM

## 2024-02-02 DIAGNOSIS — Q761 Klippel-Feil syndrome: Secondary | ICD-10-CM

## 2024-02-02 DIAGNOSIS — K8020 Calculus of gallbladder without cholecystitis without obstruction: Secondary | ICD-10-CM | POA: Diagnosis not present

## 2024-02-02 DIAGNOSIS — Z6841 Body Mass Index (BMI) 40.0 and over, adult: Secondary | ICD-10-CM | POA: Diagnosis not present

## 2024-02-02 DIAGNOSIS — Z0001 Encounter for general adult medical examination with abnormal findings: Secondary | ICD-10-CM

## 2024-02-02 DIAGNOSIS — Q244 Congenital subaortic stenosis: Secondary | ICD-10-CM

## 2024-02-02 DIAGNOSIS — G250 Essential tremor: Secondary | ICD-10-CM

## 2024-02-02 DIAGNOSIS — F331 Major depressive disorder, recurrent, moderate: Secondary | ICD-10-CM

## 2024-02-02 DIAGNOSIS — Z1231 Encounter for screening mammogram for malignant neoplasm of breast: Secondary | ICD-10-CM

## 2024-02-02 LAB — COMPREHENSIVE METABOLIC PANEL WITH GFR
ALT: 18 U/L (ref 0–35)
AST: 17 U/L (ref 0–37)
Albumin: 3.9 g/dL (ref 3.5–5.2)
Alkaline Phosphatase: 69 U/L (ref 39–117)
BUN: 17 mg/dL (ref 6–23)
CO2: 27 meq/L (ref 19–32)
Calcium: 8.5 mg/dL (ref 8.4–10.5)
Chloride: 102 meq/L (ref 96–112)
Creatinine, Ser: 0.75 mg/dL (ref 0.40–1.20)
GFR: 98.71 mL/min (ref >60.00)
Glucose, Bld: 87 mg/dL (ref 70–99)
Potassium: 3.9 meq/L (ref 3.5–5.1)
Sodium: 138 meq/L (ref 135–145)
Total Bilirubin: 0.3 mg/dL (ref 0.2–1.2)
Total Protein: 7.3 g/dL (ref 6.0–8.3)

## 2024-02-02 LAB — CBC WITH DIFFERENTIAL/PLATELET
Basophils Absolute: 0 K/uL (ref 0.0–0.1)
Basophils Relative: 0.6 % (ref 0.0–3.0)
Eosinophils Absolute: 0.2 K/uL (ref 0.0–0.7)
Eosinophils Relative: 2.4 % (ref 0.0–5.0)
HCT: 33.8 % — ABNORMAL LOW (ref 36.0–46.0)
Hemoglobin: 10.8 g/dL — ABNORMAL LOW (ref 12.0–15.0)
Lymphocytes Relative: 25 % (ref 12.0–46.0)
Lymphs Abs: 1.8 K/uL (ref 0.7–4.0)
MCHC: 32 g/dL (ref 30.0–36.0)
MCV: 81.1 fl (ref 78.0–100.0)
Monocytes Absolute: 0.3 K/uL (ref 0.1–1.0)
Monocytes Relative: 4.4 % (ref 3.0–12.0)
Neutro Abs: 4.9 K/uL (ref 1.4–7.7)
Neutrophils Relative %: 67.6 % (ref 43.0–77.0)
Platelets: 291 K/uL (ref 150.0–400.0)
RBC: 4.16 Mil/uL (ref 3.87–5.11)
RDW: 16.1 % — ABNORMAL HIGH (ref 11.5–15.5)
WBC: 7.2 K/uL (ref 4.0–10.5)

## 2024-02-02 LAB — LIPID PANEL
Cholesterol: 152 mg/dL (ref 0–200)
HDL: 47.5 mg/dL (ref >39.00)
LDL Cholesterol: 85 mg/dL (ref 0–99)
NonHDL: 104.26
Total CHOL/HDL Ratio: 3
Triglycerides: 95 mg/dL (ref 0.0–149.0)
VLDL: 19 mg/dL (ref 0.0–40.0)

## 2024-02-02 LAB — VITAMIN D 25 HYDROXY (VIT D DEFICIENCY, FRACTURES): VITD: 22.06 ng/mL — ABNORMAL LOW (ref 30.00–100.00)

## 2024-02-02 LAB — TSH: TSH: 2.29 u[IU]/mL (ref 0.35–5.50)

## 2024-02-02 MED ORDER — LISDEXAMFETAMINE DIMESYLATE 40 MG PO CAPS: 40.0000 mg | ORAL_CAPSULE | Freq: Every day | ORAL | 0 refills | Status: AC

## 2024-02-02 MED ORDER — LISDEXAMFETAMINE DIMESYLATE 40 MG PO CAPS
40.0000 mg | ORAL_CAPSULE | Freq: Every day | ORAL | 0 refills | Status: DC
  Filled 2024-03-01 – 2024-03-22 (×2): qty 30, 30d supply, fill #0

## 2024-02-02 NOTE — Patient Instructions (Signed)
 Please return in 6 months for ADD follow up   I will release your lab results to you on your MyChart account with further instructions. You may see the results before I do, but when I review them I will send you a message with my report or have my assistant call you if things need to be discussed. Please reply to my message with any questions. Thank you!   Please call the office checked below to schedule your appointment for your mammogram and/or bone density screen (the checked studies were ordered): [x]   Mammogram  []   Bone Density  [x]   [facility]     [address] [place], [place]        [phone]         []   [facility]  [address] [place], [place]  [phone]   If you have any questions or concerns, please don't hesitate to send me a message via MyChart or call the office at [phone]. Thank you for visiting with us  today! It's our pleasure caring for you.

## 2024-02-02 NOTE — Progress Notes (Signed)
 Subjective  Chief Complaint  Patient presents with   Annual Exam    Pt here for Annual Exam and is not currently fasting     HPI: [name] is a 43 y.o. female who presents to [facility] [place] today for a Female Wellness Visit.  She also has the concerns and/or needs as listed above in the chief complaint. These will be addressed in addition to the Health Maintenance Visit.   Wellness Visit: annual visit with health maintenance review and exam HM: pap up to date. Eligible for mammo. Needs new GYN. Hpv and flu and tdap eligible. Doing well overall. Chronic disease management visit and/or acute problem visit: Discussed the use of AI scribe software for clinical note transcription with the patient, who gave verbal consent to proceed.  History of Present Illness [name] is a 43 year old female who presents for a follow-up regarding her mental health and medication management.  She has been experiencing mood fluctuations but finds weekly therapy sessions beneficial for organizing her thoughts and managing stress. She continues to take Vyvanse , Wellbutrin , and Lexapro , although she has been out of Lexapro  and Wellbutrin  for about five days due to misplacing her license, which delayed her ability to refill the prescriptions. Her husband plans to pick up the medications.  She has been experiencing more palpitations recently, which she associates with lack of sleep. She saw a cardiologist within the last year, who found everything stable after conducting an EKG.  She reports significant hearing loss following a severe ear infection in July, which resulted in the growth of granular tissue blocking her eardrum. This required removal by an ENT specialist, but her hearing has not fully returned.  She has lost about ten pounds since her last visit, although she notes some fluctuation due to her menstrual cycle.  She has a history of low vitamin D  levels,  which were noted to be very low last year. She has been taking over-the-counter supplements but has not been consistent recently. She did not receive the prescription-strength vitamin D  that was recommended last year.  She has some scratches and scars on her hands, which she attributes to scratching mosquito bites and interactions with a mean cat. She is concerned about the scarring but notes no signs of infection.  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 She has two other children, one in first grade and one in middle school.   Assessment  1. Encounter for well adult exam with abnormal findings   2. Attention deficit disorder (ADD) without hyperactivity   3. Congenital subvalvular aortic stenosis -repaired   4. Klippel-Feil syndrome   5. Moderate recurrent major depression (HCC)   6. Nonrheumatic aortic valve insufficiency   7. Calculus of gallbladder without cholecystitis without obstruction   8. Personal history of congenital hip dysplasia   9. Essential tremor   10. Morbid obesity (HCC)   11. Vitamin D  deficiency      Plan  Female Wellness Visit: Age appropriate Health Maintenance and Prevention measures were discussed with patient. Included topics are cancer screening recommendations, ways to keep healthy (see AVS) including dietary and exercise recommendations, regular eye and dental care, use of seat belts, and avoidance of moderate alcohol use and tobacco use.  BMI: discussed patient's BMI and encouraged positive lifestyle modifications to help get to or maintain a target BMI. HM needs  and immunizations were addressed and ordered. See below for orders. See HM and immunization section for updates. Routine labs and screening tests ordered including cmp, cbc and lipids where appropriate. Discussed recommendations regarding Vit D and calcium supplementation (see AVS)  Chronic disease f/u and/or acute  problem visit: (deemed necessary to be done in addition to the wellness visit): Assessment and Plan Assessment & Plan Depression Depression managed with medication and therapy. She missed Lexapro  and Wellbutrin  for five days. - Refill Lexapro  and Wellbutrin .  Attention-deficit hyperactivity disorder (ADHD) ADHD well-managed with Vyvanse . She requires a refill. - Refill Vyvanse .  Sensorineural hearing loss, left ear Persistent sensorineural hearing loss in left ear post-infection and granulation tissue removal.  Palpitations Increased palpitations likely due to sleep deprivation. Previous cardiology evaluation normal. - Check thyroid function and blood counts.  Vitamin D  deficiency Previous vitamin D  levels very low. Inconsistent supplement intake. - Check vitamin D  levels. - Consider high-dose vitamin D  if levels are low.  hyperpigmented scarring of hand hyperpigmented scarring on hand from scratching and cat interactions. Not infected, expected to improve. Stop picking - Advise use of Vaseline to deter picking. - Recommend steroid cream for scarring.  Overweight Weight decreased by ten pounds since last visit. Recent slight increase due to menstrual cycle. - Encouraged to continue current weight loss efforts.    Follow up: 6 mo for ADD  Orders Placed This Encounter  Procedures   TSH   VITAMIN D  25 Hydroxy (Vit-D Deficiency, Fractures)   CBC with Differential/Platelet   Comprehensive metabolic panel with GFR   Lipid panel   No orders of the defined types were placed in this encounter.      Body mass index is 47.76 kg/m. Wt Readings from Last 3 Encounters:  02/02/24 269 lb 9.6 oz (122.3 kg)  07/28/23 277 lb 9.6 oz (125.9 kg)  01/23/23 269 lb 12.8 oz (122.4 kg)     Patient Active Problem List   Diagnosis Date Noted   Morbid obesity (HCC) 05/16/2021    Priority: High   Attention deficit disorder (ADD) without hyperactivity 10/29/2018    Priority: High    Moderate recurrent major depression (HCC) 08/09/2018    Priority: High    Wellbutrin  and lexapro     Congenital subvalvular aortic stenosis -repaired 03/26/2017    Priority: High   Klippel-Feil syndrome 11/25/2016    Priority: High   Cholelithiasis 11/03/2019    Priority: Medium    Personal history of congenital hip dysplasia 03/23/2018    Priority: Medium     Treated with [place] harness    History of postpartum depression 03/23/2018    Priority: Medium    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 She is asymptomatic    Essential tremor 01/23/2023    Priority: Low   Health Maintenance  Topic Date Due   Hepatitis B Vaccines 19-59 Average Risk (1 of 3 - 19+ 3-dose series) Never done   HPV VACCINES (1 - 3-dose SCDM series) Never done   INFLUENZA VACCINE  01/08/2024   COVID-19 Vaccine (4 - 2024-25 season) 02/18/2024 (Originally 02/08/2023)   DTaP/Tdap/Td (2 - Td or Tdap) 03/24/2027   Cervical Cancer Screening (HPV/Pap Cotest)  05/24/2027   Hepatitis C Screening  Completed  HIV Screening  Completed   Meningococcal B Vaccine  Aged Out   Pneumococcal Vaccine  Discontinued   Immunization History  Administered Date(s) Administered   Influenza Split 07/30/2012   Influenza,inj,Quad PF,6+ Mos 04/13/2020, 05/16/2021, 03/03/2022, 03/24/2023   Moderna Sars-Covid-2 Vaccination 05/25/2020   PFIZER(Purple Top)SARS-COV-2 Vaccination 09/08/2019, 10/03/2019   Tdap 03/23/2017   We updated and reviewed the patient's past history in detail and it is documented below. Allergies: Patient  reports current alcohol use. Past Medical History Patient  has a past medical history of Anemia, Anti-Duffy antibodies present, Deafness, Deafness in left ear, Depression,  ELECTROCARDIOGRAM, ABNORMAL (12/28/2008), Fractured coccyx (HCC), GERD (gastroesophageal reflux disease), Heart murmur, History of postpartum depression (03/23/2018), varicella, Personal history of congenital hip dysplasia (03/23/2018), and Subaortic stenosis. Past Surgical History Patient  has a past surgical history that includes Cardiac surgery; Cesarean section; Cesarean section (N/A, 07/28/2012); Cesarean section (N/A, 03/26/2017); and Eye surgery. Social History   Socioeconomic History   Marital status: Married    Spouse name: jeff   Number of children: 3   Years of education: Not on file   Highest education level: Bachelor's degree (e.g., BA, AB, BS)  Occupational History   Not on file  Tobacco Use   Smoking status: Never   Smokeless tobacco: Never  Vaping Use   Vaping status: Never Used  Substance and Sexual Activity   Alcohol use: Yes    Comment: 1 q mth   Drug use: No   Sexual activity: Yes    Birth control/protection: Condom  Other Topics Concern   Not on file  Social History Narrative   Not on file   Social Drivers of Health   Financial Resource Strain: Low Risk  (01/31/2024)   Overall Financial Resource Strain (CARDIA)    Difficulty of Paying Living Expenses: Not very hard  Food Insecurity: No Food Insecurity (01/31/2024)   Hunger Vital Sign    Worried About Running Out of Food in the Last Year: Never true    Ran Out of Food in the Last Year: Never true  Transportation Needs: No Transportation Needs (01/31/2024)   PRAPARE - Administrator, Civil Service (Medical): No    Lack of Transportation (Non-Medical): No  Physical Activity: Insufficiently Active (01/31/2024)   Exercise Vital Sign    Days of Exercise per Week: 3 days    Minutes of Exercise per Session: 20 min  Stress: Stress Concern Present (01/31/2024)   [employer] of Occupational Health - Occupational Stress Questionnaire    Feeling of Stress: To some extent  Social Connections:  Moderately Isolated (01/31/2024)   Social Connection and Isolation Panel    Frequency of Communication with Friends and Family: Once a week    Frequency of Social Gatherings with Friends and Family: Once a week    Attends Religious Services: 1 to 4 times per year    Active Member of [employer] or Organizations: No    Attends Engineer, structural: Not on file    Marital Status: Married   Family History  Problem Relation Age of Onset   Peripheral vascular disease Mother    Varicose Veins Mother    Anemia Mother    Depression Mother    Anxiety disorder Mother    GER disease Mother    Cancer Maternal Grandmother        breast   Heart attack Father    Anxiety disorder Son    ODD Son    ADD / ADHD Son  Review of Systems: Constitutional: negative for fever or malaise Ophthalmic: negative for photophobia, double vision or loss of vision Cardiovascular: negative for chest pain, dyspnea on exertion, or new LE swelling Respiratory: negative for SOB or persistent cough Gastrointestinal: negative for abdominal pain, change in bowel habits or melena Genitourinary: negative for dysuria or gross hematuria, no abnormal uterine bleeding or disharge Musculoskeletal: negative for new gait disturbance or muscular weakness Integumentary: negative for new or persistent rashes, no breast lumps Neurological: negative for TIA or stroke symptoms Psychiatric: negative for SI or delusions Allergic/Immunologic: negative for hives  Patient Care Team    Relationship Specialty Notifications Start End  Jodie Lavern CROME, MD PCP - General Family Medicine  03/23/18   Gorge Ade, MD Consulting Physician Obstetrics and Gynecology  03/23/18   Gaston Hamilton, MD Consulting Physician Urology  06/23/18     Objective  Vitals: BP 123/83   Pulse 71   Temp 97.7 F (36.5 C)   Ht 5' 3 (1.6 m)   Wt 269 lb 9.6 oz (122.3 kg)   SpO2 100%   BMI 47.76 kg/m  General:  Well developed, well nourished, no  acute distress  Psych:  Alert and orientedx3,normal mood and affect HEENT:  Normocephalic, atraumatic, non-icteric sclera, PERRL, supple neck without adenopathy, mass or thyromegaly Cardiovascular:  Normal S1, S2, RRR without gallop, rub or murmur Respiratory:  Good breath sounds bilaterally, CTAB with normal respiratory effort Gastrointestinal: normal bowel sounds, soft, non-tender, no noted masses. No HSM MSK: no deformities, contusions. Joints are without erythema or swelling.  Skin:  Warm, no rashes or suspicious lesions noted, pickers wounds w/ some hyperpigmented lesions.  Neurologic:    Mental status is normal. Gross motor and sensory exams are normal. Normal gait. No tremor    Commons side effects, risks, benefits, and alternatives for medications and treatment plan prescribed today were discussed, and the patient expressed understanding of the given instructions. Patient is instructed to call or message via MyChart if he/she has any questions or concerns regarding our treatment plan. No barriers to understanding were identified. We discussed Red Flag symptoms and signs in detail. Patient expressed understanding regarding what to do in case of urgent or emergency type symptoms.  Medication list was reconciled, printed and provided to the patient in AVS. Patient instructions and summary information was reviewed with the patient as documented in the AVS. This note was prepared with assistance of Dragon voice recognition software. Occasional wrong-word or sound-a-like substitutions may have occurred due to the inherent limitations of voice recognition software .

## 2024-02-08 ENCOUNTER — Ambulatory Visit: Payer: Self-pay | Admitting: Family Medicine

## 2024-02-08 NOTE — Progress Notes (Signed)
 Labs reviewed.  The 10-year ASCVD risk score (Arnett DK, et al., 2019) is: 0.4%   Values used to calculate the score:     Age: 43 years     Clincally relevant sex: Female     Is Non-Hispanic African American: No     Diabetic: No     Tobacco smoker: No     Systolic Blood Pressure: 123 mmHg     Is BP treated: No     HDL Cholesterol: 47.5 mg/dL     Total Cholesterol: 152 mg/dL

## 2024-02-19 ENCOUNTER — Ambulatory Visit
Admission: RE | Admit: 2024-02-19 | Discharge: 2024-02-19 | Disposition: A | Discharge status: Home / Self Care | Source: Ambulatory Visit | Attending: Family Medicine | Admitting: Family Medicine

## 2024-02-19 DIAGNOSIS — Z1231 Encounter for screening mammogram for malignant neoplasm of breast: Secondary | ICD-10-CM

## 2024-03-01 ENCOUNTER — Other Ambulatory Visit (HOSPITAL_COMMUNITY): Payer: Self-pay

## 2024-03-01 ENCOUNTER — Other Ambulatory Visit: Payer: Self-pay

## 2024-03-11 ENCOUNTER — Other Ambulatory Visit (HOSPITAL_COMMUNITY): Payer: Self-pay

## 2024-03-22 ENCOUNTER — Other Ambulatory Visit (HOSPITAL_COMMUNITY): Payer: Self-pay

## 2024-05-09 ENCOUNTER — Other Ambulatory Visit (HOSPITAL_COMMUNITY): Payer: Self-pay

## 2024-05-09 MED ORDER — ESTRADIOL 0.01 % VA CREA
TOPICAL_CREAM | VAGINAL | 2 refills | Status: AC
  Filled 2024-05-09 – 2024-05-19 (×2): qty 42.5, 30d supply, fill #0

## 2024-05-15 ENCOUNTER — Other Ambulatory Visit: Payer: Self-pay | Admitting: Family Medicine

## 2024-05-16 ENCOUNTER — Other Ambulatory Visit (HOSPITAL_COMMUNITY): Payer: Self-pay

## 2024-05-16 ENCOUNTER — Other Ambulatory Visit: Payer: Self-pay

## 2024-05-16 MED ORDER — LISDEXAMFETAMINE DIMESYLATE 40 MG PO CAPS
40.0000 mg | ORAL_CAPSULE | Freq: Every day | ORAL | 0 refills | Status: AC
  Filled 2024-05-16: qty 30, 30d supply, fill #0

## 2024-05-16 MED ORDER — BUPROPION HCL ER (XL) 150 MG PO TB24
150.0000 mg | ORAL_TABLET | Freq: Every day | ORAL | 3 refills | Status: AC
  Filled 2024-05-16: qty 30, 30d supply, fill #0

## 2024-05-16 NOTE — Telephone Encounter (Signed)
 02/02/2024 LOV  02/27/2024 fill date  30/0 refills

## 2024-05-19 ENCOUNTER — Other Ambulatory Visit (HOSPITAL_COMMUNITY): Payer: Self-pay

## 2025-02-03 ENCOUNTER — Encounter: Admitting: Family Medicine
